# Patient Record
Sex: Female | Born: 1950
Health system: Southern US, Community
[De-identification: ages and names within clinical notes are randomized; demographics above are authoritative.]

## PROBLEM LIST (undated history)

## (undated) DIAGNOSIS — F99 Mental disorder, not otherwise specified: Secondary | ICD-10-CM

## (undated) DIAGNOSIS — I10 Essential (primary) hypertension: Secondary | ICD-10-CM

## (undated) DIAGNOSIS — F32A Depression, unspecified: Secondary | ICD-10-CM

## (undated) DIAGNOSIS — F329 Major depressive disorder, single episode, unspecified: Secondary | ICD-10-CM

## (undated) DIAGNOSIS — F1910 Other psychoactive substance abuse, uncomplicated: Secondary | ICD-10-CM

## (undated) DIAGNOSIS — B1920 Unspecified viral hepatitis C without hepatic coma: Secondary | ICD-10-CM

## (undated) DIAGNOSIS — F1010 Alcohol abuse, uncomplicated: Secondary | ICD-10-CM

## (undated) HISTORY — PX: TUBAL LIGATION: SHX77

## (undated) HISTORY — PX: CHOLECYSTECTOMY: SHX55

---

## 2004-05-11 ENCOUNTER — Emergency Department (HOSPITAL_COMMUNITY): Admission: EM | Admit: 2004-05-11 | Discharge: 2004-05-11 | Payer: Self-pay | Admitting: Emergency Medicine

## 2004-05-12 ENCOUNTER — Emergency Department (HOSPITAL_COMMUNITY): Admission: EM | Admit: 2004-05-12 | Discharge: 2004-05-12 | Payer: Self-pay | Admitting: Emergency Medicine

## 2004-06-09 ENCOUNTER — Emergency Department (HOSPITAL_COMMUNITY): Admission: EM | Admit: 2004-06-09 | Discharge: 2004-06-09 | Payer: Self-pay | Admitting: Emergency Medicine

## 2004-06-17 ENCOUNTER — Emergency Department (HOSPITAL_COMMUNITY): Admission: EM | Admit: 2004-06-17 | Discharge: 2004-06-17 | Payer: Self-pay | Admitting: *Deleted

## 2004-11-08 ENCOUNTER — Emergency Department (HOSPITAL_COMMUNITY): Admission: EM | Admit: 2004-11-08 | Discharge: 2004-11-08 | Payer: Self-pay | Admitting: Emergency Medicine

## 2004-11-21 ENCOUNTER — Emergency Department (HOSPITAL_COMMUNITY): Admission: EM | Admit: 2004-11-21 | Discharge: 2004-11-21 | Payer: Self-pay | Admitting: Emergency Medicine

## 2005-07-04 ENCOUNTER — Emergency Department (HOSPITAL_COMMUNITY): Admission: EM | Admit: 2005-07-04 | Discharge: 2005-07-04 | Payer: Self-pay | Admitting: Emergency Medicine

## 2005-07-08 ENCOUNTER — Emergency Department (HOSPITAL_COMMUNITY): Admission: EM | Admit: 2005-07-08 | Discharge: 2005-07-08 | Payer: Self-pay | Admitting: *Deleted

## 2005-07-21 ENCOUNTER — Emergency Department (HOSPITAL_COMMUNITY): Admission: EM | Admit: 2005-07-21 | Discharge: 2005-07-21 | Payer: Self-pay | Admitting: Emergency Medicine

## 2005-07-23 ENCOUNTER — Emergency Department (HOSPITAL_COMMUNITY): Admission: EM | Admit: 2005-07-23 | Discharge: 2005-07-23 | Payer: Self-pay | Admitting: Emergency Medicine

## 2005-09-20 ENCOUNTER — Emergency Department (HOSPITAL_COMMUNITY): Admission: EM | Admit: 2005-09-20 | Discharge: 2005-09-21 | Payer: Self-pay | Admitting: Emergency Medicine

## 2008-02-14 ENCOUNTER — Emergency Department (HOSPITAL_COMMUNITY): Admission: EM | Admit: 2008-02-14 | Discharge: 2008-02-14 | Payer: Self-pay | Admitting: Emergency Medicine

## 2008-03-04 ENCOUNTER — Ambulatory Visit: Payer: Self-pay | Admitting: Internal Medicine

## 2008-03-04 LAB — CONVERTED CEMR LAB
ALT: 31 units/L (ref 0–35)
AST: 42 units/L — ABNORMAL HIGH (ref 0–37)
Albumin: 4.1 g/dL (ref 3.5–5.2)
Alkaline Phosphatase: 76 units/L (ref 39–117)
BUN: 9 mg/dL (ref 6–23)
Basophils Absolute: 0 10*3/uL (ref 0.0–0.1)
Basophils Relative: 0 % (ref 0–1)
CO2: 25 meq/L (ref 19–32)
Calcium: 9.5 mg/dL (ref 8.4–10.5)
Chloride: 104 meq/L (ref 96–112)
Creatinine, Ser: 0.67 mg/dL (ref 0.40–1.20)
Eosinophils Absolute: 0.1 10*3/uL (ref 0.0–0.7)
Eosinophils Relative: 1 % (ref 0–5)
Glucose, Bld: 81 mg/dL (ref 70–99)
HCT: 40.9 % (ref 36.0–46.0)
Hemoglobin: 14.1 g/dL (ref 12.0–15.0)
Lymphocytes Relative: 42 % (ref 12–46)
Lymphs Abs: 3.8 10*3/uL (ref 0.7–4.0)
MCHC: 34.5 g/dL (ref 30.0–36.0)
MCV: 93.2 fL (ref 78.0–100.0)
Monocytes Absolute: 0.5 10*3/uL (ref 0.1–1.0)
Monocytes Relative: 5 % (ref 3–12)
Neutro Abs: 4.7 10*3/uL (ref 1.7–7.7)
Neutrophils Relative %: 52 % (ref 43–77)
Platelets: 225 10*3/uL (ref 150–400)
Potassium: 4 meq/L (ref 3.5–5.3)
RBC: 4.39 M/uL (ref 3.87–5.11)
RDW: 14.8 % (ref 11.5–15.5)
Sodium: 138 meq/L (ref 135–145)
TSH: 4.645 microintl units/mL — ABNORMAL HIGH (ref 0.350–4.50)
Total Bilirubin: 0.6 mg/dL (ref 0.3–1.2)
Total Protein: 8.5 g/dL — ABNORMAL HIGH (ref 6.0–8.3)
WBC: 9 10*3/uL (ref 4.0–10.5)

## 2008-03-11 ENCOUNTER — Ambulatory Visit: Payer: Self-pay | Admitting: *Deleted

## 2008-03-17 ENCOUNTER — Ambulatory Visit (HOSPITAL_COMMUNITY): Admission: RE | Admit: 2008-03-17 | Discharge: 2008-03-17 | Payer: Self-pay | Admitting: Internal Medicine

## 2008-03-24 ENCOUNTER — Encounter: Payer: Self-pay | Admitting: Family Medicine

## 2008-03-24 ENCOUNTER — Ambulatory Visit: Payer: Self-pay | Admitting: Internal Medicine

## 2008-03-24 LAB — CONVERTED CEMR LAB
Cholesterol: 135 mg/dL (ref 0–200)
HCV Ab: REACTIVE — AB
HDL: 46 mg/dL (ref >39)
Hep A Total Ab: POSITIVE — AB
Hep B Core Total Ab: POSITIVE — AB
Hep B E Ab: POSITIVE — AB
Hep B S Ab: NEGATIVE
Hepatitis B Surface Ag: NEGATIVE
LDL Cholesterol: 76 mg/dL (ref 0–99)
TSH: 3.164 microintl units/mL (ref 0.350–4.50)
Total CHOL/HDL Ratio: 2.9
Triglycerides: 67 mg/dL (ref <150)
VLDL: 13 mg/dL (ref 0–40)
Vitamin B-12: 992 pg/mL — ABNORMAL HIGH (ref 211–911)

## 2008-04-30 ENCOUNTER — Ambulatory Visit: Payer: Self-pay | Admitting: Family Medicine

## 2009-12-13 ENCOUNTER — Ambulatory Visit: Payer: Self-pay | Admitting: Internal Medicine

## 2009-12-13 LAB — CONVERTED CEMR LAB
BUN: 11 mg/dL (ref 6–23)
CO2: 26 meq/L (ref 19–32)
Calcium: 9.3 mg/dL (ref 8.4–10.5)
Chloride: 106 meq/L (ref 96–112)
Cholesterol: 122 mg/dL (ref 0–200)
Creatinine, Ser: 0.84 mg/dL (ref 0.40–1.20)
Glucose, Bld: 71 mg/dL (ref 70–99)
HDL: 40 mg/dL (ref >39)
LDL Cholesterol: 65 mg/dL (ref 0–99)
Magnesium: 1.8 mg/dL (ref 1.5–2.5)
Pap Smear: NEGATIVE
Potassium: 4.2 meq/L (ref 3.5–5.3)
Sodium: 140 meq/L (ref 135–145)
TSH: 2.678 microintl units/mL (ref 0.350–4.500)
Total CHOL/HDL Ratio: 3.1
Triglycerides: 85 mg/dL (ref <150)
VLDL: 17 mg/dL (ref 0–40)

## 2009-12-26 ENCOUNTER — Ambulatory Visit: Payer: Self-pay | Admitting: Internal Medicine

## 2011-05-24 LAB — DIFFERENTIAL
Basophils Absolute: 0.1
Basophils Relative: 1
Eosinophils Absolute: 0.1
Eosinophils Relative: 1
Lymphocytes Relative: 22
Lymphs Abs: 2.5
Monocytes Absolute: 1
Monocytes Relative: 9
Neutro Abs: 7.6
Neutrophils Relative %: 67

## 2011-05-24 LAB — CBC
HCT: 39.5
Hemoglobin: 13.6
MCHC: 34.3
MCV: 95.9
Platelets: 150
RBC: 4.12
RDW: 13.5
WBC: 11.3 — ABNORMAL HIGH

## 2011-05-24 LAB — URINE MICROSCOPIC-ADD ON

## 2011-05-24 LAB — URINALYSIS, ROUTINE W REFLEX MICROSCOPIC
Bilirubin Urine: NEGATIVE
Glucose, UA: NEGATIVE
Ketones, ur: NEGATIVE
Nitrite: NEGATIVE
Protein, ur: NEGATIVE
Specific Gravity, Urine: 1.011
Urobilinogen, UA: 1
pH: 7

## 2011-05-24 LAB — BASIC METABOLIC PANEL
BUN: 7
CO2: 25
Calcium: 9.5
Chloride: 102
Creatinine, Ser: 0.62
GFR calc Af Amer: >60
GFR calc non Af Amer: >60
Glucose, Bld: 124 — ABNORMAL HIGH
Potassium: 4
Sodium: 138

## 2011-10-08 ENCOUNTER — Encounter (HOSPITAL_COMMUNITY): Payer: Self-pay | Admitting: *Deleted

## 2011-10-08 ENCOUNTER — Emergency Department (HOSPITAL_COMMUNITY)
Admission: EM | Admit: 2011-10-08 | Discharge: 2011-10-08 | Disposition: A | Discharge status: Home / Self Care | Payer: Self-pay | Attending: Emergency Medicine | Admitting: Emergency Medicine

## 2011-10-08 DIAGNOSIS — R1013 Epigastric pain: Secondary | ICD-10-CM | POA: Insufficient documentation

## 2011-10-08 DIAGNOSIS — I10 Essential (primary) hypertension: Secondary | ICD-10-CM | POA: Insufficient documentation

## 2011-10-08 DIAGNOSIS — R531 Weakness: Secondary | ICD-10-CM

## 2011-10-08 DIAGNOSIS — R5381 Other malaise: Secondary | ICD-10-CM | POA: Insufficient documentation

## 2011-10-08 DIAGNOSIS — K5900 Constipation, unspecified: Secondary | ICD-10-CM | POA: Insufficient documentation

## 2011-10-08 DIAGNOSIS — R110 Nausea: Secondary | ICD-10-CM | POA: Insufficient documentation

## 2011-10-08 DIAGNOSIS — R42 Dizziness and giddiness: Secondary | ICD-10-CM | POA: Insufficient documentation

## 2011-10-08 HISTORY — DX: Essential (primary) hypertension: I10

## 2011-10-08 LAB — URINALYSIS, ROUTINE W REFLEX MICROSCOPIC
Glucose, UA: NEGATIVE mg/dL
Hgb urine dipstick: NEGATIVE
Specific Gravity, Urine: 1.007 (ref 1.005–1.030)

## 2011-10-08 LAB — DIFFERENTIAL
Eosinophils Relative: 1 % (ref 0–5)
Lymphocytes Relative: 43 % (ref 12–46)
Lymphs Abs: 3.5 10*3/uL (ref 0.7–4.0)
Monocytes Absolute: 0.6 10*3/uL (ref 0.1–1.0)

## 2011-10-08 LAB — POCT I-STAT, CHEM 8
BUN: 7 mg/dL (ref 6–23)
Calcium, Ion: 1.22 mmol/L (ref 1.12–1.32)
Creatinine, Ser: 0.8 mg/dL (ref 0.50–1.10)
TCO2: 28 mmol/L (ref 0–100)

## 2011-10-08 LAB — CBC
HCT: 36.2 % (ref 36.0–46.0)
MCV: 92.1 fL (ref 78.0–100.0)
RBC: 3.93 MIL/uL (ref 3.87–5.11)
WBC: 8.3 10*3/uL (ref 4.0–10.5)

## 2011-10-08 LAB — URINE MICROSCOPIC-ADD ON

## 2011-10-08 MED ORDER — ONDANSETRON HCL 4 MG/2ML IJ SOLN
4.0000 mg | Freq: Once | INTRAMUSCULAR | Status: AC
  Administered 2011-10-08: 4 mg via INTRAVENOUS
  Filled 2011-10-08: qty 2

## 2011-10-08 MED ORDER — SODIUM CHLORIDE 0.9 % IV BOLUS (SEPSIS)
500.0000 mL | Freq: Once | INTRAVENOUS | Status: AC
  Administered 2011-10-08: 500 mL via INTRAVENOUS

## 2011-10-08 MED ORDER — POTASSIUM CHLORIDE CRYS ER 20 MEQ PO TBCR
40.0000 meq | EXTENDED_RELEASE_TABLET | Freq: Once | ORAL | Status: AC
  Administered 2011-10-08: 40 meq via ORAL
  Filled 2011-10-08: qty 2

## 2011-10-08 MED ORDER — TRIAMTERENE-HCTZ 37.5-25 MG PO TABS: 1.0000 | ORAL_TABLET | Freq: Every day | ORAL | Status: DC

## 2011-10-08 NOTE — ED Notes (Signed)
C/o abd pain/burning. Also nausea & constipation. Last BM 1 -1.5 weeks ago. Sx onset 3d ago. Mentions syncope 3d ago, but denies fall. Mentions, "R shoulder/ arm pain since then", denies known injury. Also (denies: fever, vomiting or diarrhea). Alert, NAD, calm, interactive.

## 2011-10-08 NOTE — ED Provider Notes (Signed)
History     CSN: [id number]  Arrival date & time 10/08/11  6295   First MD Initiated Contact with Patient 10/08/11 2027      Chief Complaint  Patient presents with  . Abdominal Pain    (Consider location/radiation/quality/duration/timing/severity/associated sxs/prior treatment) Patient is a 61 y.o. female presenting with abdominal pain. The history is provided by the patient.  Abdominal Pain The primary symptoms of the illness include abdominal pain and nausea. The primary symptoms of the illness do not include fever or vomiting. The current episode started more than 2 days ago. The onset of the illness was gradual. The problem has not changed since onset. The pain came on gradually. The abdominal pain has been unchanged since its onset. The abdominal pain is located in the epigastric region. The abdominal pain is relieved by nothing.  Associated with: She started having lightheadedness following urination 5 days ago, associated with abdominal burning and pain, mild weakness, nausea without vomiting. Symptoms associated with the illness do not include chills. Associated symptoms comments: She denies shortness of breath or chest pain although she reports palpitations with the episodes of lightheadedness. She reports constipation for the past 1 1/2 weeks.. Associated medical issues comments: She has a history of untreated hypertension. .    Past Medical History  Diagnosis Date  . Hypertension     Past Surgical History  Procedure Date  . Cholecystectomy   . Tubal ligation     Family History  Problem Relation Age of Onset  . Diabetes Mother   . Hypertension Mother   . Cancer Father     History  Substance Use Topics  . Smoking status: Current Everyday Smoker -- 1.0 packs/day  . Smokeless tobacco: Not on file  . Alcohol Use: Yes     occaisional    OB History    Grav Para Term Preterm Abortions TAB SAB Ect Mult Living                  Review of Systems  Constitutional:  Negative for fever and chills.  HENT: Negative.   Respiratory: Negative.   Cardiovascular: Negative.   Gastrointestinal: Positive for nausea and abdominal pain. Negative for vomiting.  Musculoskeletal: Negative.   Skin: Negative.   Neurological: Positive for light-headedness.    Allergies  Penicillins  Home Medications   Current Outpatient Rx  Name Route Sig Dispense Refill  . IBUPROFEN 200 MG PO TABS Oral Take 400 mg by mouth daily as needed. For pain.    Marland Kitchen ADVIL PM PO Oral Take 2 tablets by mouth at bedtime as needed. To help with sleep      BP 176/105  Pulse 59  Resp 16  SpO2 100%  Physical Exam  Constitutional: She appears well-developed and well-nourished.  HENT:  Head: Normocephalic.  Neck: Normal range of motion. Neck supple.  Cardiovascular: Normal rate and regular rhythm.   Pulmonary/Chest: Effort normal and breath sounds normal.  Abdominal: Soft. Bowel sounds are normal. There is no rebound and no guarding.       Mild epigastric tenderness. No distention.  Musculoskeletal: Normal range of motion.  Neurological: She is alert. No cranial nerve deficit.  Skin: Skin is warm and dry. No rash noted.  Psychiatric: She has a normal mood and affect.    ED Course  Procedures (including critical care time)  Labs Reviewed - No data to display No results found.  Date: 10/08/2011  Rate: 63  Rhythm: normal sinus rhythm  QRS Axis: normal  Intervals: normal  ST/T Wave abnormalities: nonspecific T wave changes  Conduction Disutrbances:none  Narrative Interpretation:   Old EKG Reviewed: unchanged    No diagnosis found.    MDM          Rodena Medin, PA-C 10/08/11 2302  Rodena Medin, PA-C 10/18/11 [phone]

## 2011-10-08 NOTE — ED Notes (Addendum)
Pt request sprite...cleared by edp Bebe Shaggy

## 2011-10-08 NOTE — Discharge Instructions (Signed)
FOLLOW UP WITH [facility] ([facility]) OR WITH YOUR REGULAR PRIMARY CARE DOCTOR FOR FURTHER MANAGEMENT OF HIGH BLOOD PRESSURE AND FOR RECHECK OF CURRENT SYMPTOMS. RETURN HERE AS NEEDED.  Hypertension As your heart beats, it forces blood through your arteries. This force is your blood pressure. If the pressure is too high, it is called hypertension (HTN) or high blood pressure. HTN is dangerous because you may have it and not know it. High blood pressure may mean that your heart has to work harder to pump blood. Your arteries may be narrow or stiff. The extra work puts you at risk for heart disease, stroke, and other problems.  Blood pressure consists of two numbers, a higher number over a lower, 110/72, for example. It is stated as "110 over 72." The ideal is below 120 for the top number (systolic) and under 80 for the bottom (diastolic). Write down your blood pressure today. You should pay close attention to your blood pressure if you have certain conditions such as:  Heart failure.   Prior heart attack.   Diabetes   Chronic kidney disease.   Prior stroke.   Multiple risk factors for heart disease.  To see if you have HTN, your blood pressure should be measured while you are seated with your arm held at the level of the heart. It should be measured at least twice. A one-time elevated blood pressure reading (especially in the Emergency Department) does not mean that you need treatment. There may be conditions in which the blood pressure is different between your right and left arms. It is important to see your caregiver soon for a recheck. Most people have essential hypertension which means that there is not a specific cause. This type of high blood pressure may be lowered by changing lifestyle factors such as:  Stress.   Smoking.   Lack of exercise.   Excessive weight.   Drug/tobacco/alcohol use.   Eating less salt.  Most people do not have symptoms from high blood  pressure until it has caused damage to the body. Effective treatment can often prevent, delay or reduce that damage. TREATMENT  When a cause has been identified, treatment for high blood pressure is directed at the cause. There are a large number of medications to treat HTN. These fall into several categories, and your caregiver will help you select the medicines that are best for you. Medications may have side effects. You should review side effects with your caregiver. If your blood pressure stays high after you have made lifestyle changes or started on medicines,   Your medication(s) may need to be changed.   Other problems may need to be addressed.   Be certain you understand your prescriptions, and know how and when to take your medicine.   Be sure to follow up with your caregiver within the time frame advised (usually within two weeks) to have your blood pressure rechecked and to review your medications.   If you are taking more than one medicine to lower your blood pressure, make sure you know how and at what times they should be taken. Taking two medicines at the same time can result in blood pressure that is too low.  SEEK IMMEDIATE MEDICAL CARE IF:  You develop a severe headache, blurred or changing vision, or confusion.   You have unusual weakness or numbness, or a faint feeling.   You have severe chest or abdominal pain, vomiting, or breathing problems.  MAKE SURE YOU:   Understand these  instructions.   Will watch your condition.   Will get help right away if you are not doing well or get worse.  Document Released: 08/13/2005 Document Revised: 04/25/2011 Document Reviewed: 04/02/2008 [facility] Patient Information 2012 [place], [place].

## 2011-10-08 NOTE — ED Notes (Signed)
Pt states that she has had burning abdominal pain that radiates to her chest. Pt states that the burning is center chest. Pt has a history of GERD but state that her pain is different than her normal GERD. Pt states that she has felt nauseated with the pain but no vomiting. Pt also noticed that her BP was elevated and it has been high for the past week. Pt is alert and oriented able to follow commands and move extremities.

## 2011-10-08 NOTE — ED Provider Notes (Signed)
Pt seen with PA Here for abd burning, dizziness, but no cp/sob reported on my exam EKG reviewed I doubt ACS at this time She is well appearing, no distress    Joya Gaskins, MD 10/08/11 2149

## 2011-10-18 NOTE — ED Provider Notes (Signed)
Medical screening examination/treatment/procedure(s) were conducted as a shared visit with non-physician practitioner(s) and myself.  I personally evaluated the patient during the encounter   Dana Gaskins, MD 10/18/11 [phone]

## 2012-07-28 ENCOUNTER — Emergency Department (HOSPITAL_COMMUNITY)
Admission: EM | Admit: 2012-07-28 | Discharge: 2012-07-29 | Disposition: A | Discharge status: Other Acute Inpatient Hospital | Payer: Self-pay | Attending: Emergency Medicine | Admitting: Emergency Medicine

## 2012-07-28 ENCOUNTER — Encounter (HOSPITAL_COMMUNITY): Payer: Self-pay | Admitting: Emergency Medicine

## 2012-07-28 ENCOUNTER — Emergency Department (HOSPITAL_COMMUNITY): Payer: Self-pay

## 2012-07-28 DIAGNOSIS — R45851 Suicidal ideations: Secondary | ICD-10-CM | POA: Insufficient documentation

## 2012-07-28 DIAGNOSIS — R079 Chest pain, unspecified: Secondary | ICD-10-CM | POA: Insufficient documentation

## 2012-07-28 DIAGNOSIS — I10 Essential (primary) hypertension: Secondary | ICD-10-CM | POA: Insufficient documentation

## 2012-07-28 DIAGNOSIS — F17200 Nicotine dependence, unspecified, uncomplicated: Secondary | ICD-10-CM | POA: Insufficient documentation

## 2012-07-28 DIAGNOSIS — F1910 Other psychoactive substance abuse, uncomplicated: Secondary | ICD-10-CM | POA: Insufficient documentation

## 2012-07-28 DIAGNOSIS — Z79899 Other long term (current) drug therapy: Secondary | ICD-10-CM | POA: Insufficient documentation

## 2012-07-28 HISTORY — DX: Major depressive disorder, single episode, unspecified: F32.9

## 2012-07-28 HISTORY — DX: Mental disorder, not otherwise specified: F99

## 2012-07-28 HISTORY — DX: Depression, unspecified: F32.A

## 2012-07-28 HISTORY — DX: Other psychoactive substance abuse, uncomplicated: F19.10

## 2012-07-28 HISTORY — DX: Alcohol abuse, uncomplicated: F10.10

## 2012-07-28 LAB — COMPREHENSIVE METABOLIC PANEL
ALT: 59 U/L — ABNORMAL HIGH (ref 0–35)
AST: 85 U/L — ABNORMAL HIGH (ref 0–37)
Albumin: 3.9 g/dL (ref 3.5–5.2)
Alkaline Phosphatase: 84 U/L (ref 39–117)
BUN: 8 mg/dL (ref 6–23)
CO2: 22 mEq/L (ref 19–32)
Chloride: 98 mEq/L (ref 96–112)
Potassium: 3.1 mEq/L — ABNORMAL LOW (ref 3.5–5.1)
Sodium: 136 mEq/L (ref 135–145)
Total Bilirubin: 0.6 mg/dL (ref 0.3–1.2)

## 2012-07-28 LAB — RAPID URINE DRUG SCREEN, HOSP PERFORMED
Amphetamines: NOT DETECTED
Barbiturates: NOT DETECTED
Benzodiazepines: NOT DETECTED
Tetrahydrocannabinol: POSITIVE — AB

## 2012-07-28 LAB — CBC
HCT: 40.1 % (ref 36.0–46.0)
RDW: 13.9 % (ref 11.5–15.5)
WBC: 8.1 10*3/uL (ref 4.0–10.5)

## 2012-07-28 LAB — ACETAMINOPHEN LEVEL: Acetaminophen (Tylenol), Serum: <15 ug/mL (ref 10–30)

## 2012-07-28 LAB — TROPONIN I: Troponin I: <0.3 ng/mL (ref <0.30)

## 2012-07-28 LAB — ETHANOL: Alcohol, Ethyl (B): 33 mg/dL — ABNORMAL HIGH (ref 0–11)

## 2012-07-28 MED ORDER — ALUM & MAG HYDROXIDE-SIMETH 200-200-20 MG/5ML PO SUSP: 30.0000 mL | ORAL | Status: DC | PRN

## 2012-07-28 MED ORDER — TRIAMTERENE-HCTZ 37.5-25 MG PO TABS
1.0000 | ORAL_TABLET | Freq: Every day | ORAL | Status: DC
  Administered 2012-07-29: 1 via ORAL
  Filled 2012-07-28: qty 1

## 2012-07-28 MED ORDER — ONDANSETRON HCL 4 MG PO TABS: 4.0000 mg | ORAL_TABLET | Freq: Three times a day (TID) | ORAL | Status: DC | PRN

## 2012-07-28 MED ORDER — PANTOPRAZOLE SODIUM 40 MG PO TBEC
40.0000 mg | DELAYED_RELEASE_TABLET | Freq: Every day | ORAL | Status: DC
  Administered 2012-07-29: 40 mg via ORAL
  Filled 2012-07-28: qty 1

## 2012-07-28 MED ORDER — ZOLPIDEM TARTRATE 5 MG PO TABS: 5.0000 mg | ORAL_TABLET | Freq: Every evening | ORAL | Status: DC | PRN

## 2012-07-28 MED ORDER — LORAZEPAM 1 MG PO TABS: 1.0000 mg | ORAL_TABLET | Freq: Three times a day (TID) | ORAL | Status: DC | PRN

## 2012-07-28 MED ORDER — ACETAMINOPHEN 325 MG PO TABS: 650.0000 mg | ORAL_TABLET | ORAL | Status: DC | PRN

## 2012-07-28 MED ORDER — IBUPROFEN 200 MG PO TABS
600.0000 mg | ORAL_TABLET | Freq: Three times a day (TID) | ORAL | Status: DC | PRN
  Administered 2012-07-29: 600 mg via ORAL
  Filled 2012-07-28: qty 3

## 2012-07-28 MED ORDER — GABAPENTIN 300 MG PO CAPS
300.0000 mg | ORAL_CAPSULE | Freq: Three times a day (TID) | ORAL | Status: DC
  Administered 2012-07-28 – 2012-07-29 (×3): 300 mg via ORAL
  Filled 2012-07-28 (×4): qty 1

## 2012-07-28 NOTE — ED Notes (Signed)
Report to Amanda Rn.

## 2012-07-28 NOTE — ED Provider Notes (Signed)
History     CSN: [id number]  Arrival date & time 07/28/12  1731   First MD Initiated Contact with Patient 07/28/12 1830      Chief Complaint  Patient presents with  . Medical Clearance    (Consider location/radiation/quality/duration/timing/severity/associated sxs/prior treatment) The history is provided by the patient.   patient in under IVC. She is suicidal. She has a history of attempt. She has been drinking alcohol and smoking crack. She drinks daily. She has had suicidal was recently. No current attempt. She's also had some chest pain or last few days. It began after smoking crack. She is pain-free now.  Past Medical History  Diagnosis Date  . Hypertension   . ETOH abuse   . Substance abuse     Past Surgical History  Procedure Date  . Cholecystectomy   . Tubal ligation     Family History  Problem Relation Age of Onset  . Diabetes Mother   . Hypertension Mother   . Cancer Father     History  Substance Use Topics  . Smoking status: Current Every Day Smoker -- 6.0 packs/day    Types: Cigarettes  . Smokeless tobacco: Not on file  . Alcohol Use: Yes     Comment: occaisionally    OB History    Grav Para Term Preterm Abortions TAB SAB Ect Mult Living                  Review of Systems  Constitutional: Negative for activity change and appetite change.  HENT: Negative for neck stiffness.   Eyes: Negative for pain.  Respiratory: Negative for cough, chest tightness and shortness of breath.   Cardiovascular: Positive for chest pain. Negative for leg swelling.  Gastrointestinal: Negative for nausea, vomiting, abdominal pain and diarrhea.  Genitourinary: Negative for flank pain.  Musculoskeletal: Negative for back pain.  Skin: Negative for rash.  Neurological: Negative for weakness, numbness and headaches.  Psychiatric/Behavioral: Positive for suicidal ideas. Negative for behavioral problems. The patient is not nervous/anxious and is not hyperactive.      Allergies  Penicillins  Home Medications   Current Outpatient Rx  Name  Route  Sig  Dispense  Refill  . GABAPENTIN 300 MG PO CAPS   Oral   Take 300 mg by mouth 3 (three) times daily.         . IBUPROFEN 200 MG PO TABS   Oral   Take 400 mg by mouth daily as needed. For pain.         Marland Kitchen PANTOPRAZOLE SODIUM 40 MG PO TBEC   Oral   Take 40 mg by mouth daily.         . TRIAMTERENE-HCTZ 37.5-25 MG PO TABS   Oral   Take 1 each (1 tablet total) by mouth daily.   30 tablet   0     BP 172/110  Pulse 84  Temp 99.3 F (37.4 C) (Oral)  Resp 20  SpO2 98%  Physical Exam  Nursing note and vitals reviewed. Constitutional: She is oriented to person, place, and time. She appears well-developed and well-nourished.  HENT:  Head: Normocephalic and atraumatic.  Eyes: EOM are normal. Pupils are equal, round, and reactive to light.  Neck: Normal range of motion. Neck supple.  Cardiovascular: Normal rate, regular rhythm and normal heart sounds.   No murmur heard. Pulmonary/Chest: Effort normal and breath sounds normal. No respiratory distress. She has no wheezes. She has no rales.  Abdominal: Soft. Bowel sounds are normal.  She exhibits no distension. There is no tenderness. There is no rebound and no guarding.  Musculoskeletal: Normal range of motion.  Neurological: She is alert and oriented to person, place, and time. No cranial nerve deficit.  Skin: Skin is warm and dry.  Psychiatric: Her speech is normal.       Patient appears depressed    ED Course  Procedures (including critical care time)  Labs Reviewed  CBC - Abnormal; Notable for the following:    MCHC 36.4 (*)     All other components within normal limits  COMPREHENSIVE METABOLIC PANEL - Abnormal; Notable for the following:    Potassium 3.1 (*)     Total Protein 9.4 (*)     AST 85 (*)     ALT 59 (*)     All other components within normal limits  ETHANOL - Abnormal; Notable for the following:    Alcohol,  Ethyl (B) 33 (*)     All other components within normal limits  SALICYLATE LEVEL - Abnormal; Notable for the following:    Salicylate Lvl <2.0 (*)     All other components within normal limits  ACETAMINOPHEN LEVEL  URINE RAPID DRUG SCREEN (HOSP PERFORMED)   No results found.   No diagnosis found.   Date: 07/28/2012  Rate: 84  Rhythm: normal sinus rhythm  QRS Axis: normal  Intervals: PR shortened  ST/T Wave abnormalities: nonspecific T wave changes  Conduction Disutrbances:none  Narrative Interpretation: St changes improved.   Old EKG Reviewed: changes noted    MDM  Patient was substance abuse. She is hypertensive but will be restarted on her medication. She's had some chest pain but has a reassuring EKG. Negative troponins. She appears him medically cleared at this time and will be seen by ACT team       Juliet Rude. Rubin Payor, MD 07/28/12 [phone]
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
## 2012-07-28 NOTE — ED Notes (Signed)
Pt presenting to ed with c/o medical clearance pt states she wants to hurt herself. Pt here with gpd and pt has IVC paperwork. Pt is alert and oriented and cooperative in triage

## 2012-07-28 NOTE — Progress Notes (Signed)
[facility] ED CM assisted pt to get a [place] sandwich, cup of apple juice, and blankets Noted pt with tremor Appreciative of services rendered
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
## 2012-07-29 ENCOUNTER — Inpatient Hospital Stay (HOSPITAL_COMMUNITY)
Admission: AD | Admit: 2012-07-29 | Discharge: 2012-08-06 | DRG: 897 | Disposition: A | Discharge status: Home / Self Care | Payer: No Typology Code available for payment source | Source: Ambulatory Visit | Attending: Psychiatry | Admitting: Psychiatry

## 2012-07-29 ENCOUNTER — Encounter (HOSPITAL_COMMUNITY): Payer: Self-pay | Admitting: *Deleted

## 2012-07-29 DIAGNOSIS — F1410 Cocaine abuse, uncomplicated: Secondary | ICD-10-CM | POA: Diagnosis present

## 2012-07-29 DIAGNOSIS — R51 Headache: Secondary | ICD-10-CM | POA: Diagnosis not present

## 2012-07-29 DIAGNOSIS — H538 Other visual disturbances: Secondary | ICD-10-CM | POA: Diagnosis not present

## 2012-07-29 DIAGNOSIS — F17200 Nicotine dependence, unspecified, uncomplicated: Secondary | ICD-10-CM | POA: Diagnosis present

## 2012-07-29 DIAGNOSIS — F1994 Other psychoactive substance use, unspecified with psychoactive substance-induced mood disorder: Principal | ICD-10-CM | POA: Diagnosis present

## 2012-07-29 DIAGNOSIS — F3289 Other specified depressive episodes: Secondary | ICD-10-CM | POA: Diagnosis present

## 2012-07-29 DIAGNOSIS — F1210 Cannabis abuse, uncomplicated: Secondary | ICD-10-CM | POA: Diagnosis present

## 2012-07-29 DIAGNOSIS — F329 Major depressive disorder, single episode, unspecified: Secondary | ICD-10-CM | POA: Diagnosis present

## 2012-07-29 DIAGNOSIS — F10129 Alcohol abuse with intoxication, unspecified: Secondary | ICD-10-CM | POA: Diagnosis present

## 2012-07-29 DIAGNOSIS — K219 Gastro-esophageal reflux disease without esophagitis: Secondary | ICD-10-CM | POA: Diagnosis present

## 2012-07-29 DIAGNOSIS — Z79899 Other long term (current) drug therapy: Secondary | ICD-10-CM

## 2012-07-29 DIAGNOSIS — I10 Essential (primary) hypertension: Secondary | ICD-10-CM | POA: Diagnosis present

## 2012-07-29 DIAGNOSIS — F32A Depression, unspecified: Secondary | ICD-10-CM | POA: Diagnosis present

## 2012-07-29 MED ORDER — POTASSIUM CHLORIDE CRYS ER 20 MEQ PO TBCR
30.0000 meq | EXTENDED_RELEASE_TABLET | Freq: Once | ORAL | Status: AC
  Administered 2012-07-29: 30 meq via ORAL
  Filled 2012-07-29: qty 2

## 2012-07-29 MED ORDER — IBUPROFEN 200 MG PO TABS
400.0000 mg | ORAL_TABLET | ORAL | Status: DC | PRN
  Administered 2012-07-31 – 2012-08-02 (×2): 400 mg via ORAL
  Filled 2012-07-29: qty 1
  Filled 2012-07-29: qty 2
  Filled 2012-07-29: qty 1

## 2012-07-29 MED ORDER — ACETAMINOPHEN 325 MG PO TABS: 650.0000 mg | ORAL_TABLET | Freq: Four times a day (QID) | ORAL | Status: DC | PRN

## 2012-07-29 MED ORDER — MAGNESIUM HYDROXIDE 400 MG/5ML PO SUSP
30.0000 mL | Freq: Every day | ORAL | Status: DC | PRN
  Administered 2012-07-30: 30 mL via ORAL

## 2012-07-29 MED ORDER — PANTOPRAZOLE SODIUM 40 MG PO TBEC
40.0000 mg | DELAYED_RELEASE_TABLET | Freq: Every day | ORAL | Status: DC
  Administered 2012-07-30 – 2012-08-06 (×8): 40 mg via ORAL
  Filled 2012-07-29 (×10): qty 1

## 2012-07-29 MED ORDER — TRAZODONE HCL 50 MG PO TABS
50.0000 mg | ORAL_TABLET | Freq: Every evening | ORAL | Status: DC | PRN
  Administered 2012-07-29 – 2012-07-30 (×2): 50 mg via ORAL
  Filled 2012-07-29 (×2): qty 1

## 2012-07-29 MED ORDER — TRIAMTERENE-HCTZ 37.5-25 MG PO TABS
1.0000 | ORAL_TABLET | Freq: Every day | ORAL | Status: DC
  Administered 2012-07-30 – 2012-08-06 (×8): 1 via ORAL
  Filled 2012-07-29 (×9): qty 1

## 2012-07-29 MED ORDER — NICOTINE 21 MG/24HR TD PT24
21.0000 mg | MEDICATED_PATCH | Freq: Every day | TRANSDERMAL | Status: DC
  Administered 2012-07-30 – 2012-08-06 (×8): 21 mg via TRANSDERMAL
  Filled 2012-07-29 (×10): qty 1

## 2012-07-29 MED ORDER — ALUM & MAG HYDROXIDE-SIMETH 200-200-20 MG/5ML PO SUSP: 30.0000 mL | ORAL | Status: DC | PRN

## 2012-07-29 MED ORDER — GABAPENTIN 300 MG PO CAPS
300.0000 mg | ORAL_CAPSULE | Freq: Three times a day (TID) | ORAL | Status: DC
  Administered 2012-07-29 – 2012-08-06 (×24): 300 mg via ORAL
  Filled 2012-07-29 (×30): qty 1

## 2012-07-29 NOTE — Clinical Social Work Note (Signed)
CSW received a call from [place] at [facility] who stated that pt K level was 3.1 yesterday and needs to be therapeutic at 3.4 in order to be accepted to [facility].  Minerva Areola stated no re-read needed.  He suggested administering K to the pt.  Once the K has been administered, pt will be accepted.  CSW made RN aware.  Tried to contact EDP - no answer, RN stated she would inform EDP.  Vickii Penna, LCSWA [phone]  Clinical Social Work
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
## 2012-07-29 NOTE — ED Notes (Signed)
Patient complaining of a burning sensation to both feet. Asked patient regarding any history of diabetes or peripheral neuropathy, but denies either. Is noted that she in on gabapentin, yet that and the 600mg  Ibuprofen PO at 1100 has not been helpful.

## 2012-07-29 NOTE — Clinical Social Work Note (Signed)
CSW received a call from RN, Clydie Braun who stated pt ingested K.  CSW called and relayed message to [place] at [facility].  Pt accepted to [facility].  ([id number]) Accepting Dr. Dub Mikes.  CSW called and made EDP and RN aware.  Vickii Penna, LCSWA [phone]  Clinical Social Work

## 2012-07-29 NOTE — Tx Team (Signed)
Initial Interdisciplinary Treatment Plan  PATIENT STRENGTHS: (choose at least two) Ability for insight Average or above average intelligence Capable of independent living Communication skills General fund of knowledge Motivation for treatment/growth Religious Affiliation  PATIENT STRESSORS: Financial difficulties Health problems Marital or family conflict Medication change or noncompliance Substance abuse   PROBLEM LIST: Problem List/Patient Goals Date to be addressed Date deferred Reason deferred Estimated date of resolution  SI 07/29/2012   D/c        Substance abuse     07/29/2012   D/c        depression 07/29/2012   D/c                           DISCHARGE CRITERIA:  Ability to meet basic life and health needs Adequate post-discharge living arrangements Improved stabilization in mood, thinking, and/or behavior Motivation to continue treatment in a less acute level of care Need for constant or close observation no longer present Reduction of life-threatening or endangering symptoms to within safe limits Safe-care adequate arrangements made Verbal commitment to aftercare and medication compliance Withdrawal symptoms are absent or subacute and managed without 24-hour nursing intervention  PRELIMINARY DISCHARGE PLAN: Attend aftercare/continuing care group Attend PHP/IOP Attend 12-step recovery group Outpatient therapy Participate in family therapy Placement in alternative living arrangements  PATIENT/FAMIILY INVOLVEMENT: This treatment plan has been presented to and reviewed with the patient, Dana Mcintosh.  The patient and family have been given the opportunity to ask questions and make suggestions.  Earline Mayotte 07/29/2012, 5:34 PM

## 2012-07-30 DIAGNOSIS — F1210 Cannabis abuse, uncomplicated: Secondary | ICD-10-CM | POA: Diagnosis present

## 2012-07-30 DIAGNOSIS — F10129 Alcohol abuse with intoxication, unspecified: Secondary | ICD-10-CM | POA: Diagnosis present

## 2012-07-30 DIAGNOSIS — F1410 Cocaine abuse, uncomplicated: Secondary | ICD-10-CM | POA: Diagnosis present

## 2012-07-30 MED ORDER — LOPERAMIDE HCL 2 MG PO CAPS: 2.0000 mg | ORAL_CAPSULE | ORAL | Status: DC | PRN

## 2012-07-30 MED ORDER — CHLORDIAZEPOXIDE HCL 25 MG PO CAPS: 25.0000 mg | ORAL_CAPSULE | ORAL | Status: DC

## 2012-07-30 MED ORDER — VITAMIN B-1 100 MG PO TABS
100.0000 mg | ORAL_TABLET | Freq: Every day | ORAL | Status: DC
  Filled 2012-07-30: qty 1

## 2012-07-30 MED ORDER — CHLORDIAZEPOXIDE HCL 25 MG PO CAPS: 25.0000 mg | ORAL_CAPSULE | Freq: Four times a day (QID) | ORAL | Status: DC

## 2012-07-30 MED ORDER — CHLORDIAZEPOXIDE HCL 25 MG PO CAPS: 25.0000 mg | ORAL_CAPSULE | Freq: Every day | ORAL | Status: DC

## 2012-07-30 MED ORDER — FLUOXETINE HCL 10 MG PO CAPS
10.0000 mg | ORAL_CAPSULE | Freq: Every day | ORAL | Status: DC
  Administered 2012-07-30 – 2012-08-06 (×8): 10 mg via ORAL
  Filled 2012-07-30 (×9): qty 1

## 2012-07-30 MED ORDER — CHLORDIAZEPOXIDE HCL 25 MG PO CAPS: 50.0000 mg | ORAL_CAPSULE | Freq: Once | ORAL | Status: DC

## 2012-07-30 MED ORDER — CHLORDIAZEPOXIDE HCL 25 MG PO CAPS
25.0000 mg | ORAL_CAPSULE | Freq: Three times a day (TID) | ORAL | Status: DC | PRN
  Administered 2012-08-02 – 2012-08-03 (×2): 25 mg via ORAL
  Filled 2012-07-30 (×3): qty 1

## 2012-07-30 MED ORDER — HYDROXYZINE HCL 25 MG PO TABS
25.0000 mg | ORAL_TABLET | Freq: Four times a day (QID) | ORAL | Status: AC | PRN
  Administered 2012-07-31 – 2012-08-01 (×2): 25 mg via ORAL

## 2012-07-30 MED ORDER — THIAMINE HCL 100 MG/ML IJ SOLN: 100.0000 mg | Freq: Once | INTRAMUSCULAR | Status: DC

## 2012-07-30 MED ORDER — ADULT MULTIVITAMIN W/MINERALS CH
1.0000 | ORAL_TABLET | Freq: Every day | ORAL | Status: DC
  Filled 2012-07-30 (×2): qty 1

## 2012-07-30 MED ORDER — ONDANSETRON 4 MG PO TBDP: 4.0000 mg | ORAL_TABLET | Freq: Four times a day (QID) | ORAL | Status: AC | PRN

## 2012-07-30 MED ORDER — CHLORDIAZEPOXIDE HCL 25 MG PO CAPS: 25.0000 mg | ORAL_CAPSULE | Freq: Three times a day (TID) | ORAL | Status: DC

## 2012-07-30 MED ORDER — CHLORDIAZEPOXIDE HCL 25 MG PO CAPS: 25.0000 mg | ORAL_CAPSULE | Freq: Four times a day (QID) | ORAL | Status: DC | PRN

## 2012-07-30 NOTE — Progress Notes (Signed)
Patient attended 11 am psycho educational group: This group consisted of the therapeutic ball that contain short and long term goals for treatment and recovery. Patients were challenged to find the good in every situation and not to dwell on the negative decisions that they have made.

## 2012-07-30 NOTE — Progress Notes (Signed)
Pt observed earlier at shift change in the dayroom watching TV.  She reports her day has been ok.  She says she has been attending groups.  She says she is still depressed about her situation and has SI off/on.  She is able to contract for safety.  She denies HI/AV.  Pt wants long term treatment when she is discharged.  She is homeless because her daughter kicked her out and she cannot return to her sister's house.  She says her withdrawal symptoms are minimal.  She is pleasant/cooperative.  Encouraged to make her needs known to staff.  Pt voices understanding.  Support/encouragement given.  Safety maintained with q15 minute checks.

## 2012-07-30 NOTE — Progress Notes (Signed)
D: Pt in bed resting with eyes closed. Respirations even and unlabored. Pt appears to be in no signs of distress at this time. A: Q15min checks remains for this pt. R: Pt remains safe at this time.   

## 2012-07-30 NOTE — Clinical Social Work Note (Signed)
[facility] LCSW Group Therapy  07/30/2012  1:15 PM   Type of Therapy:  Group Therapy  Participation Level:  Active  Participation Quality:  Appropriate, Attentive, Sharing and Supportive  Affect:  Appropriate  Cognitive:  Alert and Appropriate  Insight:  Engaged  Engagement in Therapy:  Engaged  Modes of Intervention:  Clarification, Discussion, Education, Exploration, Problem-solving, Rapport Building, Socialization and Support  Summary of Progress/Problems: The topic for group today was emotional regulation.  Pt participated in the discussion regarding what emotional regulation is and how it affects their life, positive and negative.  Pt discussed coping skills and ways they can regulate their emotions in a positive manner.   Pt discussed that she is angry and lashes out at people often.  Pt states that she has figured out that she is angry at herself for the situation she is in now and takes it out on others.  Pt processed feelings towards her sister who pt states that pt has a way of pushing her buttons.  Pt was confronted by group members and states that she never considered that her sister is controlling her emotions and can not work on how to let this go.    Reyes Ivan, LCSWA 07/30/2012 2:17 PM
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
## 2012-07-30 NOTE — Clinical Social Work Note (Signed)
[facility] LCSW Aftercare Discharge Planning Group Note  07/30/2012 8:45 AM  Participation Quality:  Appropriate, Attentive and Resistant  Affect:  Anxious, Appropriate, Depressed and Flat  Cognitive:  Alert and Appropriate  Insight:  Developing/Improving  Engagement in Group:  Engaged  Modes of Intervention:  Clarification, Discussion, Education, Exploration, Orientation, Problem-solving, Rapport Building, Socialization and Support  Summary of Progress/Problems: Pt attended discharge planning group and actively participated in group.  CSW provided pt with today's workbook.  Pt rates depression and anxiety at an 8-9 today.  Pt denies SI, stating not at this moment.  Pt states that she came to the hospital for SI.  Pt states that she was staying with her sister in [place] but isn't able to return there because it is public housing.  Pt states that she wants to go to long term treatment.  CSW called and left a message to request a date at [facility].  CSW will assess for appropriate referrals.  No further needs voiced by pt at this time.    Reyes Ivan, LCSWA 07/30/2012 10:07 AM
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
## 2012-07-30 NOTE — Progress Notes (Signed)
Patient ID: Dana Mcintosh, female   DOB: 01-26-1951, 61 y.o.   MRN: [id number] She has been up and to groups interacting with peers and staff.  Self inventory: Depressed 9, hopelessness 9, SI on and off contracts for safety. .  Withdrawal symptoms some agitation this AM.

## 2012-07-31 ENCOUNTER — Encounter (HOSPITAL_COMMUNITY): Payer: Self-pay | Admitting: Psychiatry

## 2012-07-31 DIAGNOSIS — F1994 Other psychoactive substance use, unspecified with psychoactive substance-induced mood disorder: Principal | ICD-10-CM

## 2012-07-31 DIAGNOSIS — F329 Major depressive disorder, single episode, unspecified: Secondary | ICD-10-CM | POA: Diagnosis present

## 2012-07-31 DIAGNOSIS — F1010 Alcohol abuse, uncomplicated: Secondary | ICD-10-CM

## 2012-07-31 DIAGNOSIS — F3289 Other specified depressive episodes: Secondary | ICD-10-CM

## 2012-07-31 LAB — HEMOGLOBIN A1C: Hgb A1c MFr Bld: 5.2 % (ref <5.7)

## 2012-07-31 MED ORDER — TRAZODONE HCL 100 MG PO TABS
100.0000 mg | ORAL_TABLET | Freq: Every evening | ORAL | Status: DC | PRN
  Administered 2012-07-31 – 2012-08-02 (×3): 100 mg via ORAL
  Filled 2012-07-31 (×4): qty 1

## 2012-07-31 NOTE — Clinical Social Work Note (Signed)
[facility] LCSW Aftercare Discharge Planning Group Note  07/31/2012 8:45 AM  Participation Quality:  Did Not Attend group  Summary of Progress/Problems: Pt did not attend group, stating that she was in a lot of pain today.  No further needs voiced by pt at this time.    Dana Mcintosh, LCSWA 07/31/2012 9:13 AM
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
## 2012-07-31 NOTE — Progress Notes (Signed)
Patient ID: Dana Mcintosh, female   DOB: 1951-02-28, 61 y.o.   MRN: [id number] She has been up and to groups interacting with peers and staff. Stated that she had not slept well last night even with the sleep medication. Self inventory: Depression 2, Hopelessness 3, denies SI   and withdrawal symptoms.

## 2012-07-31 NOTE — H&P (Signed)
Patient seen and assessed. Agree with above assessment and recommendations. 

## 2012-07-31 NOTE — Progress Notes (Signed)
Psychoeducational Group Note  Date:  07/31/2012 Time:  2000  Group Topic/Focus:  Karaoke Group  Participation Level:  Minimal  Participation Quality:  Appropriate  Affect:  Appropriate  Cognitive:  Alert  Insight:  Supportive  Engagement in Group:  Limited  Additional Comments:    Humberto Seals Monique 07/31/2012, 10:56 PM

## 2012-08-01 LAB — COMPREHENSIVE METABOLIC PANEL
ALT: 48 U/L — ABNORMAL HIGH (ref 0–35)
Albumin: 3.5 g/dL (ref 3.5–5.2)
Alkaline Phosphatase: 79 U/L (ref 39–117)
Calcium: 10.3 mg/dL (ref 8.4–10.5)
GFR calc Af Amer: 71 mL/min — ABNORMAL LOW (ref >90)
Potassium: 4.7 mEq/L (ref 3.5–5.1)
Sodium: 138 mEq/L (ref 135–145)
Total Protein: 8.7 g/dL — ABNORMAL HIGH (ref 6.0–8.3)

## 2012-08-01 MED ORDER — POTASSIUM CHLORIDE CRYS ER 20 MEQ PO TBCR
20.0000 meq | EXTENDED_RELEASE_TABLET | Freq: Every day | ORAL | Status: DC
  Administered 2012-08-02 – 2012-08-06 (×5): 20 meq via ORAL
  Filled 2012-08-01 (×7): qty 1

## 2012-08-01 MED ORDER — POTASSIUM CHLORIDE CRYS ER 20 MEQ PO TBCR
20.0000 meq | EXTENDED_RELEASE_TABLET | Freq: Once | ORAL | Status: AC
  Administered 2012-08-01: 20 meq via ORAL
  Filled 2012-08-01 (×2): qty 1

## 2012-08-01 MED ORDER — POTASSIUM CHLORIDE 20 MEQ PO PACK
20.0000 meq | PACK | Freq: Once | ORAL | Status: DC
  Filled 2012-08-01: qty 1

## 2012-08-01 NOTE — Progress Notes (Signed)
[facility] MD Progress Note  08/01/2012 2:03 PM Dana Mcintosh  MRN:  [id number] Subjective:  Still having a very hard time. She has not have any expressed support from her family. She feels very depressed. Has suicidal thoughts on and off. She is having cramping in her legs (she was low in potassium earlier during her initial assessment) She does not feel she is going to be able to make it outside of a structured place.  Diagnosis:  Alcohol, cocaine, marihuana abuse, Major Depression recurrent  ADL's:  Intact  Sleep: Poor  Appetite:  Fair  Suicidal Ideation:  Plan:  ideas, no plans Intent:  Denies Means:  Denies Homicidal Ideation:  Plan:  Denies Intent:  Denies Means:  Denies AEB (as evidenced by):  Psychiatric Specialty Exam: Review of Systems  Constitutional: Negative.   HENT: Negative.   Eyes: Negative.   Respiratory: Negative.   Cardiovascular: Negative.   Gastrointestinal: Negative.   Genitourinary: Negative.   Musculoskeletal:       Leg cramps  Skin: Negative.   Neurological: Negative.   Endo/Heme/Allergies: Negative.   Psychiatric/Behavioral: Positive for depression and substance abuse.    Blood pressure 115/77, pulse 67, temperature 98.4 F (36.9 C), temperature source Oral, resp. rate 16, height 5\' 6"  (1.676 m), weight 68.04 kg (150 lb), last menstrual period 07/27/1997.Body mass index is 24.21 kg/(m^2).  General Appearance: Disheveled  Eye Contact::  Minimal  Speech:  Clear and Coherent and Slow, not spontaneous  Volume:  Decreased  Mood:  Depressed, Hopeless and Worthless  Affect:  Restricted  Thought Process:  Coherent and Goal Directed  Orientation:  Full (Time, Place, and Person)  Thought Content:  worries, dealing with shame and guilt  Suicidal Thoughts:  Yes.  without intent/plan  Homicidal Thoughts:  No  Memory:  Immediate;   Fair Recent;   Fair Remote;   Fair  Judgement:  Fair  Insight:  Present  Psychomotor Activity:  Decreased  Concentration:   Fair  Recall:  Fair  Akathisia:  No  Handed:  Right  AIMS (if indicated):     Assets:  Desire for Improvement  Sleep:  Number of Hours: 3.5    Current Medications: Current Facility-Administered Medications  Medication Dose Route Frequency Provider Last Rate Last Dose  . acetaminophen (TYLENOL) tablet 650 mg  650 mg Oral Q6H PRN Rachael Fee, MD      . alum & mag hydroxide-simeth (MAALOX/MYLANTA) 200-200-20 MG/5ML suspension 30 mL  30 mL Oral Q4H PRN Rachael Fee, MD      . chlordiazePOXIDE (LIBRIUM) capsule 25 mg  25 mg Oral TID PRN Nanine Means, NP      . FLUoxetine (PROZAC) capsule 10 mg  10 mg Oral Daily Himabindu Ravi, MD   10 mg at 08/01/12 0819  . gabapentin (NEURONTIN) capsule 300 mg  300 mg Oral TID Rachael Fee, MD   300 mg at 08/01/12 0819  . hydrOXYzine (ATARAX/VISTARIL) tablet 25 mg  25 mg Oral Q6H PRN Sanjuana Kava, NP   25 mg at 07/31/12 1554  . ibuprofen (ADVIL,MOTRIN) tablet 400 mg  400 mg Oral Q4H PRN Rachael Fee, MD   400 mg at 07/31/12 1151  . magnesium hydroxide (MILK OF MAGNESIA) suspension 30 mL  30 mL Oral Daily PRN Rachael Fee, MD   30 mL at 07/30/12 1308  . nicotine (NICODERM CQ - dosed in mg/24 hours) patch 21 mg  21 mg Transdermal Q0600 Rachael Fee, MD  21 mg at 08/01/12 0616  . ondansetron (ZOFRAN-ODT) disintegrating tablet 4 mg  4 mg Oral Q6H PRN Sanjuana Kava, NP      . pantoprazole (PROTONIX) EC tablet 40 mg  40 mg Oral Daily Rachael Fee, MD   40 mg at 08/01/12 0819  . potassium chloride (KLOR-CON) packet 20 mEq  20 mEq Oral Once Rachael Fee, MD      . traZODone (DESYREL) tablet 100 mg  100 mg Oral QHS PRN,MR X 1 Rachael Fee, MD   100 mg at 07/31/12 2151  . triamterene-hydrochlorothiazide (MAXZIDE-25) 37.5-25 MG per tablet 1 each  1 each Oral Daily Rachael Fee, MD   1 each at 08/01/12 [id number]    Lab Results:  Results for orders placed during the hospital encounter of 07/29/12 (from the past 48 hour(s))  HEMOGLOBIN A1C     Status: Normal    Collection Time   07/30/12  8:00 PM      Component Value Range Comment   Hemoglobin A1C 5.2  <5.7 %    Mean Plasma Glucose 103  <117 mg/dL     Physical Findings: AIMS: Facial and Oral Movements Muscles of Facial Expression: None, normal Lips and Perioral Area: None, normal Jaw: None, normal Tongue: None, normal,Extremity Movements Upper (arms, wrists, hands, fingers): None, normal Lower (legs, knees, ankles, toes): None, normal, Trunk Movements Neck, shoulders, hips: None, normal, Overall Severity Severity of abnormal movements (highest score from questions above): None, normal Incapacitation due to abnormal movements: None, normal Patient's awareness of abnormal movements (rate only patient's report): No Awareness, Dental Status Current problems with teeth and/or dentures?: No Does patient usually wear dentures?: No  CIWA:  CIWA-Ar Total: 1  COWS:  COWS Total Score: 1   Treatment Plan Summary: Daily contact with patient to assess and evaluate symptoms and progress in treatment Medication management  Plan: Will increase the Prozac to 20           Will order a CMET, and order Potassium Cl. 20 mEq           Coping skills/relapse prevention  Medical Decision Making Problem Points:  Established problem, worsening (2), New problem, with no additional work-up planned (3) and Review of psycho-social stressors (1) Data Points:  Review or order medicine tests (1) Review of medication regiment & side effects (2)  I certify that inpatient services furnished can reasonably be expected to improve the patient's condition.   Ladeidra Borys A 08/01/2012, 2:03 PM

## 2012-08-01 NOTE — Clinical Social Work Note (Signed)
[facility] LCSW Group Therapy  08/01/2012  1:15 PM   Type of Therapy:  Group Therapy  Participation Level:  Active  Participation Quality:  Appropriate and Attentive  Affect:  Appropriate and Depressed  Cognitive:  Alert and Appropriate  Insight:  Developing/Improving  Engagement in Therapy:  Developing/Improving  Modes of Intervention:  Clarification, Confrontation, Discussion, Exploration, Problem-solving, Rapport Building, Socialization and Support  Summary of Progress/Problems: The topic for today was feelings about relapse.  Pt discussed what relapse prevention is to them and identified triggers that they are on the path to relapse.  Pt processed their feeling towards relapse and was able to relate to peers.  Pt discussed coping skills that can be used for relapse prevention.   Pt discussed feeling like she let people down when she relapsed.  Pt processed feelings of disappointment with the group.  Pt states that she now wants to get clean and well for herself, not others.    Reyes Ivan, LCSWA 08/01/2012 1:57 PM

## 2012-08-01 NOTE — Progress Notes (Signed)
[facility] LCSW Group Therapy  08/01/2012 5:41 PM  Type of Therapy:  Group Therapy  Participation Level:  Active  Participation Quality:  Attentive, Sharing and Supportive  Affect:  Anxious and Depressed, tearful  Cognitive:  Alert  Insight:  Engaged  Engagement in Therapy:  Engaged  Modes of Intervention:  Discussion, Exploration, Problem-solving and Support  Summary of Progress/Problems: The topic for group was balance in life.  Pt participated in the discussion about when their life was in balance and out of balance and how this feels.  Pt discussed ways to get back in balance and short term goals they can work on to get where they want to be. Patient discussed increased family conflicts and how this often disturbs the balance in her life. Patient processed need to distance herself from certain family members that cause this shift.  Dana Mcintosh 08/01/2012, 5:41 PM

## 2012-08-01 NOTE — Tx Team (Signed)
Interdisciplinary Treatment Plan Update (Adult)  Date:  08/01/2012  Time Reviewed:  9:41 AM   Progress in Treatment: Attending groups: Yes Participating in groups:  Yes Taking medication as prescribed: Yes Tolerating medication:  Yes Family/Significant othe contact made: No, pt refused  Patient understands diagnosis:  Yes Discussing patient identified problems/goals with staff:  Yes Medical problems stabilized or resolved:  Yes Denies suicidal/homicidal ideation: Yes Issues/concerns per patient self-inventory:  None identified Other: N/A  New problem(s) identified: None Identified  Reason for Continuation of Hospitalization: Anxiety Depression Medication stabilization  Interventions implemented related to continuation of hospitalization: mood stabilization, medication monitoring and adjustment, group therapy and psycho education, suicide risk assessment, collateral contact, aftercare planning, ongoing physician assessments and safety checks q 15 mins  Additional comments: N/A  Estimated length of stay: 3-5 days  Discharge Plan: Pt will follow up at [facility] for further treatment.   New goal(s): N/A  Review of initial/current patient goals per problem list:    1.  Goal(s): Address substance use by completing detox protocol  Met:  Yes  Target date: 4 days  As evidenced by: Pt completed detox protocol on 12/4.   2.  Goal (s): Reduce depressive symptoms from a 10 to a 3  Met:  No  Target date: 3-5 days  As evidenced by: Pt rates at a 7 today.    3.  Goal (s): Reduce anxiety symptoms from a 10 to a 3  Met:  No  Target date:  3-5 days  As evidenced by: Pt rates at a 7 today.    4.  Goal(s): Eliminate SI  Met:  No  Target date:   As evidenced by: Pt denying SI.  Pt continues to endorse on and off SI   Attendees: Patient:     Family:     Physician: Geoffery Lyons, MD 08/01/2012 9:41 AM   Nursing: Alease Frame, RN 08/01/2012 9:41 AM   Clinical  Social Worker:  Reyes Ivan, LCSWA 08/01/2012  9:41 AM   Other: Harold Barban, RN 08/01/2012  9:41 AM   Other:  Verna Czech, LCSW 08/01/2012 9:42 AM   Other:     Other:     Other:      Scribe for Treatment Team:   Reyes Ivan 08/01/2012 9:41 AM

## 2012-08-01 NOTE — Clinical Social Work Note (Signed)
[facility] LCSW Aftercare Discharge Planning Group Note  08/01/2012 8:45 AM  Participation Quality: Did Not Attend group.    Reyes Ivan, LCSWA 08/01/2012 9:06 AM

## 2012-08-01 NOTE — Progress Notes (Signed)
D slept fair last nite, new admit; appetite is improving, energy level is low and ability to pay attention is improving, depressed 7/10 and hopeless 7/10, SI off and on but contracts, not going to group today b/c of cramping in her legs, hollows out loud when she has a cramp, taking meds as ordered by MD, going to Dr for meals A q71min safety checks continue and support offered, encouraged to get up and go to group that she needs the information and relating inithe group plus positive responses from the group, lying in bed resting d/t cramping R patient remains safe on the unit

## 2012-08-01 NOTE — Progress Notes (Signed)
Patient ID: Dana Mcintosh, female   DOB: 1951/03/19, 61 y.o.   MRN: [id number] D: Pt presented with depressed mood and flat affect. Pt attended AA group and  interacted appropriately with peers.  Denies SI/HI/AV and pain. Calm and cooperative with assessment. No acute distressed noted. Pt encouraged to come to staff with any question or concerns  A: Met with pt 1:1. PRN medications administered for sleep. Safety has been maintained with Q15 minutes observation. Supported and encouragement provided.  R: Patient remains safe. She is complaint with medications and group programming. Safety has been maintained Q15 and continue current POC.

## 2012-08-01 NOTE — Progress Notes (Signed)
[facility] Group Notes:  (Counselor/Nursing/MHT/Case Management/Adjunct)  08/01/2012 10:58 AM  Type of Therapy:  Psychoeducational Skills  Participation Level:  Did Not Attend  Dalia Heading 08/01/2012, 10:58 AM

## 2012-08-01 NOTE — Progress Notes (Signed)
Pt is resting in bed with eyes closed. RR WNL, even and unlabored. Level III obs in place for safety. Pt is safe. Lawrence Marseilles

## 2012-08-01 NOTE — Progress Notes (Signed)
Psychoeducational Group Note  Date:  08/01/2012 Time:  2000  Group Topic/Focus:  AA group  Participation Level:  Active  Participation Quality:  Appropriate  Affect:  Appropriate  Cognitive:  Appropriate  Insight:  Engaged  Engagement in Group:  Engaged  Additional CommentsFlonnie Hailstone 08/01/2012, 8:55 PM

## 2012-08-02 DIAGNOSIS — F339 Major depressive disorder, recurrent, unspecified: Secondary | ICD-10-CM

## 2012-08-02 DIAGNOSIS — F1210 Cannabis abuse, uncomplicated: Secondary | ICD-10-CM

## 2012-08-02 DIAGNOSIS — F1410 Cocaine abuse, uncomplicated: Secondary | ICD-10-CM

## 2012-08-02 NOTE — Progress Notes (Signed)
D slept fair last nite, cramping in legs continue but better than the nite before, appetite is improving, energy level is low and ability to pay attention is improving, depressed 5/10 hopeless 8/10, flat and depressed mood, SI+ this morning but encouraged to come to staff is she needs to, has headaches from time to time, taking meds as ordered and going to DR for meals A q17min safety checks continue and support offeered R patient remains safe on the unit

## 2012-08-02 NOTE — Clinical Social Work Note (Signed)
[facility] Group Notes:  (Clinical Social Work)  08/02/2012  10:00-11:00AM  Summary of Progress/Problems:   The main focus of today's process group was for the patient to identify ways in which they have in the past sabotaged their own recovery and reasons they may have done this/what they received from doing it.  We then worked to identify a specific plan to avoid doing this when discharged from the hospital for this admission.  The patient expressed anger at the doctor, and said she would not participate but would sit through the group.  She looked at papers throughout the group, and when called on refused to speak.  She became angry and defensive, and stated, "I do not feel comfortable talking to you.  I have somebody here to talk to."  CSW explained about the reasons we do group work in this hospital, and she kept saying "I know" then refused to speak again.  Type of Therapy:  Group Therapy - Process  Participation Level:  None  Participation Quality:  Inattentive  Affect:  Blunted and Resistant  Cognitive:  Alert  Insight:  Defensive  Engagement in Group:  Resistant  Engagement in Therapy:  Off Topic  Modes of Intervention:  Clarification, Education, Limit-setting, Problem-solving, Socialization, Support and Processing   Ambrose Mantle, LCSW 08/02/2012

## 2012-08-02 NOTE — Progress Notes (Signed)
D.  Pt pleasant on approach, denies complaints other than some sinus discomfort due to allergies and constipation.  Denies SI/HI/hallucinations Interacting appropriately within milieu.  Positive for evening AA group.  A.  Medication given as ordered for allergies and constipation.  Support and encouragement offered R.  Will continue to monitor.

## 2012-08-02 NOTE — Progress Notes (Signed)
[facility] MD Progress Note  08/02/2012 10:03 AM Dana Mcintosh  MRN:  [id number] Subjective:  Dana Mcintosh did not want to talk about why she is here, and pointed to the computer and said it's all in there. She denies that she is having any withdrawal symptoms or cravings for anything other than a cigarette. She endorses that she had some suicidal thoughts this morning, but denies that she had any plan or intent. She denies any auditory or visual hallucinations since she has been on the medication. She denies any homicidal ideation. She reports that she had a poor appetite this morning, but is voraciously eating ice cream during this visit. She also had stated, when this provider went to the day room to see her, that she wanted to grab another patient's breakfast from his lap. She reports that she slept okay, but she was up and down a lot through the night. She refused to talk about what she would like to happen after her discharge, and stated "I know what you're trying to get me to do." She also challenged this provider by asking, "are you a psychiatrist?"  Diagnosis:   Axis I: Alcohol, cocaine, marijuana abuse, Major Depression recurrent Axis II: Deferred Axis III:  Past Medical History  Diagnosis Date  . Hypertension   . ETOH abuse   . Substance abuse   . Mental disorder   . Depression     ADL's:  Intact  Sleep: Fair  Appetite:  Poor  Suicidal Ideation:  Patient endorses thoughts only, denies plan or intent Homicidal Ideation:  Patient denies any thought, plan, or intent AEB (as evidenced by):  Psychiatric Specialty Exam: Review of Systems  Constitutional: Negative.   HENT: Negative.   Eyes: Negative.   Respiratory: Negative.   Cardiovascular: Negative.   Gastrointestinal: Negative.   Genitourinary: Negative.   Musculoskeletal:       Leg pain  Skin: Negative.   Neurological: Negative.   Endo/Heme/Allergies: Negative.   Psychiatric/Behavioral: Positive for depression, suicidal  ideas and substance abuse. Negative for hallucinations. The patient is nervous/anxious and has insomnia.     Blood pressure 109/74, pulse 81, temperature 98.1 F (36.7 C), temperature source Oral, resp. rate 16, height 5\' 6"  (1.676 m), weight 68.04 kg (150 lb), last menstrual period 07/27/1997.Body mass index is 24.21 kg/(m^2).  General Appearance: Disheveled  Eye Contact::  Minimal  Speech:  Clear and Coherent  Volume:  Normal  Mood:  Anxious, Dysphoric and Irritable  Affect:  Congruent  Thought Process:  Irrelevant  Orientation:  Full (Time, Place, and Person)  Thought Content:  Paranoid Ideation  Suicidal Thoughts:  Yes.  without intent/plan  Homicidal Thoughts:  No  Memory:  Immediate;   Good Recent;   Good  Judgement:  Poor  Insight:  Lacking  Psychomotor Activity:  Restlessness  Concentration:  Good  Recall:  Good  Akathisia:  No  Handed:    AIMS (if indicated):     Assets:  Resilience  Sleep:  Number of Hours: 6.25    Current Medications: Current Facility-Administered Medications  Medication Dose Route Frequency Provider Last Rate Last Dose  . acetaminophen (TYLENOL) tablet 650 mg  650 mg Oral Q6H PRN Rachael Fee, MD      . alum & mag hydroxide-simeth (MAALOX/MYLANTA) 200-200-20 MG/5ML suspension 30 mL  30 mL Oral Q4H PRN Rachael Fee, MD      . chlordiazePOXIDE (LIBRIUM) capsule 25 mg  25 mg Oral TID PRN Nanine Means, NP      .  FLUoxetine (PROZAC) capsule 10 mg  10 mg Oral Daily Himabindu Ravi, MD   10 mg at 08/02/12 0748  . gabapentin (NEURONTIN) capsule 300 mg  300 mg Oral TID Rachael Fee, MD   300 mg at 08/02/12 0748  . hydrOXYzine (ATARAX/VISTARIL) tablet 25 mg  25 mg Oral Q6H PRN Sanjuana Kava, NP   25 mg at 08/01/12 1410  . ibuprofen (ADVIL,MOTRIN) tablet 400 mg  400 mg Oral Q4H PRN Rachael Fee, MD   400 mg at 07/31/12 1151  . magnesium hydroxide (MILK OF MAGNESIA) suspension 30 mL  30 mL Oral Daily PRN Rachael Fee, MD   30 mL at 07/30/12 1308  . nicotine  (NICODERM CQ - dosed in mg/24 hours) patch 21 mg  21 mg Transdermal Q0600 Rachael Fee, MD   21 mg at 08/02/12 0645  . ondansetron (ZOFRAN-ODT) disintegrating tablet 4 mg  4 mg Oral Q6H PRN Sanjuana Kava, NP      . pantoprazole (PROTONIX) EC tablet 40 mg  40 mg Oral Daily Rachael Fee, MD   40 mg at 08/02/12 0747  . [COMPLETED] potassium chloride SA (K-DUR,KLOR-CON) CR tablet 20 mEq  20 mEq Oral Once Rachael Fee, MD   20 mEq at 08/01/12 1439  . potassium chloride SA (K-DUR,KLOR-CON) CR tablet 20 mEq  20 mEq Oral Daily Sanjuana Kava, NP   20 mEq at 08/02/12 0747  . traZODone (DESYREL) tablet 100 mg  100 mg Oral QHS PRN,MR X 1 Rachael Fee, MD   100 mg at 08/01/12 2134  . triamterene-hydrochlorothiazide (MAXZIDE-25) 37.5-25 MG per tablet 1 each  1 each Oral Daily Rachael Fee, MD   1 each at 08/02/12 [phone]  . [DISCONTINUED] potassium chloride (KLOR-CON) packet 20 mEq  20 mEq Oral Once Rachael Fee, MD        Lab Results:  Results for orders placed during the hospital encounter of 07/29/12 (from the past 48 hour(s))  COMPREHENSIVE METABOLIC PANEL     Status: Abnormal   Collection Time   08/01/12  8:22 PM      Component Value Range Comment   Sodium 138  135 - 145 mEq/L    Potassium 4.7  3.5 - 5.1 mEq/L    Chloride 102  96 - 112 mEq/L    CO2 29  19 - 32 mEq/L    Glucose, Bld 88  70 - 99 mg/dL    BUN 10  6 - 23 mg/dL    Creatinine, Ser 9.60  0.50 - 1.10 mg/dL    Calcium 45.4  8.4 - 10.5 mg/dL    Total Protein 8.7 (*) 6.0 - 8.3 g/dL    Albumin 3.5  3.5 - 5.2 g/dL    AST 66 (*) 0 - 37 U/L    ALT 48 (*) 0 - 35 U/L    Alkaline Phosphatase 79  39 - 117 U/L    Total Bilirubin 0.3  0.3 - 1.2 mg/dL    GFR calc non Af Amer 61 (*) >90 mL/min    GFR calc Af Amer 71 (*) >90 mL/min     Physical Findings: AIMS: Facial and Oral Movements Muscles of Facial Expression: None, normal Lips and Perioral Area: None, normal Jaw: None, normal Tongue: None, normal,Extremity Movements Upper (arms,  wrists, hands, fingers): None, normal Lower (legs, knees, ankles, toes): None, normal, Trunk Movements Neck, shoulders, hips: None, normal, Overall Severity Severity of abnormal movements (highest score from questions  above): None, normal Incapacitation due to abnormal movements: None, normal Patient's awareness of abnormal movements (rate only patient's report): No Awareness, Dental Status Current problems with teeth and/or dentures?: No Does patient usually wear dentures?: No  CIWA:  CIWA-Ar Total: 1  COWS:  COWS Total Score: 1   Treatment Plan Summary: Daily contact with patient to assess and evaluate symptoms and progress in treatment Medication management We will continue her current plan of care, and research options for followup treatment.  Plan:  Medical Decision Making Problem Points:  Established problem, stable/improving (1) and Review of last therapy session (1) Data Points:  Review or order clinical lab tests (1) Review and summation of old records (2) Review of medication regiment & side effects (2)  I certify that inpatient services furnished can reasonably be expected to improve the patient's condition.   Taahir Grisby 08/02/2012, 10:03 AM

## 2012-08-03 MED ORDER — HYDROXYZINE HCL 25 MG PO TABS
25.0000 mg | ORAL_TABLET | Freq: Four times a day (QID) | ORAL | Status: DC | PRN
  Administered 2012-08-04: 25 mg via ORAL
  Filled 2012-08-03: qty 20

## 2012-08-03 MED ORDER — MAGNESIUM CITRATE PO SOLN
1.0000 | Freq: Once | ORAL | Status: AC
  Administered 2012-08-04: 1 via ORAL

## 2012-08-03 MED ORDER — MIRTAZAPINE 15 MG PO TABS
15.0000 mg | ORAL_TABLET | Freq: Every day | ORAL | Status: DC
  Administered 2012-08-03 – 2012-08-04 (×2): 15 mg via ORAL
  Filled 2012-08-03 (×3): qty 1

## 2012-08-03 NOTE — Progress Notes (Signed)
D.  Pt pleasant and bright on approach.  Denies complaints other than continued constipation.  Interacting appropriately within milieu.  Denies SI/HI/hallucinations at this time.  Denies s/s of withdrawal.  A.  Support and encouragement offered, doctor on call notified of constipation and orders received.  R.  Will continue to monitor.  No distress noted

## 2012-08-03 NOTE — Progress Notes (Signed)
Patient did attend the evening speaker AA meeting.  

## 2012-08-03 NOTE — Progress Notes (Signed)
D slept poorly last nite, appetite is improving, energy level is low and ability to pay attention is improving, depressed 9/10 and hopeless 1010, is Si today but contracts for safety, WD s/s include agitation and blurred vision, attending group and participating, is not wanting to talk to staff about why she is here, does not feel comfortable, taking meds as ordered by MD and going to DR for meals. A q97min safety checks continue and support offered, encouraged to talk and discuss her situation in group if she can and feels comfortable, but no pushback from staff R patient remains safe on the unit

## 2012-08-03 NOTE — Progress Notes (Signed)
Psychoeducational Group Note  Date:  08/03/2012 Time:  1315  Group Topic/Focus:  Coping With Mental Health Crisis:   The purpose of this group is to help patients identify strategies for coping with mental health crisis.  Group discusses possible causes of crisis and ways to manage them effectively.  Participation Level:  Active  Participation Quality:  Sharing  Affect:  Appropriate  Cognitive:  Appropriate  Insight:  Engaged  Engagement in Group:  Engaged  Additional Comments:  Participated well in group, disclosed about self and how she feels and coping skiills used  Roselee Culver 08/03/2012, 9:10 AM

## 2012-08-03 NOTE — Progress Notes (Signed)
The focus of this group is to help patients establish daily goals to achieve during treatment and discuss how the patient can incorporate goal setting into their daily lives to aide in recovery. 08/03/12 16109 Patient did not attend group

## 2012-08-03 NOTE — Progress Notes (Signed)
The focus of this group is to help patients establish daily goals to achieve during treatment and discuss how the patient can incorporate goal setting into their daily lives to aide in recovery. Patient attended and particiapted 08/02/12 0830

## 2012-08-03 NOTE — Clinical Social Work Note (Signed)
[facility] Group Notes: (Clinical Social Work)   08/03/2012  10-11am   Type of Therapy:  Group Therapy   Participation Level:  Did Not Attend    Ambrose Mantle, LCSW 08/03/2012, 11:20 AM

## 2012-08-03 NOTE — Progress Notes (Signed)
[facility] MD Progress Note  08/03/2012 11:34 AM Dana Mcintosh  MRN:  [id number] Subjective:  Kendyl reports she is feeling somewhat better today. She denies any suicidal or homicidal ideation. She denies any auditory or visual hallucinations. She denies any cravings for substances but is experiencing some dizziness that she attributes to withdrawal. Her appetite is good. She complains that her sleep was poor and she experiences nightmares last night. She continues to express a significant amount of anxiety regarding her future, and states that she doesn't want to have to go through this again. She also endorses depressed mood.  Diagnosis:   Axis I: Alcohol, cocaine, marijuana abuse, Major Depression recurrent Axis II: Deferred Axis III:  Past Medical History  Diagnosis Date  . Hypertension   . ETOH abuse   . Substance abuse   . Mental disorder   . Depression     ADL's:  Intact  Sleep: Fair  Appetite:  Good  Suicidal Ideation:  Patient denies any thought, plan, or intent Homicidal Ideation:  Patient denies any thought, plan, or intent AEB (as evidenced by):  Psychiatric Specialty Exam: Review of Systems  Constitutional: Negative.   HENT: Negative.   Eyes: Negative.   Respiratory: Negative.   Cardiovascular: Negative.   Gastrointestinal: Negative.   Genitourinary: Negative.   Musculoskeletal: Negative.   Skin: Negative.   Neurological: Positive for dizziness.  Endo/Heme/Allergies: Negative.   Psychiatric/Behavioral: Positive for depression and substance abuse. Negative for suicidal ideas and hallucinations. The patient is nervous/anxious and has insomnia.     Blood pressure 93/66, pulse 85, temperature 98 F (36.7 C), temperature source Oral, resp. rate 16, height 5\' 6"  (1.676 m), weight 68.04 kg (150 lb), last menstrual period 07/27/1997.Body mass index is 24.21 kg/(m^2).  General Appearance: Disheveled  Eye Contact::  Minimal  Speech:  Clear and Coherent  Volume:   Decreased  Mood:  Anxious and Depressed  Affect:  Congruent and Tearful  Thought Process:  Logical  Orientation:  Full (Time, Place, and Person)  Thought Content:  WDL  Suicidal Thoughts:  No  Homicidal Thoughts:  No  Memory:  Immediate;   Good Recent;   Good Remote;   Good  Judgement:  Fair  Insight:  Fair  Psychomotor Activity:  Normal  Concentration:  Good  Recall:  Good  Akathisia:  No  Handed:    AIMS (if indicated):     Assets:  Communication Skills Desire for Improvement  Sleep:  Number of Hours: 4.75    Current Medications: Current Facility-Administered Medications  Medication Dose Route Frequency Provider Last Rate Last Dose  . acetaminophen (TYLENOL) tablet 650 mg  650 mg Oral Q6H PRN Rachael Fee, MD      . alum & mag hydroxide-simeth (MAALOX/MYLANTA) 200-200-20 MG/5ML suspension 30 mL  30 mL Oral Q4H PRN Rachael Fee, MD      . chlordiazePOXIDE (LIBRIUM) capsule 25 mg  25 mg Oral TID PRN Nanine Means, NP   25 mg at 08/02/12 2124  . FLUoxetine (PROZAC) capsule 10 mg  10 mg Oral Daily Himabindu Ravi, MD   10 mg at 08/03/12 0756  . gabapentin (NEURONTIN) capsule 300 mg  300 mg Oral TID Rachael Fee, MD   300 mg at 08/03/12 0756  . [EXPIRED] hydrOXYzine (ATARAX/VISTARIL) tablet 25 mg  25 mg Oral Q6H PRN Sanjuana Kava, NP   25 mg at 08/01/12 1410  . ibuprofen (ADVIL,MOTRIN) tablet 400 mg  400 mg Oral Q4H PRN Rachael Fee,  MD   400 mg at 08/02/12 1810  . magnesium hydroxide (MILK OF MAGNESIA) suspension 30 mL  30 mL Oral Daily PRN Rachael Fee, MD   30 mL at 07/30/12 1308  . mirtazapine (REMERON) tablet 15 mg  15 mg Oral QHS Jorje Guild, PA-C      . nicotine (NICODERM CQ - dosed in mg/24 hours) patch 21 mg  21 mg Transdermal Q0600 Rachael Fee, MD   21 mg at 08/03/12 5409  . [EXPIRED] ondansetron (ZOFRAN-ODT) disintegrating tablet 4 mg  4 mg Oral Q6H PRN Sanjuana Kava, NP      . pantoprazole (PROTONIX) EC tablet 40 mg  40 mg Oral Daily Rachael Fee, MD   40 mg at  08/03/12 0756  . potassium chloride SA (K-DUR,KLOR-CON) CR tablet 20 mEq  20 mEq Oral Daily Sanjuana Kava, NP   20 mEq at 08/03/12 0756  . triamterene-hydrochlorothiazide (MAXZIDE-25) 37.5-25 MG per tablet 1 each  1 each Oral Daily Rachael Fee, MD   1 each at 08/03/12 [phone]  . [DISCONTINUED] traZODone (DESYREL) tablet 100 mg  100 mg Oral QHS PRN,MR X 1 Rachael Fee, MD   100 mg at 08/02/12 2124    Lab Results:  Results for orders placed during the hospital encounter of 07/29/12 (from the past 48 hour(s))  COMPREHENSIVE METABOLIC PANEL     Status: Abnormal   Collection Time   08/01/12  8:22 PM      Component Value Range Comment   Sodium 138  135 - 145 mEq/L    Potassium 4.7  3.5 - 5.1 mEq/L    Chloride 102  96 - 112 mEq/L    CO2 29  19 - 32 mEq/L    Glucose, Bld 88  70 - 99 mg/dL    BUN 10  6 - 23 mg/dL    Creatinine, Ser 1.47  0.50 - 1.10 mg/dL    Calcium 82.9  8.4 - 10.5 mg/dL    Total Protein 8.7 (*) 6.0 - 8.3 g/dL    Albumin 3.5  3.5 - 5.2 g/dL    AST 66 (*) 0 - 37 U/L    ALT 48 (*) 0 - 35 U/L    Alkaline Phosphatase 79  39 - 117 U/L    Total Bilirubin 0.3  0.3 - 1.2 mg/dL    GFR calc non Af Amer 61 (*) >90 mL/min    GFR calc Af Amer 71 (*) >90 mL/min     Physical Findings: AIMS: Facial and Oral Movements Muscles of Facial Expression: None, normal Lips and Perioral Area: None, normal Jaw: None, normal Tongue: None, normal,Extremity Movements Upper (arms, wrists, hands, fingers): None, normal Lower (legs, knees, ankles, toes): None, normal, Trunk Movements Neck, shoulders, hips: None, normal, Overall Severity Severity of abnormal movements (highest score from questions above): None, normal Incapacitation due to abnormal movements: None, normal Patient's awareness of abnormal movements (rate only patient's report): No Awareness, Dental Status Current problems with teeth and/or dentures?: No Does patient usually wear dentures?: No  CIWA:  CIWA-Ar Total: 4  COWS:  COWS  Total Score: 0   Treatment Plan Summary: Daily contact with patient to assess and evaluate symptoms and progress in treatment Medication management  Plan:  Medical Decision Making Problem Points:  Established problem, stable/improving (1), Review of last therapy session (1) and Review of psycho-social stressors (1) Data Points:  Review and summation of old records (2) Review of new medications or change in  dosage (2)  I certify that inpatient services furnished can reasonably be expected to improve the patient's condition.   Jafet Wissing 08/03/2012, 11:34 AM

## 2012-08-04 DIAGNOSIS — F1910 Other psychoactive substance abuse, uncomplicated: Secondary | ICD-10-CM

## 2012-08-04 NOTE — Progress Notes (Signed)
Psychoeducational Group Note  Date:  08/04/2012 Time:  1100am  Group Topic/Focus:  Self Care:   The focus of this group is to help patients understand the importance of self-care in order to improve or restore emotional, physical, spiritual, interpersonal, and financial health.  Participation Level:  Did Not Attend  Additional Comments:  Patient did not attend.   Ardelle Park O 08/04/2012, 3:14 PM

## 2012-08-04 NOTE — Progress Notes (Signed)
Psychoeducational Group Note  Date:  08/04/2012 Time:  2000  Group Topic/Focus:  AA-Addiction  Participation Level:  Minimal  Participation Quality:  Appropriate  Affect:  Flat  Cognitive:  Alert  Insight:    Engagement in Group:  Limited  Additional Comments:    Humberto Seals [facility] 08/04/2012, 10:25 PM[facility] Assessment Progress Note

## 2012-08-04 NOTE — Clinical Social Work Note (Signed)
[facility] Adult Inpatient Family/Significant Other Suicide Prevention Education  Suicide Prevention Education:   Patient Refusal for Family/Significant Other Suicide Prevention Education: The patient has refused to provide written consent for family/significant other to be provided Family/Significant Other Suicide Prevention Education during admission and/or prior to discharge.  Physician notified.  CSW provided suicide prevention information with patient.    The suicide prevention education provided includes the following:  Suicide risk factors  Suicide prevention and interventions  National Suicide Hotline telephone number  [facility] assessment telephone number  [facility] Emergency Assistance 911  [facility] and/or Residential Mobile Crisis Unit telephone number   Reyes Ivan, [place] 08/04/2012 9:33 AM

## 2012-08-04 NOTE — Progress Notes (Addendum)
[facility] MD Progress Note  08/04/2012 4:22 PM Dana Mcintosh  MRN:  [id number] Subjective:  Patient complained of a headache with double vision Diagnosis:   Axis I: Depressive Disorder NOS and Substance Abuse Axis II: Deferred Axis III:  Past Medical History  Diagnosis Date  . Hypertension   . ETOH abuse   . Substance abuse   . Mental disorder   . Depression    Axis IV: economic problems, other psychosocial or environmental problems, problems related to social environment and problems with primary support group Axis V: 41-50 serious symptoms  ADL's:  Intact  Sleep: Fair  Appetite:  Fair  Suicidal Ideation:  Denies Homicidal Ideation:  Denies  Psychiatric Specialty Exam: Review of Systems  Constitutional: Negative.   Eyes: Positive for double vision.  Respiratory: Negative.   Cardiovascular: Negative.   Gastrointestinal: Negative.   Genitourinary: Negative.   Musculoskeletal: Negative.   Skin: Negative.   Neurological: Positive for headaches.  Endo/Heme/Allergies: Negative.   Psychiatric/Behavioral: Positive for depression and substance abuse.    Blood pressure 100/72, pulse 90, temperature 98.1 F (36.7 C), temperature source Oral, resp. rate 16, height 5\' 6"  (1.676 m), weight 68.04 kg (150 lb), last menstrual period 07/27/1997.Body mass index is 24.21 kg/(m^2).  General Appearance: Casual  Eye Contact::  Fair  Speech:  Slow  Volume:  Decreased  Mood:  Depressed  Affect:  Congruent  Thought Process:  Logical  Orientation:  Full (Time, Place, and Person)  Thought Content:  WDL  Suicidal Thoughts:  No  Homicidal Thoughts:  No  Memory:  Immediate;   Fair Recent;   Fair Remote;   Fair  Judgement:  Fair  Insight:  Fair  Psychomotor Activity:  Decreased  Concentration:  Fair  Recall:  Fair  Akathisia:  No  Handed:  Right  AIMS (if indicated):     Assets:  Communication Skills Desire for Improvement  Sleep:  Number of Hours: 5.5    Current  Medications: Current Facility-Administered Medications  Medication Dose Route Frequency Provider Last Rate Last Dose  . acetaminophen (TYLENOL) tablet 650 mg  650 mg Oral Q6H PRN Rachael Fee, MD      . alum & mag hydroxide-simeth (MAALOX/MYLANTA) 200-200-20 MG/5ML suspension 30 mL  30 mL Oral Q4H PRN Rachael Fee, MD      . chlordiazePOXIDE (LIBRIUM) capsule 25 mg  25 mg Oral TID PRN Nanine Means, NP   25 mg at 08/03/12 2123  . FLUoxetine (PROZAC) capsule 10 mg  10 mg Oral Daily Himabindu Ravi, MD   10 mg at 08/04/12 0810  . gabapentin (NEURONTIN) capsule 300 mg  300 mg Oral TID Rachael Fee, MD   300 mg at 08/04/12 1201  . hydrOXYzine (ATARAX/VISTARIL) tablet 25 mg  25 mg Oral Q6H PRN Jorje Guild, PA-C      . ibuprofen (ADVIL,MOTRIN) tablet 400 mg  400 mg Oral Q4H PRN Rachael Fee, MD   400 mg at 08/02/12 1810  . magnesium citrate solution 1 Bottle  1 Bottle Oral Once Jorje Guild, PA-C      . magnesium hydroxide (MILK OF MAGNESIA) suspension 30 mL  30 mL Oral Daily PRN Rachael Fee, MD   30 mL at 07/30/12 1308  . mirtazapine (REMERON) tablet 15 mg  15 mg Oral QHS Jorje Guild, PA-C   15 mg at 08/03/12 2122  . nicotine (NICODERM CQ - dosed in mg/24 hours) patch 21 mg  21 mg Transdermal Q0600 Rachael Fee,  MD   21 mg at 08/04/12 0605  . pantoprazole (PROTONIX) EC tablet 40 mg  40 mg Oral Daily Rachael Fee, MD   40 mg at 08/04/12 0810  . potassium chloride SA (K-DUR,KLOR-CON) CR tablet 20 mEq  20 mEq Oral Daily Sanjuana Kava, NP   20 mEq at 08/04/12 0810  . triamterene-hydrochlorothiazide (MAXZIDE-25) 37.5-25 MG per tablet 1 each  1 each Oral Daily Rachael Fee, MD   1 each at 08/04/12 [phone]    Lab Results: No results found for this or any previous visit (from the past 48 hour(s)).  Physical Findings: AIMS: Facial and Oral Movements Muscles of Facial Expression: None, normal Lips and Perioral Area: None, normal Jaw: None, normal Tongue: None, normal,Extremity Movements Upper (arms, wrists,  hands, fingers): None, normal Lower (legs, knees, ankles, toes): None, normal, Trunk Movements Neck, shoulders, hips: None, normal, Overall Severity Severity of abnormal movements (highest score from questions above): None, normal Incapacitation due to abnormal movements: None, normal Patient's awareness of abnormal movements (rate only patient's report): No Awareness, Dental Status Current problems with teeth and/or dentures?: No Does patient usually wear dentures?: No  CIWA:  CIWA-Ar Total: 4  COWS:  COWS Total Score: 0   Treatment Plan Summary: Daily contact with patient to assess and evaluate symptoms and progress in treatment Medication management  Plan:  Patient complained of a headache and "seeing double", thinks it may be related to the headache, walked patient to the medication room for her to get a PRN medication for her headache, later assessed the patient and her headache resolved but her vision is still double, Dana Mcintosh stated she is not eating well and encouraged her to eat and she said she would eat "a good dinner",  Patient is in the gym at this time, depression was ranked a 5/10 earlier when she had a headache, depressed affect, concentration fair, denies suicidal/homicidal ideations or hallucinations, no other complaints, will continue to monitor her progress.  Medical Decision Making Problem Points:  Established problem, stable/improving (1) and Review of psycho-social stressors (1) Data Points:  Review of medication regiment & side effects (2)  I certify that inpatient services furnished can reasonably be expected to improve the patient's condition.   Nanine Means, PMH-NP 08/04/2012, 4:22 PM

## 2012-08-04 NOTE — Clinical Social Work Note (Signed)
[facility] LCSW Group Therapy  08/04/2012  1:15 PM   Type of Therapy:  Group Therapy  Participation Level:  Did Not Attend group on overcoming obstacles  Dorlis Judice Horton, LCSWA 08/04/2012 2:58 PM   

## 2012-08-04 NOTE — Clinical Social Work Note (Signed)
[facility] LCSW Aftercare Discharge Planning Group Note  08/04/2012 8:45 AM  Participation Quality:  Appropriate and Attentive  Affect:  Depressed and Flat  Cognitive:  Alert and Appropriate  Insight:  Developing/Improving  Engagement in Group:  Developing/Improving  Modes of Intervention:  Clarification, Discussion, Education, Exploration, Orientation, Problem-solving, Rapport Building, Socialization and Support  Summary of Progress/Problems: Pt attended discharge planning group and actively participated in group.  CSW provided pt with today's workbook.  Pt rates depression and anxiety at a 3 today.  Pt endorses on and off SI.  Pt states that she doesn't feel well today.  Pt's d/c plan is to still go to [facility] on 12/16.  No further needs voiced by pt at this time.  Safety planning and suicide prevention discussed.  Pt participated in discussion and acknowledged an understanding of the information provided.       Dana Mcintosh, LCSWA 08/04/2012 9:34 AM

## 2012-08-04 NOTE — Progress Notes (Signed)
Pt observed in the dayroom watching TV.  Not much interaction with peers.  Pt reports she is doing fine today.  She said she is having some congestion and doesn't feel well because of that.  While talking with pt, CN came in to talk with patients about the TV programs for the night and see who wanted to watch the football game.  Pt suddenly became argumentative because she was asked to go to another dayroom because she wanted to watch something else.  The other pts decided to go to another dayroom to watch football so that she would not have to leave the hall.  Pt was able to calm down almost immediately.  Otherwise pt is cooperative.  Pt denies SI/HI/AV.  She reports minimal withdrawal symptoms.  She wants long term treatment when she is discharged.  Referral has been made to [facility].  Pt makes her needs known to staff.  Safety maintained with q15 minute checks.

## 2012-08-04 NOTE — Tx Team (Signed)
Interdisciplinary Treatment Plan Update (Adult)  Date:  08/04/2012  Time Reviewed:  10:48 AM   Progress in Treatment: Attending groups: Yes Participating in groups:  Yes Taking medication as prescribed: Yes Tolerating medication:  Yes Family/Significant othe contact made:  No, pt refused Patient understands diagnosis:  Yes Discussing patient identified problems/goals with staff:  Yes Medical problems stabilized or resolved:  Yes Denies suicidal/homicidal ideation: Yes Issues/concerns per patient self-inventory:  None identified Other: N/A  New problem(s) identified: None Identified  Reason for Continuation of Hospitalization: Anxiety Depression Medication stabilization  Interventions implemented related to continuation of hospitalization: mood stabilization, medication monitoring and adjustment, group therapy and psycho education, suicide risk assessment, collateral contact, aftercare planning, ongoing physician assessments and safety checks q 15 mins  Additional comments: N/A  Estimated length of stay: 2-3 days  Discharge Plan: Pt is scheduled to go to [facility] on 12/16 for further treatment.    New goal(s): N/A  Review of initial/current patient goals per problem list:    1.  Goal(s): Address substance use by completing detox protocol  Met:  Yes  Target date: 4 days  As evidenced by: Pt completed detox protocol.   2.  Goal (s): Reduce depressive symptoms from a 10 to a 3  Met:  Yes  Target date: 3-5 days  As evidenced by: Pt rates at a 3 today  3.  Goal (s): Reduce anxiety symptoms from a 10 to a 3  Met:  Yes  Target date:  3-5 days  As evidenced by: Pt rates at a 3 today.    4.  Goal(s): Eliminate SI  Met:  No  Target date:   As evidenced by: pt denying SI.  Pt continues to endorse on and off SI.     Attendees: Patient:     Family:     Physician: Geoffery Lyons, MD 08/04/2012 10:48 AM   Nursing: Alease Frame, RN  08/04/2012 10:48 AM    Clinical Social Worker:  Reyes Ivan, LCSWA 08/04/2012  10:48 AM   Other: Robbie Louis, RN 08/04/2012  10:48 AM   Other:  Nanine Means, NP 08/04/2012 10:51 AM   Other:     Other:     Other:      Scribe for Treatment Team:   Reyes Ivan 08/04/2012 10:48 AM

## 2012-08-05 MED ORDER — GABAPENTIN 300 MG PO CAPS
300.0000 mg | ORAL_CAPSULE | Freq: Every day | ORAL | Status: DC
  Administered 2012-08-05 (×2): 300 mg via ORAL
  Filled 2012-08-05 (×3): qty 1

## 2012-08-05 MED ORDER — TRAZODONE HCL 50 MG PO TABS
50.0000 mg | ORAL_TABLET | Freq: Every evening | ORAL | Status: DC | PRN
  Administered 2012-08-05: 50 mg via ORAL
  Filled 2012-08-05: qty 1
  Filled 2012-08-05: qty 14

## 2012-08-05 NOTE — Clinical Social Work Note (Signed)
[facility] LCSW Group Therapy  08/05/2012  1:15 PM   Type of Therapy:  Group Therapy  Participation Level:  Did Not Attend with speaker from mental health association  Dana Mcintosh, LCSWA 08/05/2012 2:16 PM

## 2012-08-05 NOTE — Progress Notes (Signed)
Psychoeducational Group Note  Date:  08/05/2012 Time:  2000  Group Topic/Focus:  AA meeting   Participation Level:  Active  Participation Quality:  Appropriate  Affect:  Appropriate  Cognitive:  Appropriate  Insight:  Engaged  Engagement in Group:  Engaged  Additional Comments:  Pt attended AA meeting this evening.   Donica Derouin A 08/05/2012, 9:42 PM

## 2012-08-05 NOTE — Progress Notes (Signed)
D:  Patient up and in the milieu.  Attending groups.  Tolerating medications well.  A:  Medications given as per orders.   R:  Pleasant and cooperative.  Interacting well with staff and peers.

## 2012-08-05 NOTE — Progress Notes (Signed)
[facility] Group Notes:  (Counselor/Nursing/MHT/Case Management/Adjunct)  08/05/2012 1:03 PM  Type of Therapy:  Psychoeducational Skills  Participation Level:  Did Not Attend  Summary of Progress/Problems: Dana Mcintosh c/o having a headache and did not attend a psychoeducational group that focused on using quality time with support systems/indivudals to engage in healthy coping skills.    Wandra Scot 08/05/2012, 1:03 PM

## 2012-08-05 NOTE — Clinical Social Work Note (Signed)
[facility] LCSW Aftercare Discharge Planning Group Note  08/05/2012 8:45 AM  Participation Quality:  Did Not Attend   Donnella Morford Horton, LCSWA 08/05/2012 9:57 AM   

## 2012-08-05 NOTE — Progress Notes (Signed)
Psychoeducational Group Note  Date:  08/05/2012 Time:  1100  Group Topic/Focus:  Recovery Goals:   The focus of this group is to identify appropriate goals for recovery and establish a plan to achieve them.  Participation Level:  Active  Participation Quality:  Appropriate and Attentive  Affect:  Depressed  Cognitive:  Appropriate  Insight:  Engaged  Engagement in Group:  Engaged  Additional Comments:  Staff explained to the patient that the purpose of this group is to educate them on recovery, and on setting mid-range to long-term personal goals for their recovery process.  Staff also explained that the group will also address topics including what recovery is, who goes through recovery, the first steps toward recovery, and setting realistic goals for recovery. Patient was asked to identify two areas of their life in which they would like to make a change toward recovery, and set a specific measurable goal to address each change identified. Patient was provided with a homework assignment regarding how this goal impacts their personal recovery. Staff concluded the group by encouraging the patient to take a proactive approach to accomplishing their recovery goals.     Ardelle Park O 08/05/2012, 3:05 PM

## 2012-08-05 NOTE — Progress Notes (Signed)
[facility] MD Progress Note  08/05/2012 1:22 PM Dana Mcintosh  MRN:  [id number] Subjective:  Has a headache this morning, unilateral, throbbing, under her eye. Her daughter suffers from migraines. She did not sleep too well last night. Did better with the Trazodone, would rather go back on it. She is committed to her recovery and to go to [facility]. If D/C before her admission date to [facility] plans to stay with her sister Diagnosis:  Alcohol, cocaine, cannabis abuse, Major Depression recurrent  ADL's:  Intact  Sleep: Poor  Appetite:  Fair  Suicidal Ideation:  Plan:  Denies Intent:  Denies Means:  Denies Homicidal Ideation:  Plan:  Denies Intent:  Denies Means:  Denies AEB (as evidenced by):  Psychiatric Specialty Exam: Review of Systems  Constitutional: Negative.   Eyes: Negative.   Respiratory: Negative.   Cardiovascular: Negative.   Gastrointestinal: Negative.   Genitourinary: Negative.   Musculoskeletal: Negative.   Skin: Negative.   Neurological: Positive for headaches.  Endo/Heme/Allergies: Negative.   Psychiatric/Behavioral: Positive for depression. The patient has insomnia.     Blood pressure 95/69, pulse 84, temperature 98.1 F (36.7 C), temperature source Oral, resp. rate 16, height 5\' 6"  (1.676 m), weight 68.04 kg (150 lb), last menstrual period 07/27/1997.Body mass index is 24.21 kg/(m^2).  General Appearance: Disheveled  Eye Solicitor::  Fair  Speech:  Clear and Coherent  Volume:  Normal  Mood:  Depressed and worried, headache  Affect:  pain  Thought Process:  Coherent and Goal Directed  Orientation:  Full (Time, Place, and Person)  Thought Content:  worries, concerns, wanting to do well and abstain  Suicidal Thoughts:  No  Homicidal Thoughts:  No  Memory:  Immediate;   Fair Recent;   Fair Remote;   Fair  Judgement:  Fair  Insight:  Present  Psychomotor Activity:  Decreased  Concentration:  Fair  Recall:  Fair  Akathisia:  No  Handed:  Right  AIMS (if  indicated):     Assets:  Desire for Improvement  Sleep:  Number of Hours: 4    Current Medications: Current Facility-Administered Medications  Medication Dose Route Frequency Provider Last Rate Last Dose  . acetaminophen (TYLENOL) tablet 650 mg  650 mg Oral Q6H PRN Rachael Fee, MD      . alum & mag hydroxide-simeth (MAALOX/MYLANTA) 200-200-20 MG/5ML suspension 30 mL  30 mL Oral Q4H PRN Rachael Fee, MD      . chlordiazePOXIDE (LIBRIUM) capsule 25 mg  25 mg Oral TID PRN Nanine Means, NP   25 mg at 08/03/12 2123  . FLUoxetine (PROZAC) capsule 10 mg  10 mg Oral Daily Himabindu Ravi, MD   10 mg at 08/05/12 0815  . gabapentin (NEURONTIN) capsule 300 mg  300 mg Oral TID Rachael Fee, MD   300 mg at 08/05/12 1144  . gabapentin (NEURONTIN) capsule 300 mg  300 mg Oral QHS Kerry Hough, PA   300 mg at 08/05/12 0010  . hydrOXYzine (ATARAX/VISTARIL) tablet 25 mg  25 mg Oral Q6H PRN Jorje Guild, PA-C   25 mg at 08/04/12 2141  . ibuprofen (ADVIL,MOTRIN) tablet 400 mg  400 mg Oral Q4H PRN Rachael Fee, MD   400 mg at 08/02/12 1810  . [COMPLETED] magnesium citrate solution 1 Bottle  1 Bottle Oral Once Jorje Guild, PA-C   1 Bottle at 08/04/12 1600  . magnesium hydroxide (MILK OF MAGNESIA) suspension 30 mL  30 mL Oral Daily PRN Rachael Fee, MD  30 mL at 07/30/12 1308  . mirtazapine (REMERON) tablet 15 mg  15 mg Oral QHS Jorje Guild, PA-C   15 mg at 08/04/12 2141  . nicotine (NICODERM CQ - dosed in mg/24 hours) patch 21 mg  21 mg Transdermal Q0600 Rachael Fee, MD   21 mg at 08/05/12 1610  . pantoprazole (PROTONIX) EC tablet 40 mg  40 mg Oral Daily Rachael Fee, MD   40 mg at 08/05/12 0815  . potassium chloride SA (K-DUR,KLOR-CON) CR tablet 20 mEq  20 mEq Oral Daily Sanjuana Kava, NP   20 mEq at 08/05/12 0815  . triamterene-hydrochlorothiazide (MAXZIDE-25) 37.5-25 MG per tablet 1 each  1 each Oral Daily Rachael Fee, MD   1 each at 08/05/12 0815    Lab Results: No results found for this or any previous  visit (from the past 48 hour(s)).  Physical Findings: AIMS: Facial and Oral Movements Muscles of Facial Expression: None, normal Lips and Perioral Area: None, normal Jaw: None, normal Tongue: None, normal,Extremity Movements Upper (arms, wrists, hands, fingers): None, normal Lower (legs, knees, ankles, toes): None, normal, Trunk Movements Neck, shoulders, hips: None, normal, Overall Severity Severity of abnormal movements (highest score from questions above): None, normal Incapacitation due to abnormal movements: None, normal Patient's awareness of abnormal movements (rate only patient's report): No Awareness, Dental Status Current problems with teeth and/or dentures?: No Does patient usually wear dentures?: No  CIWA:  CIWA-Ar Total: 4  COWS:  COWS Total Score: 0   Treatment Plan Summary: Daily contact with patient to assess and evaluate symptoms and progress in treatment Medication management  Plan: Supportive approach/coping skills/relapse prevention/resume Trazodone HS PRN sleep  Medical Decision Making Problem Points:  Established problem, stable/improving (1), Review of last therapy session (1) and Review of psycho-social stressors (1) Data Points:  Review of medication regiment & side effects (2)  I certify that inpatient services furnished can reasonably be expected to improve the patient's condition.   Elizabet Schweppe A 08/05/2012, 1:22 PM

## 2012-08-05 NOTE — Progress Notes (Signed)
Pt reports she had a headache this morning which kept her in bed most of the day, but she has tried to be up this evening.  She denies withdrawal symptoms at this time.  She denies SI/HI/AV.  She says she will go to long term treatment at [facility] and has an appt on 12/16.  She says if she cannot go straight from here, she will stay with her sister until her appt.  Pt makes her needs known to staff.  Pt voices no needs/complaints at this time other than wanting to know when she will probably be discharged.  Encouraged pt to discuss discharge with MD or case manager.  Pt voices understanding.  Safety maintained with q15 minute checks.

## 2012-08-06 MED ORDER — GABAPENTIN 300 MG PO CAPS: 300.0000 mg | ORAL_CAPSULE | Freq: Every day | ORAL | Status: DC

## 2012-08-06 MED ORDER — GABAPENTIN 300 MG PO CAPS: 300.0000 mg | ORAL_CAPSULE | Freq: Three times a day (TID) | ORAL | Status: DC

## 2012-08-06 MED ORDER — GABAPENTIN 600 MG PO TABS
300.0000 mg | ORAL_TABLET | Freq: Four times a day (QID) | ORAL | Status: DC
  Filled 2012-08-06: qty 28

## 2012-08-06 MED ORDER — PANTOPRAZOLE SODIUM 40 MG PO TBEC: 40.0000 mg | DELAYED_RELEASE_TABLET | Freq: Every day | ORAL | Status: DC

## 2012-08-06 MED ORDER — GABAPENTIN 600 MG PO TABS
300.0000 mg | ORAL_TABLET | Freq: Three times a day (TID) | ORAL | Status: DC
  Filled 2012-08-06: qty 35

## 2012-08-06 MED ORDER — HYDROXYZINE HCL 25 MG PO TABS: 25.0000 mg | ORAL_TABLET | Freq: Four times a day (QID) | ORAL | Status: DC | PRN

## 2012-08-06 MED ORDER — TRAZODONE HCL 50 MG PO TABS: 50.0000 mg | ORAL_TABLET | Freq: Every evening | ORAL | Status: DC | PRN

## 2012-08-06 MED ORDER — FLUOXETINE HCL 10 MG PO CAPS: 10.0000 mg | ORAL_CAPSULE | Freq: Every day | ORAL | Status: DC

## 2012-08-06 MED ORDER — POTASSIUM CHLORIDE CRYS ER 20 MEQ PO TBCR: 20.0000 meq | EXTENDED_RELEASE_TABLET | Freq: Every day | ORAL | Status: DC

## 2012-08-06 MED ORDER — TRIAMTERENE-HCTZ 37.5-25 MG PO TABS: 1.0000 | ORAL_TABLET | Freq: Every day | ORAL | Status: DC

## 2012-08-06 NOTE — Discharge Summary (Signed)
Physician Discharge Summary Note  Patient:  Dana Mcintosh is an 61 y.o., female MRN:  [id number] DOB:  1951/02/10 Patient phone:  [phone] (home)  Patient address:   [address] [place] [address],   Date of Admission:  07/29/2012 Date of Discharge: 08/06/2012  Reason for Admission: Depression and alcohol/cocaine abuse  Discharge Diagnoses: Principal Problem:  *Cannabis abuse Active Problems:  Depressive disorder  Alcohol abuse with intoxication  Cocaine abuse  Review of Systems  Constitutional: Negative.   Eyes: Negative.   Respiratory: Negative.   Cardiovascular: Negative.   Gastrointestinal: Negative.   Genitourinary: Negative.   Musculoskeletal: Negative.   Skin: Negative.   Neurological: Negative.   Endo/Heme/Allergies: Negative.   Psychiatric/Behavioral: Positive for depression and substance abuse.   Axis Diagnosis:   AXIS I:  Alcohol Abuse, Depressive Disorder NOS, Substance Abuse and Substance Induced Mood Disorder AXIS II:  Deferred AXIS III:   Past Medical History  Diagnosis Date  . Hypertension   . ETOH abuse   . Substance abuse   . Mental disorder   . Depression    AXIS IV:  economic problems, housing problems, other psychosocial or environmental problems, problems related to social environment and problems with primary support group AXIS V:  61-70 mild symptoms  Level of Care:  OP  Hospital Course:  61 year old African-American female, admitted to [facility] from the [facility] ED with complaints of suicide attempt by overdosing on alcohol and crack. At the time, I was stressed and depressed because of life situations. She is divorced due to drug problems she developed during her marriage, ended up costing her marriage because she could not stop.  She was living with her daughter but gets thrown out frequently and is now homeless.  She was going to [facility] and was taking some medications she could not remember but stopped because  she could not afford them. She had been sober for four months prior to recently restarting. Patient was placed on trazodone for sleep, Prozac for depression, protonix for GERD, gabapentin 300 mg TID for anxiety and cocaine/alcohol withdrawal, potassium supplement, blood pressure medication (maxzide 37.5/25 mg).  Individual and group therapy, AA group, and work with coping skills.  Patient will continue her rehab at [facility].  Consults:  None  Significant Diagnostic Studies:  labs: Completed and reviewed, stable  Discharge Vitals:   Blood pressure 84/58, pulse 82, temperature 96.8 F (36 C), temperature source Oral, resp. rate 16, height 5\' 6"  (1.676 m), weight 68.04 kg (150 lb), last menstrual period 07/27/1997. Body mass index is 24.21 kg/(m^2). Lab Results:   No results found for this or any previous visit (from the past 72 hour(s)).  Physical Findings: AIMS: Facial and Oral Movements Muscles of Facial Expression: None, normal Lips and Perioral Area: None, normal Jaw: None, normal Tongue: None, normal,Extremity Movements Upper (arms, wrists, hands, fingers): None, normal Lower (legs, knees, ankles, toes): None, normal, Trunk Movements Neck, shoulders, hips: None, normal, Overall Severity Severity of abnormal movements (highest score from questions above): None, normal Incapacitation due to abnormal movements: None, normal Patient's awareness of abnormal movements (rate only patient's report): No Awareness, Dental Status Current problems with teeth and/or dentures?: No Does patient usually wear dentures?: No  CIWA:  CIWA-Ar Total: 4  COWS:  COWS Total Score: 0   Psychiatric Specialty Exam: See Psychiatric Specialty Exam and Suicide Risk Assessment completed by Attending Physician prior to discharge.  Discharge destination:  Home  Is patient on multiple antipsychotic  therapies at discharge:  No   Has Patient had three or more failed trials of antipsychotic monotherapy by history:   No Recommended Plan for Multiple Antipsychotic Therapies: N/A  Discharge Orders    Future Orders Please Complete By Expires   Diet - low sodium heart healthy      Activity as tolerated - No restrictions          Medication List     As of 08/06/2012  1:28 PM    TAKE these medications      Indication    FLUoxetine 10 MG capsule   Commonly known as: PROZAC   Take 1 capsule (10 mg total) by mouth daily.    Indication: Depression      gabapentin 300 MG capsule   Commonly known as: NEURONTIN   Take 1 capsule (300 mg total) by mouth 3 (three) times daily.    Indication: Agitation, Alcohol Withdrawal Syndrome, Cocaine Dependence      gabapentin 300 MG capsule   Commonly known as: NEURONTIN   Take 1 capsule (300 mg total) by mouth at bedtime.    Indication: Agitation, Alcohol Withdrawal Syndrome, Cocaine Dependence, Trouble Sleeping      hydrOXYzine 25 MG tablet   Commonly known as: ATARAX/VISTARIL   Take 1 tablet (25 mg total) by mouth every 6 (six) hours as needed for anxiety (sinus congestion).       ibuprofen 200 MG tablet   Commonly known as: ADVIL,MOTRIN   Take 400 mg by mouth daily as needed. For pain.       pantoprazole 40 MG tablet   Commonly known as: PROTONIX   Take 1 tablet (40 mg total) by mouth daily.    Indication: Gastroesophageal Reflux Disease      potassium chloride SA 20 MEQ tablet   Commonly known as: K-DUR,KLOR-CON   Take 1 tablet (20 mEq total) by mouth daily.    Indication: Low Amount of Potassium in the Blood      traZODone 50 MG tablet   Commonly known as: DESYREL   Take 1 tablet (50 mg total) by mouth at bedtime as needed for sleep.       triamterene-hydrochlorothiazide 37.5-25 MG per tablet   Commonly known as: MAXZIDE-25   Take 1 each (1 tablet total) by mouth daily.    Indication: High Blood Pressure           Follow-up Information    Follow up with [facility]. On 08/11/2012. (Arrive at 8:00 am promptly!!)    Contact  information:   [address] [place], [place] [address] [phone]         Follow-up recommendations:  Activity as tolerated, low-sodium heart healthy diet  Comments:  Patient will go home with her sister until she gets a bed at [facility] to continue her rehab.  Rx and 14 day supply of medications given to her with instructions upon discharge.  Total Discharge Time:  Greater than 30 minutes  Signed: Nanine Means, PMH-NP 08/06/2012, 1:28 PM

## 2012-08-06 NOTE — Tx Team (Addendum)
Interdisciplinary Treatment Plan Update (Adult)  Date:  08/06/2012  Time Reviewed:  9:34 AM   Progress in Treatment: Attending groups: Yes Participating in groups:  Yes Taking medication as prescribed: Yes Tolerating medication:  Yes Family/Significant othe contact made:  Yes Patient understands diagnosis:  Yes Discussing patient identified problems/goals with staff:  Yes Medical problems stabilized or resolved:  Yes Denies suicidal/homicidal ideation: Yes Issues/concerns per patient self-inventory:  None identified Other: N/A  New problem(s) identified: None Identified  Reason for Continuation of Hospitalization: Stable to d/c  Interventions implemented related to continuation of hospitalization: Stable to d/c  Additional comments: N/A  Estimated length of stay: D/C today  Discharge Plan: Pt will follow up with [facility] on 12/16 for further treatment.    New goal(s): N/A  Review of initial/current patient goals per problem list:    1.  Goal(s): Eliminate SI  Met:  Yes  Target date: by discharge  As evidenced by: Pt denies SI.    Attendees: Patient:   08/06/2012 9:34 AM   Family:     Physician:  Geoffery Lyons, MD 08/06/2012 9:34 AM   Nursing: Berneice Heinrich, RN 08/06/2012 9:34 AM   Clinical Social Worker:  Reyes Ivan, LCSWA 08/06/2012 9:34 AM   Other: Olivia Mackie, Psyc intern 08/06/2012 9:34 AM   Other:  Roswell Miners, RN 08/06/2012 9:41 AM   Other:  Nanine Means, NP 08/06/2012 9:41 AM   Other:     Other:      Scribe for Treatment Team:   Reyes Ivan 08/06/2012 9:34 AM

## 2012-08-06 NOTE — Progress Notes (Signed)
Patient ID: Dana Mcintosh, female   DOB: [birth year], 61 y.o.   MRN: [id number]  She has been discharged and her son picked her up then she was going to her sisters till Monday.  She voiced under of discharge instructions and of follow up plan. She denies thoughts of SI and all belonging taken home with her. She was very thankful that she had came here and wast looking forward to her future.

## 2012-08-06 NOTE — Clinical Social Work Note (Signed)
[facility] LCSW Group Therapy  08/06/2012  1:15 PM   Type of Therapy:  Group Therapy  Participation Level:  Did Not Attend group on emotion regulation  Yania Bogie Horton, LCSWA 08/06/2012 2:36 PM   

## 2012-08-06 NOTE — [facility] Suicide Risk Assessment (Signed)
Suicide Risk Assessment  Discharge Assessment     Demographic Factors:  Living alone and Unemployed  Mental Status Per Nursing Assessment::   On Admission:     Current Mental Status by Physician: In full contact with reality. There are no suicidal ideas, plans or intent. Her mood is euthymic, her affect is appropriate. She is going to stay with her sister while waiting to get into [facility] on the 16. She is committed and motivated to pursue her recovery.    Loss Factors: Decline in physical health  Historical Factors: NA  Risk Reduction Factors:   Sense of responsibility to family  Continued Clinical Symptoms:  Depression:   Comorbid alcohol abuse/dependence  Cognitive Features That Contribute To Risk: None evidenced   Suicide Risk:  Minimal: No identifiable suicidal ideation.  Patients presenting with no risk factors but with morbid ruminations; may be classified as minimal risk based on the severity of the depressive symptoms  Discharge Diagnoses:   AXIS I:  Major Depression recurrent, Alcohol, cocaine, cannabis abuse AXIS II:  Deferred AXIS III:   Past Medical History  Diagnosis Date  . Hypertension   . ETOH abuse   . Substance abuse   . Mental disorder   . Depression    AXIS IV:  economic problems, housing problems, occupational problems and problems with primary support group AXIS V:  61-70 mild symptoms  Plan Of Care/Follow-up recommendations:  Activity:  As tolerated Diet:  Regular [facility] on December 16  Is patient on multiple antipsychotic therapies at discharge:  No   Has Patient had three or more failed trials of antipsychotic monotherapy by history:  No  Recommended Plan for Multiple Antipsychotic Therapies: N/A   Bryor Rami A 08/06/2012, 12:27 PM

## 2012-08-06 NOTE — Clinical Social Work Note (Signed)
[facility] LCSW Aftercare Discharge Planning Group Note  08/06/2012 8:45 AM  Participation Quality:  Did Not Attend  Reyes Ivan, LCSWA 08/06/2012 9:42 AM

## 2012-08-06 NOTE — Progress Notes (Signed)
[facility] Adult Case Management Discharge Plan :  Will you be returning to the same living situation after discharge: Yes,  returning to stay with sister until treatment At discharge, do you have transportation home?:Yes,  access to transportation' Do you have the ability to pay for your medications:Yes,  access to meds  Release of information consent forms completed and in the chart;  Patient's signature needed at discharge.  Patient to Follow up at: Follow-up Information    Follow up with [facility]. On 08/11/2012. (Arrive at 8:00 am promptly!!)    Contact information:   [address] [place], [place] [address] [phone]         Patient denies SI/HI:   Yes,  denies SI/HI    Safety Planning and Suicide Prevention discussed:  Yes,  discussed with pt  Pt reports feeling less depressed and anxious and feels ready to d/c today.  No recommendations from CSW.  No further needs voiced by pt.  Pt stable to discharge.    Carmina Miller 08/06/2012, 9:36 AM

## 2012-08-08 NOTE — Progress Notes (Signed)
Patient Discharge Instructions:  After Visit Summary (AVS):   Faxed to:  08/08/12 Psychiatric Admission Assessment Note:   Faxed to:  08/08/12 Suicide Risk Assessment - Discharge Assessment:   Faxed to:  08/08/12 Faxed/Sent to the Next Level Care provider:  08/08/12 Faxed to [facility] @ [phone]  Jerelene Redden, 08/08/2012, 3:30 PM

## 2012-08-12 NOTE — Discharge Summary (Signed)
Agree with assessment and plan Keara Pagliarulo A. July Linam, M.D. 

## 2012-08-12 NOTE — Progress Notes (Signed)
Agree with assessment and plan Piper Albro A. Keirah Konitzer, M.D. 

## 2012-10-11 ENCOUNTER — Emergency Department (HOSPITAL_COMMUNITY)
Admission: EM | Admit: 2012-10-11 | Discharge: 2012-10-11 | Disposition: A | Discharge status: Home / Self Care | Payer: Self-pay | Attending: Emergency Medicine | Admitting: Emergency Medicine

## 2012-10-11 ENCOUNTER — Encounter (HOSPITAL_COMMUNITY): Payer: Self-pay | Admitting: Cardiology

## 2012-10-11 DIAGNOSIS — N390 Urinary tract infection, site not specified: Secondary | ICD-10-CM | POA: Insufficient documentation

## 2012-10-11 DIAGNOSIS — Z8659 Personal history of other mental and behavioral disorders: Secondary | ICD-10-CM | POA: Insufficient documentation

## 2012-10-11 DIAGNOSIS — Z9889 Other specified postprocedural states: Secondary | ICD-10-CM | POA: Insufficient documentation

## 2012-10-11 DIAGNOSIS — F3289 Other specified depressive episodes: Secondary | ICD-10-CM | POA: Insufficient documentation

## 2012-10-11 DIAGNOSIS — Z79899 Other long term (current) drug therapy: Secondary | ICD-10-CM | POA: Insufficient documentation

## 2012-10-11 DIAGNOSIS — J029 Acute pharyngitis, unspecified: Secondary | ICD-10-CM | POA: Insufficient documentation

## 2012-10-11 DIAGNOSIS — F1010 Alcohol abuse, uncomplicated: Secondary | ICD-10-CM | POA: Insufficient documentation

## 2012-10-11 DIAGNOSIS — R51 Headache: Secondary | ICD-10-CM | POA: Insufficient documentation

## 2012-10-11 DIAGNOSIS — N72 Inflammatory disease of cervix uteri: Secondary | ICD-10-CM | POA: Insufficient documentation

## 2012-10-11 DIAGNOSIS — F1410 Cocaine abuse, uncomplicated: Secondary | ICD-10-CM | POA: Insufficient documentation

## 2012-10-11 DIAGNOSIS — N949 Unspecified condition associated with female genital organs and menstrual cycle: Secondary | ICD-10-CM | POA: Insufficient documentation

## 2012-10-11 DIAGNOSIS — F17200 Nicotine dependence, unspecified, uncomplicated: Secondary | ICD-10-CM | POA: Insufficient documentation

## 2012-10-11 DIAGNOSIS — F329 Major depressive disorder, single episode, unspecified: Secondary | ICD-10-CM | POA: Insufficient documentation

## 2012-10-11 DIAGNOSIS — Z202 Contact with and (suspected) exposure to infections with a predominantly sexual mode of transmission: Secondary | ICD-10-CM | POA: Insufficient documentation

## 2012-10-11 DIAGNOSIS — Z87891 Personal history of nicotine dependence: Secondary | ICD-10-CM | POA: Insufficient documentation

## 2012-10-11 DIAGNOSIS — R3915 Urgency of urination: Secondary | ICD-10-CM | POA: Insufficient documentation

## 2012-10-11 DIAGNOSIS — F1210 Cannabis abuse, uncomplicated: Secondary | ICD-10-CM | POA: Insufficient documentation

## 2012-10-11 DIAGNOSIS — R109 Unspecified abdominal pain: Secondary | ICD-10-CM | POA: Insufficient documentation

## 2012-10-11 DIAGNOSIS — I10 Essential (primary) hypertension: Secondary | ICD-10-CM | POA: Insufficient documentation

## 2012-10-11 LAB — URINALYSIS, ROUTINE W REFLEX MICROSCOPIC
Bilirubin Urine: NEGATIVE
Ketones, ur: NEGATIVE mg/dL
Nitrite: POSITIVE — AB
Urobilinogen, UA: >8 mg/dL — ABNORMAL HIGH (ref 0.0–1.0)
pH: 6.5 (ref 5.0–8.0)

## 2012-10-11 LAB — WET PREP, GENITAL: Yeast Wet Prep HPF POC: NONE SEEN

## 2012-10-11 LAB — URINE MICROSCOPIC-ADD ON

## 2012-10-11 MED ORDER — LIDOCAINE HCL (PF) 1 % IJ SOLN
INTRAMUSCULAR | Status: AC
  Administered 2012-10-11: 5 mL
  Filled 2012-10-11: qty 5

## 2012-10-11 MED ORDER — AZITHROMYCIN 250 MG PO TABS
1000.0000 mg | ORAL_TABLET | Freq: Once | ORAL | Status: AC
  Administered 2012-10-11: 1000 mg via ORAL
  Filled 2012-10-11: qty 4

## 2012-10-11 MED ORDER — ACETAMINOPHEN 325 MG PO TABS
650.0000 mg | ORAL_TABLET | Freq: Once | ORAL | Status: AC
  Administered 2012-10-11: 650 mg via ORAL
  Filled 2012-10-11: qty 2

## 2012-10-11 MED ORDER — CEFTRIAXONE SODIUM 250 MG IJ SOLR
250.0000 mg | Freq: Once | INTRAMUSCULAR | Status: AC
  Administered 2012-10-11: 250 mg via INTRAMUSCULAR
  Filled 2012-10-11: qty 250

## 2012-10-11 MED ORDER — TRIAMTERENE-HCTZ 37.5-25 MG PO TABS: 1.0000 | ORAL_TABLET | Freq: Every day | ORAL | Status: DC

## 2012-10-11 MED ORDER — POTASSIUM CHLORIDE ER 10 MEQ PO TBCR: 20.0000 meq | EXTENDED_RELEASE_TABLET | ORAL | Status: DC
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
## 2012-10-14 LAB — URINE CULTURE: Colony Count: >=100000

## 2012-10-15 ENCOUNTER — Telehealth (HOSPITAL_COMMUNITY): Payer: Self-pay | Admitting: Emergency Medicine

## 2012-10-15 NOTE — ED Notes (Signed)
+   urine Chart sent to EDP office for review. 

## 2012-10-15 NOTE — ED Provider Notes (Signed)
Medical screening examination/treatment/procedure(s) were performed by non-physician practitioner and as supervising physician I was immediately available for consultation/collaboration.   Gavin Pound. Shevawn Langenberg, MD 10/15/12 1049

## 2012-10-15 NOTE — ED Notes (Signed)
Pt called for lab results.  ID verified x 2.  Pt informed STD results (-) but URNC (+) -> >/= 100,000 colonies, E Coli.  Pt not given Rx for abx while in ED.  Informed pt chart to go to MD for review.  Will call her when chart back from MD.

## 2012-10-17 NOTE — ED Notes (Signed)
Chart returned from EDP office with order written by Fayrene Helper for Cipro 500 mg Po BID x 7 days.Patient wants rx called to [facility] [place].

## 2012-10-17 NOTE — ED Notes (Signed)
rx called by Hazle Coca.

## 2014-01-28 ENCOUNTER — Ambulatory Visit (HOSPITAL_COMMUNITY)
Admission: RE | Admit: 2014-01-28 | Discharge: 2014-01-28 | Disposition: A | Discharge status: Home / Self Care | Payer: Medicaid Other | Source: Ambulatory Visit | Attending: Nurse Practitioner | Admitting: Nurse Practitioner

## 2014-01-28 ENCOUNTER — Other Ambulatory Visit (HOSPITAL_COMMUNITY): Payer: Self-pay | Admitting: Nurse Practitioner

## 2014-01-28 DIAGNOSIS — M79609 Pain in unspecified limb: Secondary | ICD-10-CM

## 2014-02-14 ENCOUNTER — Encounter (HOSPITAL_COMMUNITY): Payer: Self-pay | Admitting: Emergency Medicine

## 2014-02-14 ENCOUNTER — Emergency Department (HOSPITAL_COMMUNITY)
Admission: EM | Admit: 2014-02-14 | Discharge: 2014-02-14 | Disposition: A | Discharge status: Home / Self Care | Payer: Medicaid Other | Attending: Emergency Medicine | Admitting: Emergency Medicine

## 2014-02-14 ENCOUNTER — Emergency Department (HOSPITAL_COMMUNITY): Payer: Medicaid Other

## 2014-02-14 DIAGNOSIS — S8253XA Displaced fracture of medial malleolus of unspecified tibia, initial encounter for closed fracture: Secondary | ICD-10-CM | POA: Insufficient documentation

## 2014-02-14 DIAGNOSIS — F3289 Other specified depressive episodes: Secondary | ICD-10-CM | POA: Insufficient documentation

## 2014-02-14 DIAGNOSIS — S92301A Fracture of unspecified metatarsal bone(s), right foot, initial encounter for closed fracture: Secondary | ICD-10-CM

## 2014-02-14 DIAGNOSIS — Z880 Allergy status to penicillin: Secondary | ICD-10-CM | POA: Insufficient documentation

## 2014-02-14 DIAGNOSIS — Y9389 Activity, other specified: Secondary | ICD-10-CM | POA: Insufficient documentation

## 2014-02-14 DIAGNOSIS — S8251XA Displaced fracture of medial malleolus of right tibia, initial encounter for closed fracture: Secondary | ICD-10-CM

## 2014-02-14 DIAGNOSIS — S92309A Fracture of unspecified metatarsal bone(s), unspecified foot, initial encounter for closed fracture: Secondary | ICD-10-CM | POA: Insufficient documentation

## 2014-02-14 DIAGNOSIS — I10 Essential (primary) hypertension: Secondary | ICD-10-CM | POA: Insufficient documentation

## 2014-02-14 DIAGNOSIS — Z79899 Other long term (current) drug therapy: Secondary | ICD-10-CM | POA: Insufficient documentation

## 2014-02-14 DIAGNOSIS — F329 Major depressive disorder, single episode, unspecified: Secondary | ICD-10-CM | POA: Insufficient documentation

## 2014-02-14 DIAGNOSIS — Y9289 Other specified places as the place of occurrence of the external cause: Secondary | ICD-10-CM | POA: Insufficient documentation

## 2014-02-14 DIAGNOSIS — F489 Nonpsychotic mental disorder, unspecified: Secondary | ICD-10-CM | POA: Insufficient documentation

## 2014-02-14 DIAGNOSIS — F17200 Nicotine dependence, unspecified, uncomplicated: Secondary | ICD-10-CM | POA: Insufficient documentation

## 2014-02-14 MED ORDER — OXYCODONE-ACETAMINOPHEN 5-325 MG PO TABS: 1.0000 | ORAL_TABLET | ORAL | Status: DC | PRN

## 2014-02-14 MED ORDER — HYDROCODONE-ACETAMINOPHEN 5-325 MG PO TABS
1.0000 | ORAL_TABLET | Freq: Once | ORAL | Status: AC
  Administered 2014-02-14: 1 via ORAL
  Filled 2014-02-14: qty 1

## 2014-02-14 NOTE — Discharge Instructions (Signed)
Read the information below.  Use the prescribed medication as directed.  Please discuss all new medications with your pharmacist.  Do not take additional tylenol while taking the prescribed pain medication to avoid overdose.  You may return to the Emergency Department at any time for worsening condition or any new symptoms that concern you.  If you develop uncontrolled pain, weakness or numbness of the extremity, severe discoloration of the skin, or you are unable to move your toes, return to the ER for a recheck.      Ankle Fracture A fracture is a break in a bone. The ankle joint is made up of three bones. These include the lower (distal)sections of your lower leg bones, called the tibia and fibula, along with a bone in your foot, called the talus. Depending on how bad the break is and if more than one ankle joint bone is broken, a cast or splint is used to protect and keep your injured bone from moving while it heals. Sometimes, surgery is required to help the fracture heal properly.  There are two general types of fractures:  Stable fracture. This includes a single fracture line through one bone, with no injury to ankle ligaments. A fracture of the talus that does not have any displacement (movement of the bone on either side of the fracture line) is also stable.  Unstable fracture. This includes more than one fracture line through one or more bones in the ankle joint. It also includes fractures that have displacement of the bone on either side of the fracture line. CAUSES  A direct blow to the ankle.   Quickly and severely twisting your ankle.  Trauma, such as a car accident or falling from a significant height. RISK FACTORS You may be at a higher risk of ankle fracture if:  You have certain medical conditions.  You are involved in high-impact sports.  You are involved in a high-impact car accident. SIGNS AND SYMPTOMS   Tender and swollen ankle.  Bruising around the injured  ankle.  Pain on movement of the ankle.  Difficulty walking or putting weight on the ankle.  A cold foot below the site of the ankle injury. This can occur if the blood vessels passing through your injured ankle were also damaged.  Numbness in the foot below the site of the ankle injury. DIAGNOSIS  An ankle fracture is usually diagnosed with a physical exam and X-rays. A CT scan may also be required for complex fractures. TREATMENT  Stable fractures are treated with a cast or splint and using crutches to avoid putting weight on your injured ankle. This is followed by an ankle strengthening program. Some patients require a special type of cast, depending on other medical problems they may have. Unstable fractures require surgery to ensure the bones heal properly. Your health care provider will tell you what type of fracture you have and the best treatment for your condition. HOME CARE INSTRUCTIONS   Review correct crutch use with your health care provider and use your crutches as directed. Safe use of crutches is extremely important. Misuse of crutches can cause you to fall or cause injury to nerves in your hands or armpits.  Do not put weight or pressure on the injured ankle until directed by your health care provider.  To lessen the swelling, keep the injured leg elevated while sitting or lying down.  Apply ice to the injured area:  Put ice in a plastic bag.  Place a towel between  your cast and the bag.  Leave the ice on for 20 minutes, 2-3 times a day.  If you have a plaster or fiberglass cast:  Do not try to scratch the skin under the cast with any objects. This can increase your risk of skin infection.  Check the skin around the cast every day. You may put lotion on any red or sore areas.  Keep your cast dry and clean.  If you have a plaster splint:  Wear the splint as directed.  You may loosen the elastic around the splint if your toes become numb, tingle, or turn cold or  blue.  Do not put pressure on any part of your cast or splint; it may break. Rest your cast only on a pillow the first 24 hours until it is fully hardened.  Your cast or splint can be protected during bathing with a plastic bag sealed to your skin with medical tape. Do not lower the cast or splint into water.  Take medicines as directed by your health care provider. Only take over-the-counter or prescription medicines for pain, discomfort, or fever as directed by your health care provider.  Do not drive a vehicle until your health care provider specifically tells you it is safe to do so.  If your health care provider has given you a follow-up appointment, it is very important to keep that appointment. Not keeping the appointment could result in a chronic or permanent injury, pain, and disability. If you have any problem keeping the appointment, call the facility for assistance. SEEK MEDICAL CARE IF: You develop increased swelling or discomfort. SEEK IMMEDIATE MEDICAL CARE IF:   Your cast gets damaged or breaks.  You have continued severe pain.  You develop new pain or swelling after the cast was put on.  Your skin or toenails below the injury turn blue or gray.  Your skin or toenails below the injury feel cold, numb, or have loss of sensitivity to touch.  There is a bad smell or pus draining from under the cast. MAKE SURE YOU:   Understand these instructions.  Will watch your condition.  Will get help right away if you are not doing well or get worse. Document Released: 08/10/2000 Document Revised: 08/18/2013 Document Reviewed: 03/12/2013 [facility] Patient Information 2015 [place]. This information is not intended to replace advice given to you by your health care provider. Make sure you discuss any questions you have with your health care provider.  Metatarsal Fracture, Undisplaced A metatarsal fracture is a break in the bone(s) of the foot. These are the bones of the  foot that connect your toes to the bones of the ankle. DIAGNOSIS  The diagnoses of these fractures are usually made with X-rays. If there are problems in the forefoot and x-rays are normal a later bone scan will usually make the diagnosis.  TREATMENT AND HOME CARE INSTRUCTIONS  Treatment may or may not include a cast or walking shoe. When casts are needed the use is usually for short periods of time so as not to slow down healing with muscle wasting (atrophy).  Activities should be stopped until further advised by your caregiver.  Wear shoes with adequate shock absorbing capabilities and stiff soles.  Alternative exercise may be undertaken while waiting for healing. These may include bicycling and swimming, or as your caregiver suggests.  It is important to keep all follow-up visits or specialty referrals. The failure to keep these appointments could result in improper bone healing and chronic  pain or disability.  Warning: Do not drive a car or operate a motor vehicle until your caregiver specifically tells you it is safe to do so. IF YOU DO NOT HAVE A CAST OR SPLINT:  You may walk on your injured foot as tolerated or advised.  Do not put any weight on your injured foot for as long as directed by your caregiver. Slowly increase the amount of time you walk on the foot as the pain allows or as advised.  Use crutches until you can bear weight without pain. A gradual increase in weight bearing may help.  Apply ice to the injury for 15-20 minutes each hour while awake for the first 2 days. Put the ice in a plastic bag and place a towel between the bag of ice and your skin.  Only take over-the-counter or prescription medicines for pain, discomfort, or fever as directed by your caregiver. SEEK IMMEDIATE MEDICAL CARE IF:   Your cast gets damaged or breaks.  You have continued severe pain or more swelling than you did before the cast was put on, or the pain is not controlled with  medications.  Your skin or nails below the injury turn blue or grey, or feel cold or numb.  There is a bad smell, or new stains or pus-like (purulent) drainage coming from the cast. MAKE SURE YOU:   Understand these instructions.  Will watch your condition.  Will get help right away if you are not doing well or get worse. Document Released: 05/05/2002 Document Revised: 11/05/2011 Document Reviewed: 03/26/2008 [facility] Patient Information 2015 [place], [place]. This information is not intended to replace advice given to you by your health care provider. Make sure you discuss any questions you have with your health care provider.  Cast or Splint Care Casts and splints support injured limbs and keep bones from moving while they heal. It is important to care for your cast or splint at home.  HOME CARE INSTRUCTIONS  Keep the cast or splint uncovered during the drying period. It can take 24 to 48 hours to dry if it is made of plaster. A fiberglass cast will dry in less than 1 hour.  Do not rest the cast on anything harder than a pillow for the first 24 hours.  Do not put weight on your injured limb or apply pressure to the cast until your health care provider gives you permission.  Keep the cast or splint dry. Wet casts or splints can lose their shape and may not support the limb as well. A wet cast that has lost its shape can also create harmful pressure on your skin when it dries. Also, wet skin can become infected.  Cover the cast or splint with a plastic bag when bathing or when out in the rain or snow. If the cast is on the trunk of the body, take sponge baths until the cast is removed.  If your cast does become wet, dry it with a towel or a blow dryer on the cool setting only.  Keep your cast or splint clean. Soiled casts may be wiped with a moistened cloth.  Do not place any hard or soft foreign objects under your cast or splint, such as cotton, toilet paper, lotion, or  powder.  Do not try to scratch the skin under the cast with any object. The object could get stuck inside the cast. Also, scratching could lead to an infection. If itching is a problem, use a blow dryer on a  cool setting to relieve discomfort.  Do not trim or cut your cast or remove padding from inside of it.  Exercise all joints next to the injury that are not immobilized by the cast or splint. For example, if you have a long leg cast, exercise the hip joint and toes. If you have an arm cast or splint, exercise the shoulder, elbow, thumb, and fingers.  Elevate your injured arm or leg on 1 or 2 pillows for the first 1 to 3 days to decrease swelling and pain.It is best if you can comfortably elevate your cast so it is higher than your heart. SEEK MEDICAL CARE IF:   Your cast or splint cracks.  Your cast or splint is too tight or too loose.  You have unbearable itching inside the cast.  Your cast becomes wet or develops a soft spot or area.  You have a bad smell coming from inside your cast.  You get an object stuck under your cast.  Your skin around the cast becomes red or raw.  You have new pain or worsening pain after the cast has been applied. SEEK IMMEDIATE MEDICAL CARE IF:   You have fluid leaking through the cast.  You are unable to move your fingers or toes.  You have discolored (blue or white), cool, painful, or very swollen fingers or toes beyond the cast.  You have tingling or numbness around the injured area.  You have severe pain or pressure under the cast.  You have any difficulty with your breathing or have shortness of breath.  You have chest pain. Document Released: 08/10/2000 Document Revised: 06/03/2013 Document Reviewed: 02/19/2013 [facility] Patient Information 2015 [place], [place]. This information is not intended to replace advice given to you by your health care provider. Make sure you discuss any questions you have with your health care provider.

## 2014-02-14 NOTE — Progress Notes (Signed)
Orthopedic Tech Progress Note Patient Details:  Talbot GrumblingCarolyn R Delia 09/21/1950 [id number]  Ortho Devices Type of Ortho Device: Ace wrap;Crutches;Post (short leg) splint;Stirrup splint Ortho Device/Splint Location: rle Ortho Device/Splint Interventions: Application   Crawford, Rembert 02/14/2014, 7:03 PM

## 2014-02-14 NOTE — ED Notes (Addendum)
Pt is in a gown and on the monitor. 

## 2014-02-14 NOTE — ED Notes (Signed)
Pt presents to department for evaluation of R foot swelling and pain. Onset yesterday. States R foot and ankle feel warm and tight. Swelling and pain increased since onset. 9/10 pain at the time. Able to wiggle digits, pulses intact.

## 2014-02-14 NOTE — ED Notes (Signed)
otho tech to see pt.

## 2014-02-14 NOTE — ED Notes (Signed)
Pt refused to put on a gown and to be placed on the monitor. Pt is sitting up on the side of the bed at this time.

## 2014-02-14 NOTE — ED Provider Notes (Signed)
CSN: [id number]     Arrival date & time 02/14/14  1255 History   First MD Initiated Contact with Patient 02/14/14 1507     Chief Complaint  Patient presents with  . Edema     (Consider location/radiation/quality/duration/timing/severity/associated sxs/prior Treatment) HPI  Patient presents with right foot pain.  Patient reports she was getting out of the car yesterday and got her left foot caught, fell out onto her right foot, then fell again while trying to get up.  Reports the right ankle was slightly swollen and painful last night but it much worse today.  Pain is 9/10 with walking.  She has attempted to elevated it overnight without improvement.  No other treatment.  Denies fevers, chills, numbness, weakness, tingling.  No hx gout.  Does not feel sick.    Past Medical History  Diagnosis Date  . Hypertension   . ETOH abuse   . Substance abuse   . Mental disorder   . Depression    Past Surgical History  Procedure Laterality Date  . Cholecystectomy    . Tubal ligation     Family History  Problem Relation Age of Onset  . Diabetes Mother   . Hypertension Mother   . Cancer Father    History  Substance Use Topics  . Smoking status: Current Every Day Smoker -- 6.00 packs/day for 40 years    Types: Cigarettes  . Smokeless tobacco: Not on file  . Alcohol Use: 12.0 oz/week    20 Shots of liquor per week     Comment: social   OB History   Grav Para Term Preterm Abortions TAB SAB Ect Mult Living                 Review of Systems  All other systems reviewed and are negative.     Allergies  Penicillins  Home Medications   Prior to Admission medications   Medication Sig Start Date End Date Taking? Authorizing Provider  FLUoxetine (PROZAC) 10 MG capsule Take 1 capsule (10 mg total) by mouth daily. 08/06/12   Nanine MeansJamison Lord, NP  gabapentin (NEURONTIN) 300 MG capsule Take 1 capsule (300 mg total) by mouth 3 (three) times daily. 08/06/12   Nanine MeansJamison Lord, NP  hydrOXYzine  (ATARAX/VISTARIL) 25 MG tablet Take 1 tablet (25 mg total) by mouth every 6 (six) hours as needed for anxiety (sinus congestion). 08/06/12   Nanine MeansJamison Lord, NP  potassium chloride (K-DUR) 10 MEQ tablet Take 2 tablets (20 mEq total) by mouth 1 day or 1 dose. 10/11/12   Glade NurseBarbara Beck, PA-C  potassium chloride SA (K-DUR,KLOR-CON) 20 MEQ tablet Take 1 tablet (20 mEq total) by mouth daily. 08/06/12   Nanine MeansJamison Lord, NP  traZODone (DESYREL) 50 MG tablet Take 1 tablet (50 mg total) by mouth at bedtime as needed for sleep. 08/06/12   Nanine MeansJamison Lord, NP  triamterene-hydrochlorothiazide (MAXZIDE-25) 37.5-25 MG per tablet Take 1 each (1 tablet total) by mouth daily. 08/06/12 08/06/13  Nanine MeansJamison Lord, NP  triamterene-hydrochlorothiazide (MAXZIDE-25) 37.5-25 MG per tablet Take 1 each (1 tablet total) by mouth daily. 10/11/12   Glade NurseBarbara Beck, PA-C   BP 154/91  Pulse 63  Temp(Src) 98.5 F (36.9 C) (Oral)  Resp 16  Ht 5\' 6"  (1.676 m)  Wt 145 lb (65.772 kg)  BMI 23.41 kg/m2  SpO2 100%  LMP 07/27/1997 Physical Exam  Nursing note and vitals reviewed. Constitutional: She appears well-developed and well-nourished. No distress.  HENT:  Head: Normocephalic and atraumatic.  Neck: Neck supple.  Pulmonary/Chest: Effort normal.  Musculoskeletal:  Right ankle and foot with edema, erythema, diffuse tenderness, small areas ecchymosis.  Slightly warmth compared to left.  Calf is without edema, nontender.  Distal pulses intact.  Sensation intact.    Neurological: She is alert.  Skin: She is not diaphoretic.    ED Course  Procedures (including critical care time) Labs Review Labs Reviewed - No data to display  Imaging Review Dg Tibia/fibula Right  02/14/2014   CLINICAL DATA:  History of trauma from a fall complaining of pain in the right leg.  EXAM: RIGHT TIBIA AND FIBULA - 2 VIEW  COMPARISON:  No priors.  FINDINGS: AP and lateral views of the right tibia and fibula demonstrate a cortical irregularity and subtle disruption  of the trabeculae in the medial malleolus. Soft tissues are swollen both medial and lateral to the ankle. Fibula is intact. Ankle mortise is preserved.  IMPRESSION: 1. Probable nondisplaced fracture of the medial malleolus.   Electronically Signed   By: Trudie Reedaniel  Entrikin M.D.   On: 02/14/2014 17:17   Dg Ankle Complete Right  02/14/2014   CLINICAL DATA:  63 year old female with ankle injury and pain  EXAM: RIGHT ANKLE - COMPLETE 3+ VIEW  COMPARISON:  06/17/2004  FINDINGS: There is a possible nondisplaced medial malleolar fracture.  Lateral soft tissue swelling is noted without distal fibular fracture.  The joint spaces otherwise unremarkable.  No other focal bony abnormalities are noted.  IMPRESSION: Possible nondisplaced medial malleolar fracture - correlate clinically with pain.  Lateral soft tissue swelling without evidence of lateral malleolar fracture.   Electronically Signed   By: Laveda AbbeJeff  Hu M.D.   On: 02/14/2014 17:15   Dg Foot Complete Right  02/14/2014   CLINICAL DATA:  History of trauma from a fall complaining of pain in the right foot and ankle.  EXAM: RIGHT FOOT COMPLETE - 3+ VIEW  COMPARISON:  No priors.  FINDINGS: Nondisplaced fracture through the base of the fifth metatarsal. No other acute displaced fracture, subluxation or dislocation is noted.  IMPRESSION: 1. Nondisplaced fracture of the base of the fifth metatarsal.   Electronically Signed   By: Trudie Reedaniel  Entrikin M.D.   On: 02/14/2014 17:14     EKG Interpretation None      MDM   Final diagnoses:  Fracture of fifth metatarsal bone, right, closed, initial encounter  Medial malleolar fracture, right, closed, initial encounter    Pt with injury to the right ankle yesterday with resulting swelling and pain.  Xrays show two fractures: medial malleolus and 5th metatarsal.  Neurovascularly intact.  Placed in splint.  D/C with crutches, pain medication, orthopedic follow up.  Discussed result, findings, treatment, and follow up  with  patient.  Pt given return precautions.  Pt verbalizes understanding and agrees with plan.        Trixie Dredgemily West, PA-C 02/14/14 1851

## 2014-02-19 NOTE — ED Provider Notes (Signed)
History/physical exam/procedure(s) were performed by non-physician practitioner and as supervising physician I was immediately available for consultation/collaboration. I have reviewed all notes and am in agreement with care and plan.   Hilario Quarryanielle S Ray, MD 02/19/14 [phone]

## 2014-03-20 ENCOUNTER — Telehealth: Payer: Self-pay | Admitting: Cardiology

## 2014-03-20 NOTE — Telephone Encounter (Signed)
Closed encounter °

## 2014-04-01 ENCOUNTER — Inpatient Hospital Stay (HOSPITAL_COMMUNITY)
Admission: EM | Admit: 2014-04-01 | Discharge: 2014-04-04 | DRG: 871 | Discharge status: Against Medical Advice | Payer: Medicaid Other | Attending: Family Medicine | Admitting: Family Medicine

## 2014-04-01 ENCOUNTER — Encounter (HOSPITAL_COMMUNITY): Payer: Self-pay | Admitting: Emergency Medicine

## 2014-04-01 ENCOUNTER — Emergency Department (HOSPITAL_COMMUNITY): Payer: Medicaid Other

## 2014-04-01 DIAGNOSIS — R339 Retention of urine, unspecified: Secondary | ICD-10-CM | POA: Diagnosis not present

## 2014-04-01 DIAGNOSIS — F1210 Cannabis abuse, uncomplicated: Secondary | ICD-10-CM | POA: Diagnosis present

## 2014-04-01 DIAGNOSIS — E872 Acidosis, unspecified: Secondary | ICD-10-CM | POA: Diagnosis present

## 2014-04-01 DIAGNOSIS — F1010 Alcohol abuse, uncomplicated: Secondary | ICD-10-CM | POA: Diagnosis present

## 2014-04-01 DIAGNOSIS — K219 Gastro-esophageal reflux disease without esophagitis: Secondary | ICD-10-CM | POA: Diagnosis present

## 2014-04-01 DIAGNOSIS — N12 Tubulo-interstitial nephritis, not specified as acute or chronic: Secondary | ICD-10-CM | POA: Diagnosis present

## 2014-04-01 DIAGNOSIS — E860 Dehydration: Secondary | ICD-10-CM | POA: Diagnosis present

## 2014-04-01 DIAGNOSIS — R109 Unspecified abdominal pain: Secondary | ICD-10-CM | POA: Diagnosis not present

## 2014-04-01 DIAGNOSIS — Z8249 Family history of ischemic heart disease and other diseases of the circulatory system: Secondary | ICD-10-CM

## 2014-04-01 DIAGNOSIS — Z791 Long term (current) use of non-steroidal anti-inflammatories (NSAID): Secondary | ICD-10-CM

## 2014-04-01 DIAGNOSIS — I10 Essential (primary) hypertension: Secondary | ICD-10-CM | POA: Diagnosis present

## 2014-04-01 DIAGNOSIS — Z833 Family history of diabetes mellitus: Secondary | ICD-10-CM

## 2014-04-01 DIAGNOSIS — F32A Depression, unspecified: Secondary | ICD-10-CM

## 2014-04-01 DIAGNOSIS — R1084 Generalized abdominal pain: Secondary | ICD-10-CM

## 2014-04-01 DIAGNOSIS — R519 Headache, unspecified: Secondary | ICD-10-CM

## 2014-04-01 DIAGNOSIS — R34 Anuria and oliguria: Secondary | ICD-10-CM | POA: Diagnosis present

## 2014-04-01 DIAGNOSIS — A419 Sepsis, unspecified organism: Principal | ICD-10-CM | POA: Diagnosis present

## 2014-04-01 DIAGNOSIS — R6520 Severe sepsis without septic shock: Secondary | ICD-10-CM

## 2014-04-01 DIAGNOSIS — R6521 Severe sepsis with septic shock: Secondary | ICD-10-CM

## 2014-04-01 DIAGNOSIS — F1410 Cocaine abuse, uncomplicated: Secondary | ICD-10-CM | POA: Diagnosis present

## 2014-04-01 DIAGNOSIS — B1920 Unspecified viral hepatitis C without hepatic coma: Secondary | ICD-10-CM | POA: Diagnosis present

## 2014-04-01 DIAGNOSIS — N179 Acute kidney failure, unspecified: Secondary | ICD-10-CM | POA: Diagnosis present

## 2014-04-01 DIAGNOSIS — F17200 Nicotine dependence, unspecified, uncomplicated: Secondary | ICD-10-CM | POA: Diagnosis present

## 2014-04-01 DIAGNOSIS — F329 Major depressive disorder, single episode, unspecified: Secondary | ICD-10-CM | POA: Diagnosis present

## 2014-04-01 DIAGNOSIS — F10129 Alcohol abuse with intoxication, unspecified: Secondary | ICD-10-CM

## 2014-04-01 DIAGNOSIS — F3289 Other specified depressive episodes: Secondary | ICD-10-CM | POA: Diagnosis present

## 2014-04-01 DIAGNOSIS — K7460 Unspecified cirrhosis of liver: Secondary | ICD-10-CM

## 2014-04-01 DIAGNOSIS — B182 Chronic viral hepatitis C: Secondary | ICD-10-CM | POA: Diagnosis present

## 2014-04-01 DIAGNOSIS — R918 Other nonspecific abnormal finding of lung field: Secondary | ICD-10-CM | POA: Diagnosis present

## 2014-04-01 DIAGNOSIS — N10 Acute tubulo-interstitial nephritis: Secondary | ICD-10-CM | POA: Diagnosis present

## 2014-04-01 DIAGNOSIS — R7309 Other abnormal glucose: Secondary | ICD-10-CM | POA: Diagnosis present

## 2014-04-01 DIAGNOSIS — R001 Bradycardia, unspecified: Secondary | ICD-10-CM

## 2014-04-01 DIAGNOSIS — R51 Headache: Secondary | ICD-10-CM

## 2014-04-01 DIAGNOSIS — I959 Hypotension, unspecified: Secondary | ICD-10-CM

## 2014-04-01 DIAGNOSIS — R768 Other specified abnormal immunological findings in serum: Secondary | ICD-10-CM

## 2014-04-01 LAB — CBC WITH DIFFERENTIAL/PLATELET
Basophils Absolute: 0 10*3/uL (ref 0.0–0.1)
Basophils Relative: 0 % (ref 0–1)
EOS PCT: 0 % (ref 0–5)
Eosinophils Absolute: 0 10*3/uL (ref 0.0–0.7)
HEMATOCRIT: 33.8 % — AB (ref 36.0–46.0)
HEMOGLOBIN: 12.1 g/dL (ref 12.0–15.0)
LYMPHS ABS: 1.5 10*3/uL (ref 0.7–4.0)
Lymphocytes Relative: 5 % — ABNORMAL LOW (ref 12–46)
MCH: 33.5 pg (ref 26.0–34.0)
MCHC: 35.8 g/dL (ref 30.0–36.0)
MCV: 93.6 fL (ref 78.0–100.0)
MONOS PCT: 5 % (ref 3–12)
Monocytes Absolute: 1.5 10*3/uL — ABNORMAL HIGH (ref 0.1–1.0)
NEUTROS ABS: 26.9 10*3/uL — AB (ref 1.7–7.7)
Neutrophils Relative %: 90 % — ABNORMAL HIGH (ref 43–77)
Platelets: 165 10*3/uL (ref 150–400)
RBC: 3.61 MIL/uL — AB (ref 3.87–5.11)
RDW: 14.1 % (ref 11.5–15.5)
WBC Morphology: INCREASED
WBC: 29.9 10*3/uL — AB (ref 4.0–10.5)

## 2014-04-01 LAB — URINALYSIS, ROUTINE W REFLEX MICROSCOPIC
GLUCOSE, UA: NEGATIVE mg/dL
KETONES UR: 15 mg/dL — AB
Nitrite: POSITIVE — AB
PH: 5.5 (ref 5.0–8.0)
Protein, ur: >300 mg/dL — AB
SPECIFIC GRAVITY, URINE: 1.02 (ref 1.005–1.030)
Urobilinogen, UA: 1 mg/dL (ref 0.0–1.0)

## 2014-04-01 LAB — COMPREHENSIVE METABOLIC PANEL
ALT: 32 U/L (ref 0–35)
AST: 42 U/L — ABNORMAL HIGH (ref 0–37)
Albumin: 2.5 g/dL — ABNORMAL LOW (ref 3.5–5.2)
Alkaline Phosphatase: 79 U/L (ref 39–117)
Anion gap: 19 — ABNORMAL HIGH (ref 5–15)
BUN: 40 mg/dL — ABNORMAL HIGH (ref 6–23)
CO2: 23 mEq/L (ref 19–32)
Calcium: 8.9 mg/dL (ref 8.4–10.5)
Chloride: 95 mEq/L — ABNORMAL LOW (ref 96–112)
Creatinine, Ser: 4.95 mg/dL — ABNORMAL HIGH (ref 0.50–1.10)
GFR calc Af Amer: 10 mL/min — ABNORMAL LOW (ref >90)
GFR calc non Af Amer: 9 mL/min — ABNORMAL LOW (ref >90)
Glucose, Bld: 166 mg/dL — ABNORMAL HIGH (ref 70–99)
Potassium: 3.7 mEq/L (ref 3.7–5.3)
Sodium: 137 mEq/L (ref 137–147)
Total Bilirubin: 1 mg/dL (ref 0.3–1.2)
Total Protein: 7.9 g/dL (ref 6.0–8.3)

## 2014-04-01 LAB — I-STAT CHEM 8, ED
BUN: 38 mg/dL — AB (ref 6–23)
CALCIUM ION: 1.08 mmol/L — AB (ref 1.13–1.30)
CHLORIDE: 95 meq/L — AB (ref 96–112)
Creatinine, Ser: 5.5 mg/dL — ABNORMAL HIGH (ref 0.50–1.10)
GLUCOSE: 172 mg/dL — AB (ref 70–99)
HCT: 39 % (ref 36.0–46.0)
Hemoglobin: 13.3 g/dL (ref 12.0–15.0)
POTASSIUM: 3.4 meq/L — AB (ref 3.7–5.3)
Sodium: 136 mEq/L — ABNORMAL LOW (ref 137–147)
TCO2: 23 mmol/L (ref 0–100)

## 2014-04-01 LAB — URINE MICROSCOPIC-ADD ON

## 2014-04-01 LAB — CBG MONITORING, ED
Glucose-Capillary: 168 mg/dL — ABNORMAL HIGH (ref 70–99)
Glucose-Capillary: 112 mg/dL — ABNORMAL HIGH (ref 70–99)

## 2014-04-01 LAB — POC OCCULT BLOOD, ED: Fecal Occult Bld: NEGATIVE

## 2014-04-01 LAB — RAPID URINE DRUG SCREEN, HOSP PERFORMED
AMPHETAMINES: NOT DETECTED
BENZODIAZEPINES: NOT DETECTED
Barbiturates: NOT DETECTED
Cocaine: POSITIVE — AB
OPIATES: NOT DETECTED
Tetrahydrocannabinol: POSITIVE — AB

## 2014-04-01 LAB — I-STAT TROPONIN, ED: Troponin i, poc: 0 ng/mL (ref 0.00–0.08)

## 2014-04-01 LAB — LIPASE, BLOOD: Lipase: 19 U/L (ref 11–59)

## 2014-04-01 MED ORDER — LORAZEPAM 2 MG/ML IJ SOLN: 2.0000 mg | INTRAMUSCULAR | Status: DC | PRN

## 2014-04-01 MED ORDER — SODIUM CHLORIDE 0.9 % IV SOLN
Freq: Once | INTRAVENOUS | Status: AC
  Administered 2014-04-01: 21:00:00 via INTRAVENOUS

## 2014-04-01 MED ORDER — TRAMADOL HCL 50 MG PO TABS: 25.0000 mg | ORAL_TABLET | Freq: Four times a day (QID) | ORAL | Status: DC | PRN

## 2014-04-01 MED ORDER — HEPARIN SODIUM (PORCINE) 5000 UNIT/ML IJ SOLN
5000.0000 [IU] | Freq: Three times a day (TID) | INTRAMUSCULAR | Status: DC
  Administered 2014-04-01 – 2014-04-03 (×6): 5000 [IU] via SUBCUTANEOUS
  Filled 2014-04-01 (×11): qty 1

## 2014-04-01 MED ORDER — SODIUM CHLORIDE 0.9 % IV BOLUS (SEPSIS)
1000.0000 mL | Freq: Once | INTRAVENOUS | Status: AC
  Administered 2014-04-01: 1000 mL via INTRAVENOUS

## 2014-04-01 MED ORDER — SULFAMETHOXAZOLE-TMP DS 800-160 MG PO TABS: 1.0000 | ORAL_TABLET | Freq: Once | ORAL | Status: DC

## 2014-04-01 MED ORDER — LEVOFLOXACIN IN D5W 750 MG/150ML IV SOLN
750.0000 mg | Freq: Once | INTRAVENOUS | Status: AC
  Administered 2014-04-01: 750 mg via INTRAVENOUS
  Filled 2014-04-01: qty 150

## 2014-04-01 MED ORDER — HYDROCODONE-ACETAMINOPHEN 5-325 MG PO TABS
1.0000 | ORAL_TABLET | ORAL | Status: DC | PRN
  Administered 2014-04-01 – 2014-04-03 (×2): 2 via ORAL
  Administered 2014-04-04: 1 via ORAL
  Filled 2014-04-01: qty 1
  Filled 2014-04-01 (×2): qty 2

## 2014-04-01 MED ORDER — SODIUM CHLORIDE 0.9 % IJ SOLN
3.0000 mL | Freq: Two times a day (BID) | INTRAMUSCULAR | Status: DC
  Administered 2014-04-01 – 2014-04-03 (×4): 3 mL via INTRAVENOUS

## 2014-04-01 NOTE — ED Notes (Signed)
Lab at bedside

## 2014-04-01 NOTE — ED Provider Notes (Signed)
CSN: [id number]     Arrival date & time 04/01/14  1233 History   First MD Initiated Contact with Patient 04/01/14 1256     Chief Complaint  Patient presents with  . Blurred Vision  . Dysuria  . Abdominal Pain     (Consider location/radiation/quality/duration/timing/severity/associated sxs/prior Treatment) HPI Pt is a 63yo female with hx of HTN, Etoh abuse, substance abuse, mental disorder and depression presenting to ED with c/o dizziness that started this morning, associated with blurred vision in both eyes, headache and anuria x24 hours, and diffuse abdominal pain.  Pt also reports excessive thirst.  Abdominal pain is constant, waxing and waning, generalized, radiating into left flank and left chest.  Pain is 8/10 at worst. Denies hx of similar symptoms.   Past Medical History  Diagnosis Date  . Hypertension   . ETOH abuse   . Substance abuse   . Mental disorder   . Depression    Past Surgical History  Procedure Laterality Date  . Cholecystectomy    . Tubal ligation     Family History  Problem Relation Age of Onset  . Diabetes Mother   . Hypertension Mother   . Cancer Father    History  Substance Use Topics  . Smoking status: Current Every Day Smoker -- 0.50 packs/day for 40 years    Types: Cigarettes  . Smokeless tobacco: Not on file  . Alcohol Use: 12.0 oz/week    20 Shots of liquor per week     Comment: quitting   OB History   Grav Para Term Preterm Abortions TAB SAB Ect Mult Living                 Review of Systems  Constitutional: Negative for fever and chills.  Eyes: Positive for visual disturbance. Negative for photophobia and pain.  Respiratory: Negative for cough and shortness of breath.   Cardiovascular: Positive for chest pain. Negative for palpitations and leg swelling.  Gastrointestinal: Positive for abdominal pain. Negative for nausea, vomiting, diarrhea, constipation and blood in stool.  Endocrine: Positive for polydipsia. Negative for polyphagia  and polyuria.  Genitourinary: Positive for decreased urine volume. Negative for dysuria, frequency, hematuria and flank pain.  Musculoskeletal: Negative for back pain and myalgias.  Neurological: Positive for dizziness, weakness, light-headedness and headaches. Negative for syncope and numbness.  All other systems reviewed and are negative.     Allergies  Penicillins  Home Medications   Prior to Admission medications   Medication Sig Start Date End Date Taking? Authorizing Provider  esomeprazole (NEXIUM) 40 MG capsule Take 40 mg by mouth daily at 12 noon.   Yes Historical Provider, MD  gabapentin (NEURONTIN) 300 MG capsule Take 300 mg by mouth 2 (two) times daily.   Yes Historical Provider, MD  lisinopril-hydrochlorothiazide (PRINZIDE,ZESTORETIC) 10-12.5 MG per tablet Take 1 tablet by mouth daily.   Yes Historical Provider, MD   BP 90/57  Pulse 68  Temp(Src) 98.2 F (36.8 C) (Oral)  Resp 22  Wt 139 lb (63.05 kg)  SpO2 100%  LMP 07/27/1997 Physical Exam  Nursing note and vitals reviewed. Constitutional: She is oriented to person, place, and time. No distress.  Thin appearing female lying on exam bed, non-toxic appearing, NAD  HENT:  Head: Normocephalic and atraumatic.  Mouth/Throat: Mucous membranes are dry.  Eyes: Conjunctivae are normal. No scleral icterus.  Neck: Normal range of motion.  Cardiovascular: Normal rate, regular rhythm and normal heart sounds.   Regular rate and rhythm  Pulmonary/Chest: Effort  normal and breath sounds normal. No respiratory distress. She has no wheezes. She has no rales. She exhibits no tenderness.  No respiratory distress, able to speak in full sentences w/o difficulty. Lungs: CTAB  Abdominal: Soft. Bowel sounds are normal. She exhibits no distension and no mass. There is tenderness. There is no rebound and no guarding.  Musculoskeletal: Normal range of motion.  Neurological: She is alert and oriented to person, place, and time.  Alert and  oriented, answering questions appropriately  Skin: Skin is warm and dry. She is not diaphoretic.    ED Course  Procedures   CRITICAL CARE Performed by: Junius Finner'MALLEY, Akiya Morr A.   Total critical care time: 30min  Critical care time was exclusive of separately billable procedures and treating other patients.  Critical care was necessary to treat or prevent imminent or life-threatening deterioration.  Critical care was time spent personally by me on the following activities: development of treatment plan with patient and/or surrogate as well as nursing, discussions with consultants, evaluation of patient's response to treatment, examination of patient, obtaining history from patient or surrogate, ordering and performing treatments and interventions, ordering and review of laboratory studies, ordering and review of radiographic studies, pulse oximetry and re-evaluation of patient's condition.  Labs Review Labs Reviewed  CBC WITH DIFFERENTIAL - Abnormal; Notable for the following:    WBC 29.9 (*)    RBC 3.61 (*)    HCT 33.8 (*)    Neutrophils Relative % 90 (*)    Lymphocytes Relative 5 (*)    Neutro Abs 26.9 (*)    Monocytes Absolute 1.5 (*)    All other components within normal limits  URINALYSIS, ROUTINE W REFLEX MICROSCOPIC - Abnormal; Notable for the following:    Color, Urine AMBER (*)    APPearance TURBID (*)    Hgb urine dipstick LARGE (*)    Bilirubin Urine MODERATE (*)    Ketones, ur 15 (*)    Protein, ur >300 (*)    Nitrite POSITIVE (*)    Leukocytes, UA LARGE (*)    All other components within normal limits  COMPREHENSIVE METABOLIC PANEL - Abnormal; Notable for the following:    Chloride 95 (*)    Glucose, Bld 166 (*)    BUN 40 (*)    Creatinine, Ser 4.95 (*)    Albumin 2.5 (*)    AST 42 (*)    GFR calc non Af Amer 9 (*)    GFR calc Af Amer 10 (*)    Anion gap 19 (*)    All other components within normal limits  URINE MICROSCOPIC-ADD ON - Abnormal; Notable for the  following:    Squamous Epithelial / LPF FEW (*)    Bacteria, UA MANY (*)    Casts HYALINE CASTS (*)    Crystals CA OXALATE CRYSTALS (*)    All other components within normal limits  I-STAT CHEM 8, ED - Abnormal; Notable for the following:    Sodium 136 (*)    Potassium 3.4 (*)    Chloride 95 (*)    BUN 38 (*)    Creatinine, Ser 5.50 (*)    Glucose, Bld 172 (*)    Calcium, Ion 1.08 (*)    All other components within normal limits  CBG MONITORING, ED - Abnormal; Notable for the following:    Glucose-Capillary 112 (*)    All other components within normal limits  CBG MONITORING, ED - Abnormal; Notable for the following:    Glucose-Capillary 168 (*)  All other components within normal limits  URINE CULTURE  CULTURE, BLOOD (ROUTINE X 2)  CULTURE, BLOOD (ROUTINE X 2)  LIPASE, BLOOD  I-STAT TROPOININ, ED  POC OCCULT BLOOD, ED  POC OCCULT BLOOD, ED    Imaging Review Ct Abdomen Pelvis Wo Contrast  04/01/2014   CLINICAL DATA:  Abdominal pain. Dysuria. Alcohol abuse. Substance abuse.  EXAM: CT ABDOMEN AND PELVIS WITHOUT CONTRAST  TECHNIQUE: Multidetector CT imaging of the abdomen and pelvis was performed following the standard protocol without IV contrast.  COMPARISON:  None.  FINDINGS: No focal hepatic abnormality. Liver contour is slightly irregular suggesting chronic hepatocellular disease and/or cirrhosis. No focal splenic abnormality. Pancreas is unremarkable. Cholecystectomy. No significant biliary distention. Perigastric, peripancreatic, perisplenic, and retroperitoneal serpiginous densities are noted. These may represent varices.  Adrenals are normal. The right kidney is enlarged. Adjacent perirenal fat plain stranding present. Infectious or infiltrating process in the right kidney cannot be excluded. 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 However a renal pelvic stone cannot be  excluded. No definite hydronephrosis is identified. Left kidney is unremarkable. Foley catheter is present in the bladder. The bladder is nondistended. Uterus and adnexa are unremarkable. No significant free pelvic fluid.  Shotty inguinal lymph nodes. Retroperitoneal nodular densities as noted above. Although these may represent lymph nodes nodes, given their serpiginous nature, they most likely represent varices. Aortoiliac atherosclerotic vascular calcification and ectasia noted. Renal artery atherosclerotic vascular disease.  Appendix normal. No bowel obstruction. No free air. A supraumbilical midline abdominal tiny hernia is present. There is herniation of a loop of small bowel. No evidence of bowel obstruction. No inflammatory changes noted in the region of the hernia.  Heart size normal. Basilar atelectasis. 7 mm nodule right lower lobe, image number 11/series 202. No acute bony abnormality. Degenerative changes lumbar spine and both hips. Punctate sclerotic density in the L5 vertebral body, most likely bone island, no other sclerotic lesion noted to suggest blastic metastatic disease.  IMPRESSION: 1. Hepatic contour is irregular. This suggests cirrhosis. Serpiginous nodular densities are noted in the perigastric, peripancreatic, perisplenic, and retroperitoneal regions suggesting varices. 2. Right renal enlargement with right perirenal fat plane stranding . Infectious or infiltrating right renal process exclude pyelonephritis cannot be excluded. Small calcific density noted in the right renal pelvis region. 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 However tiny right renal pelvic stone cannot be completely excluded. No hydronephrosis on the right is present . Foley catheter is in the bladder. The bladder is nondistended. 3. 7 mm pulmonary nodule right lower lobe. If the patient is at high risk for bronchogenic carcinoma, follow-up chest CT at  3-14months is recommended. If the patient is at low risk for bronchogenic carcinoma, follow-up chest CT at 6-12 months is recommended. This recommendation follows the consensus statement: Guidelines for Management of Small Pulmonary Nodules Detected on CT Scans: A Statement from the Fleischner Society as published in Radiology 2005; 237:395-400.   Electronically Signed   By: Maisie Fus  Register   On: 04/01/2014 14:29     EKG Interpretation   Date/Time:  Thursday April 01 2014 12:57:53 EDT Ventricular Rate:  84 PR Interval:  88 QRS Duration: 74 QT Interval:  359 QTC Calculation: 424 R Axis:   67 Text Interpretation:  Age not entered, assumed to be  63 years old for  purpose of ECG  interpretation Ectopic atrial rhythm Short PR interval  Borderline T abnormalities, diffuse leads No significant change since last  tracing Confirmed by Ethelda Chick  MD, SAM [phone]) on 04/01/2014 2:24:30 PM      MDM   Final diagnoses:  Acute renal failure, unspecified acute renal failure type  Hypotension, unspecified hypotension type  Generalized abdominal pain  Acute nonintractable headache, unspecified headache type    Pt presenting to ED with c/o anuria x24 hours, abdominal pain, headache and dizziness.  Pt denies hematemesis or hematochezia.  Pt given 2L IV fluids as she had BP of 72/50 upon arrival to ED.  BP still 69/54, will consult critical care for admission for ARF, Cr: 5.5, no hx of same. Will also consult nephrology.  UA pending.  CT abd: hepatic contour irregular, suggests cirrhosis.  Right renal enlargement with right perirenal fat plane stranding.  Infectious or infiltrating right renal process, pyelonephritis cannot be excluded.   Discussed pt with Dr. Ethelda Chick who also examined pt, agrees with tx plan.  Will give 3rd liter IV fluids while waiting for critical care consult.    2:56 PM: Consulted with critical care who agrees with given pt 3rd liter of fluids, will come evaluate pt for  admission.  Also consulted with nephrology Dr. Verna Czech who agreed to follow up with pt during admission.  UA significant for UTI.  Will start pt on Levaquin.    4:22 PM BP responding to 3rd liter of fluids.  Pt has been evaluated by critical care.  Due to pt responding well to IV fluids, pt is stable for med-surg.  Pt does not have a PCP.    Pt will be admitted to a stepdown bed with internal medicine, attending- Dr. Lum Babe.        Junius Finner, PA-C 04/01/14 [phone]

## 2014-04-01 NOTE — ED Notes (Signed)
Patient transported to CT 

## 2014-04-01 NOTE — ED Notes (Signed)
MD at bedside. 

## 2014-04-01 NOTE — ED Provider Notes (Signed)
Low 5 caveat and stable vital signs. Patient complains of lower bowel pain for the past 3 days accompanied by generalized weakness and blurred vision. He also reports no urine output for the past 24 hours. On exam patient alert Glasgow Coma Score 15 no distress HEENT exam no facial asymmetry extremities dry neck supple trachea lungs clear auscultation heart regular rate and rhythm abdomen nondistended minimal tenderness the lower quadrants bilaterally extremities without edema. Patient noted to be hypotensive. Fluid bolus normal saline 2 L ordered. CT scan without contrast ordered as patient noted to be in renal failure. 2:45 PM patient remains hypotensive after saline 2 L intravenously. She is comfortable alert Glasgow Coma Score 15 asymptomatic Critical care called and will direct patient. Nephrology service consulted as well Results for orders placed during the hospital encounter of 04/01/14  CBC WITH DIFFERENTIAL      Result Value Ref Range   WBC 29.9 (*) 4.0 - 10.5 K/uL   RBC 3.61 (*) 3.87 - 5.11 MIL/uL   Hemoglobin 12.1  12.0 - 15.0 g/dL   HCT 92.1 (*) 19.4 - 17.4 %   MCV 93.6  78.0 - 100.0 fL   MCH 33.5  26.0 - 34.0 pg   MCHC 35.8  30.0 - 36.0 g/dL   RDW 08.1  44.8 - 18.5 %   Platelets 165  150 - 400 K/uL   Neutrophils Relative % 90 (*) 43 - 77 %   Lymphocytes Relative 5 (*) 12 - 46 %   Monocytes Relative 5  3 - 12 %   Eosinophils Relative 0  0 - 5 %   Basophils Relative 0  0 - 1 %   Neutro Abs 26.9 (*) 1.7 - 7.7 K/uL   Lymphs Abs 1.5  0.7 - 4.0 K/uL   Monocytes Absolute 1.5 (*) 0.1 - 1.0 K/uL   Eosinophils Absolute 0.0  0.0 - 0.7 K/uL   Basophils Absolute 0.0  0.0 - 0.1 K/uL   WBC Morphology INCREASED BANDS (>20% BANDS)    COMPREHENSIVE METABOLIC PANEL      Result Value Ref Range   Sodium 137  137 - 147 mEq/L   Potassium 3.7  3.7 - 5.3 mEq/L   Chloride 95 (*) 96 - 112 mEq/L   CO2 23  19 - 32 mEq/L   Glucose, Bld 166 (*) 70 - 99 mg/dL   BUN 40 (*) 6 - 23 mg/dL   Creatinine,  Ser 6.31 (*) 0.50 - 1.10 mg/dL   Calcium 8.9  8.4 - 49.7 mg/dL   Total Protein 7.9  6.0 - 8.3 g/dL   Albumin 2.5 (*) 3.5 - 5.2 g/dL   AST 42 (*) 0 - 37 U/L   ALT 32  0 - 35 U/L   Alkaline Phosphatase 79  39 - 117 U/L   Total Bilirubin 1.0  0.3 - 1.2 mg/dL   GFR calc non Af Amer 9 (*) >90 mL/min   GFR calc Af Amer 10 (*) >90 mL/min   Anion gap 19 (*) 5 - 15  LIPASE, BLOOD      Result Value Ref Range   Lipase 19  11 - 59 U/L  I-STAT CHEM 8, ED      Result Value Ref Range   Sodium 136 (*) 137 - 147 mEq/L   Potassium 3.4 (*) 3.7 - 5.3 mEq/L   Chloride 95 (*) 96 - 112 mEq/L   BUN 38 (*) 6 - 23 mg/dL   Creatinine, Ser 0.26 (*) 0.50 -  1.10 mg/dL   Glucose, Bld 161 (*) 70 - 99 mg/dL   Calcium, Ion 0.96 (*) 1.13 - 1.30 mmol/L   TCO2 23  0 - 100 mmol/L   Hemoglobin 13.3  12.0 - 15.0 g/dL   HCT 04.5  40.9 - 81.1 %  CBG MONITORING, ED      Result Value Ref Range   Glucose-Capillary 112 (*) 70 - 99 mg/dL  I-STAT TROPOININ, ED      Result Value Ref Range   Troponin i, poc 0.00  0.00 - 0.08 ng/mL   Comment 3           POC OCCULT BLOOD, ED      Result Value Ref Range   Fecal Occult Bld NEGATIVE  NEGATIVE  CBG MONITORING, ED      Result Value Ref Range   Glucose-Capillary 168 (*) 70 - 99 mg/dL   Ct Abdomen Pelvis Wo Contrast  04/01/2014   CLINICAL DATA:  Abdominal pain. Dysuria. Alcohol abuse. Substance abuse.  EXAM: CT ABDOMEN AND PELVIS WITHOUT CONTRAST  TECHNIQUE: Multidetector CT imaging of the abdomen and pelvis was performed following the standard protocol without IV contrast.  COMPARISON:  None.  FINDINGS: No focal hepatic abnormality. Liver contour is slightly irregular suggesting chronic hepatocellular disease and/or cirrhosis. No focal splenic abnormality. Pancreas is unremarkable. Cholecystectomy. No significant biliary distention. Perigastric, peripancreatic, perisplenic, and retroperitoneal serpiginous densities are noted. These may represent varices.  Adrenals are normal. The  right kidney is enlarged. Adjacent perirenal fat plain stranding present. Infectious or infiltrating process in the right kidney cannot be excluded. 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 However a renal pelvic stone cannot be excluded. No definite hydronephrosis is identified. Left kidney is unremarkable. Foley catheter is present in the bladder. The bladder is nondistended. Uterus and adnexa are unremarkable. No significant free pelvic fluid.  Shotty inguinal lymph nodes. Retroperitoneal nodular densities as noted above. Although these may represent lymph nodes nodes, given their serpiginous nature, they most likely represent varices. Aortoiliac atherosclerotic vascular calcification and ectasia noted. Renal artery atherosclerotic vascular disease.  Appendix normal. No bowel obstruction. No free air. A supraumbilical midline abdominal tiny hernia is present. There is herniation of a loop of small bowel. No evidence of bowel obstruction. No inflammatory changes noted in the region of the hernia.  Heart size normal. Basilar atelectasis. 7 mm nodule right lower lobe, image number 11/series 202. No acute bony abnormality. Degenerative changes lumbar spine and both hips. Punctate sclerotic density in the L5 vertebral body, most likely bone island, no other sclerotic lesion noted to suggest blastic metastatic disease.  IMPRESSION: 1. Hepatic contour is irregular. This suggests cirrhosis. Serpiginous nodular densities are noted in the perigastric, peripancreatic, perisplenic, and retroperitoneal regions suggesting varices. 2. Right renal enlargement with right perirenal fat plane stranding . Infectious or infiltrating right renal process exclude pyelonephritis cannot be excluded. Small calcific density noted in the right renal pelvis region. This is most likely renal vascular, possibly calcification in a tiny 7  mm right renal artery aneurysm. However tiny right renal pelvic stone cannot be completely excluded. No hydronephrosis on the right is present . Foley catheter is in the bladder. The bladder is nondistended. 3. 7 mm pulmonary nodule right lower lobe. If the patient is at high risk for bronchogenic carcinoma, follow-up chest CT at 3-45months is recommended. If the patient is at low risk for  bronchogenic carcinoma, follow-up chest CT at 6-12 months is recommended. This recommendation follows the consensus statement: Guidelines for Management of Small Pulmonary Nodules Detected on CT Scans: A Statement from the Fleischner Society as published in Radiology 2005; 237:395-400.   Electronically Signed   By: Maisie Fushomas  Register   On: 04/01/2014 14:29    CRITICAL CARE Performed by: Doug SouJACUBOWITZ,Imran Nuon Total critical care time: 30 minute Critical care time was exclusive of separately billable procedures and treating other patients. Critical care was necessary to treat or prevent imminent or life-threatening deterioration. Critical care was time spent personally by me on the following activities: development of treatment plan with patient and/or surrogate as well as nursing, discussions with consultants, evaluation of patient's response to treatment, examination of patient, obtaining history from patient or surrogate, ordering and performing treatments and interventions, ordering and review of laboratory studies, ordering and review of radiographic studies, pulse oximetry and re-evaluation of patient's condition.  Doug SouSam Kelcey Wickstrom, MD 04/01/14 1504

## 2014-04-01 NOTE — H&P (Signed)
Family Medicine Teaching [facility] Admission History and Physical Service Pager: [phone]  Patient name: Dana Mcintosh Medical record number: [id number] Date of birth: 01/27/1951 Age: 63 y.o. Gender: female  Primary Care Provider: Default, Provider, MD Consultants: CCM, renal  Code Status: full  Chief Complaint: anuria  Assessment and Plan: TALAYAH PICARDI is a 63 y.o. female presenting with dizziness, anuria found to be hypotensive with CR of 5.5 likely 2/2 sepsis. PMH is significant for depression, substance abuse  #Sepsis likely 2/2 pyelo. CCM consulted in the ED, potentially for Pressors but improved with fluid boluses. Ucx/Bcx sent. Started on levaquin (question of whether this was done before or after cultures were collected). CT fairly convincing for pyelo. Afebrile but WBC 20s. Pt also with chronic aches and pains so exam difficult but no other clear source of infection at this time. Currently hemodynamically stable and improved since presentation.  -admit to SDU under Dr. Mauricio Po -will have low threshold to call CCM for pressors if BP worsens -goal MAP >65 -cont with IVF resusc -cont levaquin, may need to broad to cefepime? However will hold for now (pt is pCN allergic) -trend fever curve and WBCs with daily CBC  #Aki assc with anuria- Last Cr 0.99 in 12/13. Now 5 Apparently pt has also been taking NSAIDs chronically for regular aches. Also previously on an ACE?Marland Kitchen Renal consulted in the ED; s/p 5L boluses -bolus on more time -start IVF @ 125, with low threshold to increase -hold nephrotoxic meds -daily renal fxn panels -renal following, apprec recs -strict i/os   #Hyperglycemia and hx of polydipsia- potentially related to acute stressors, difficult to say at this point in time -Ha1C -will trend cbgs  #Headache- likely 2/2 dehydration and infection; no CN deficits; potentially at risk for intracranial pathology given smoking hx but low suspicion at this time  -will  rehydrate and reassess in the morning -not good candidate for tylenol   #hx of HTN- on thiazide and ACE -will trend vitals -would avoid sending pt out on these meds given renal fxn  #Cirrhosis?? Noted incidentally on CT scan; afraid she may partake in high risk activities given her substance abuse; AST 42/ALT 32; albumin 2.5  -hep panel  -HIV -would recommend close f/up with [place] and potential biopsy -ETOH level  #Substance abuse- reporting 3 +shots of vodka in the ED, but suspect more; also potentially has a psychiatric history which certainly makes addiction more difficult  -will start on CIWA -consult SW -UDS   #Tobacco abuse -nicotine patch prn -counseling per nursing  #Pulm nodule- with a smoking history highly concerning for malignancy -repeat CT in 3-6 months  FEN/GI: reg diet  Prophylaxis: hep sq  Disposition: admit to SDU under Dr. Mauricio Po need primary care doctor   History of Present Illness: Dana Mcintosh is a 63 y.o. female presenting with dizziness of sudden onset this morning. Pt also c/o headache and blurred vision in addition to abdominal discomfort as well as some polydipsia. Abdominal pain described as intermittent with radiation to the left flank. However what seems to have been most worrisome to pt was her lack of urination in the last 24 hrs.  In the ED pt was noted to have a Cr of 5.5 and U/A grossly infected. No prior renal hx. Initial pressure 70/40 obtained, given multiple boluses of fluids. CCM called for possible admission. Bcx, Ucx obtained. Started levaquin per EDP (questionably before abx given).   Review Of Systems: Per HPI with the following additions:  none Otherwise 12 point review of systems was performed and was unremarkable.  Patient Active Problem List   Diagnosis Date Noted  . Acute renal failure 04/01/2014  . Depressive disorder 07/31/2012  . Alcohol abuse with intoxication 07/30/2012    Class: Acute  . Cocaine abuse 07/30/2012    Class:  Acute  . Cannabis abuse 07/30/2012    Class: Acute   Past Medical History: Past Medical History  Diagnosis Date  . Hypertension   . ETOH abuse   . Substance abuse   . Mental disorder   . Depression    Past Surgical History: Past Surgical History  Procedure Laterality Date  . Cholecystectomy    . Tubal ligation     Social History: History  Substance Use Topics  . Smoking status: Current Every Day Smoker -- 0.50 packs/day for 40 years    Types: Cigarettes  . Smokeless tobacco: Not on file  . Alcohol Use: 12.0 oz/week    20 Shots of liquor per week     Comment: quitting   Additional social history: still drinking  Please also refer to relevant sections of EMR.  Family History: Family History  Problem Relation Age of Onset  . Diabetes Mother   . Hypertension Mother   . Cancer Father    Allergies and Medications: Allergies  Allergen Reactions  . Penicillins Hives   No current facility-administered medications on file prior to encounter.   Current Outpatient Prescriptions on File Prior to Encounter  Medication Sig Dispense Refill  . gabapentin (NEURONTIN) 300 MG capsule Take 300 mg by mouth 2 (two) times daily.        Objective: BP 93/71  Pulse 71  Temp(Src) 98.2 F (36.8 C) (Oral)  Resp 18  Wt 139 lb (63.05 kg)  SpO2 98%  LMP 07/27/1997 Exam: General: thin elderly woman, sitting in bed, talkative HEENT: NCAT, PERRL (but pinpoint), no lymphadenopathy appreciated but tender on right sub-mandible, EOMI Cardiovascular: RRR, no m/r/g Respiratory: nml WOB, crackle on right lung base but otherwise CTAB Abdomen: large midline scars, well healed, S, non distended, tender diffusely but worse in suprapubic regions and pelvic bilaterally Extremities: WWP Skin: no evidence of breakdown  Neuro: CNii=xii tested and in tact; no deficits appreciated   Labs and Imaging: CBC BMET   Recent Labs Lab 04/01/14 1310 04/01/14 1324  WBC 29.9*  --   HGB 12.1 13.3  HCT  33.8* 39.0  PLT 165  --     Recent Labs Lab 04/01/14 1310 04/01/14 1324  NA 137 136*  K 3.7 3.4*  CL 95* 95*  CO2 23  --   BUN 40* 38*  CREATININE 4.95* 5.50*  GLUCOSE 166* 172*  CALCIUM 8.9  --      Urinalysis    Component Value Date/Time   COLORURINE AMBER* 04/01/2014 1430   APPEARANCEUR TURBID* 04/01/2014 1430   LABSPEC 1.020 04/01/2014 1430   PHURINE 5.5 04/01/2014 1430   GLUCOSEU NEGATIVE 04/01/2014 1430   HGBUR LARGE* 04/01/2014 1430   BILIRUBINUR MODERATE* 04/01/2014 1430   KETONESUR 15* 04/01/2014 1430   PROTEINUR >300* 04/01/2014 1430   UROBILINOGEN 1.0 04/01/2014 1430   NITRITE POSITIVE* 04/01/2014 1430   LEUKOCYTESUR LARGE* 04/01/2014 1430   CBG (last 3)   Recent Labs  04/01/14 1248 04/01/14 1407  GLUCAP 168* 112*   CT ab/pelvis IMPRESSION:  1. Hepatic contour is irregular. This suggests cirrhosis.  Serpiginous nodular densities are noted in the perigastric,  peripancreatic, perisplenic, and retroperitoneal regions suggesting  varices.  2. Right renal enlargement with right perirenal fat plane stranding  . Infectious or infiltrating right renal process exclude  pyelonephritis cannot be excluded. Small calcific density noted in  the right renal pelvis region. 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  However tiny right renal pelvic stone cannot be completely excluded.  No hydronephrosis on the right is present . Foley catheter is in the  bladder. The bladder is nondistended.  3. 7 mm pulmonary nodule right lower lobe. If the patient is at high  risk for bronchogenic carcinoma, follow-up chest CT at 3-716months is  recommended. If the patient is at low risk for bronchogenic  carcinoma, follow-up chest CT at 6-12 months is recommended. This  recommendation follows the consensus statement   Charlane FerrettiMelanie C Faatimah Spielberg, MD 04/01/2014, 6:48 PM PGY-2, [facility] Intern pager: [phone], text pages  welcome

## 2014-04-01 NOTE — ED Notes (Signed)
Pt reports waking this AM with dizziness and blurred vision in both eyes. Also reports headache, anuria x24 hours, abdominal pain. Pt reports excessive thirst. Pt awake, alert, oriented x4, NAd at present.

## 2014-04-01 NOTE — ED Notes (Signed)
Admitting at bedside 

## 2014-04-01 NOTE — ED Notes (Signed)
Meal tray ordered 

## 2014-04-01 NOTE — ED Provider Notes (Signed)
Medical screening examination/treatment/procedure(s) were conducted as a shared visit with non-physician practitioner(s) and myself.  I personally evaluated the patient during the encounter.   EKG Interpretation   Date/Time:  Thursday April 01 2014 12:57:53 EDT Ventricular Rate:  84 PR Interval:  88 QRS Duration: 74 QT Interval:  359 QTC Calculation: 424 R Axis:   67 Text Interpretation:  Age not entered, assumed to be  63 years old for  purpose of ECG interpretation Ectopic atrial rhythm Short PR interval  Borderline T abnormalities, diffuse leads No significant change since last  tracing Confirmed by Ethelda ChickJACUBOWITZ  MD, Patrick Salemi [phone] on 04/01/2014 2:24:30 PM       Doug SouSam Legrand Lasser, MD 04/01/14 1655

## 2014-04-01 NOTE — Consult Note (Signed)
Name: Dana Mcintosh MRN: [id number] DOB: Jun 22, 1951    ADMISSION DATE:  04/01/2014 CONSULTATION DATE:  04/01/2014  REFERRING MD :  EDP PRIMARY SERVICE:  TRH  CHIEF COMPLAINT:  Hypotension  BRIEF PATIENT DESCRIPTION: 63 y.o. F with PMH of HTN, Neuropathy, Depression, ETOH and substance abuse, Mental disorder, presents to [facility] ED 04/01/14 with blurry vision, no urination x 24 hours, and lower abd pain.  In ED, CT abd obtained (see results below) and found to be hypotensive.  Given 2 L and pt remained hypotensive with SBP in 70's.  PCCM was consulted.  SIGNIFICANT EVENTS / STUDIES:  8/6 ABD CT >>> irregular hepatic contour - ? Suggesting cirrhosis.  Serpiginous nodular densities in perigastric, peripancreatic, perisplenic and retroperitoneal regions suggesting varices. Right renal enlargement with right perirenal fat plane stranding.  Small calcific density in right renal pelvis region, tiny 7mm right renal aneurysm, can not exclude tiny stone.  7mm pulmonary nodule in right lower lobe.  LINES / TUBES: PIV  CULTURES: Urine 8/6 >>>  ANTIBIOTICS: Levaquin 8/6 >>>  HISTORY OF PRESENT ILLNESS:  [name] is a 63 y.o. F with PMH as outlined below.  She presented to the [facility] ED on 04/01/14 with blurry vision, lower abdominal pain, and no urination x 24 hours.  She usually urinates several times a day; however, her last time urinating was over 24 hours ago, tis was the main reason she was concerned and came to the ED.  She has also been weak for the past few days.  Denies any fevers/chills/sweats, chest pain, SOB, N/V/D.  She states that she has been drinking a lot of fluids over the past few days; however, these fluids have included Pepsi.   In ED, she was hypotensive after 2L IVF (SBP 70's) and was found to have acute kidney injury with peak SCr of 5.5 (SCr in 2013 was 0.99). Upon further questioning, pt states she has been taking 4 Naproxen pills per day for general muscle/joint pain (that she has  chronically).  She has also been taking her usual dose of Lisinopril - HCTZ. A 3rd L of IVF were ordered and after it was complete, SBP had increased to 90's and pt had made some urine.  On exam, pt appeared dry/volume down. UA was positive for UTI.  PAST MEDICAL HISTORY :  Past Medical History  Diagnosis Date  . Hypertension   . ETOH abuse   . Substance abuse   . Mental disorder   . Depression    Past Surgical History  Procedure Laterality Date  . Cholecystectomy    . Tubal ligation     Prior to Admission medications   Medication Sig Start Date End Date Taking? Authorizing Provider  esomeprazole (NEXIUM) 40 MG capsule Take 40 mg by mouth daily at 12 noon.   Yes Historical Provider, MD  gabapentin (NEURONTIN) 300 MG capsule Take 300 mg by mouth 2 (two) times daily.   Yes Historical Provider, MD  lisinopril-hydrochlorothiazide (PRINZIDE,ZESTORETIC) 10-12.5 MG per tablet Take 1 tablet by mouth daily.   Yes Historical Provider, MD   Allergies  Allergen Reactions  . Penicillins Hives    FAMILY HISTORY:  Family History  Problem Relation Age of Onset  . Diabetes Mother   . Hypertension Mother   . Cancer Father    SOCIAL HISTORY:  reports that she has been smoking Cigarettes.  She has a 20 pack-year smoking history. She does not have any smokeless tobacco history on file. She reports that  she drinks about 12 ounces of alcohol per week. She reports that she uses illicit drugs (Cocaine and Marijuana).  REVIEW OF SYSTEMS:   All negative; except for those that are bolded, which indicate positives.  Constitutional: weight loss, weight gain, night sweats, fevers, chills, fatigue, weakness.  HEENT: headaches, sore throat, sneezing, nasal congestion, post nasal drip, difficulty swallowing, tooth/dental problems, visual complaints, visual changes, ear aches. Neuro: difficulty with speech, weakness, numbness, ataxia. CV:  chest pain, orthopnea, PND, swelling in lower extremities,  dizziness, palpitations, syncope.  Resp: cough, hemoptysis, dyspnea, wheezing. GI  heartburn, indigestion, abdominal pain, nausea, vomiting, diarrhea, constipation, change in bowel habits, loss of appetite, hematemesis, melena, hematochezia.  GU: dysuria, change in color of urine, urgency or frequency, flank pain, hematuria. MSK: joint pain (chronic) or swelling, decreased range of motion. Psych: change in mood or affect, depression, anxiety, suicidal ideations, homicidal ideations. Skin: rash, itching, bruising.   SUBJECTIVE:  Feels a little better after 3L IVF.  No chest pain, SOB.  VITAL SIGNS: Temp:  [98.2 F (36.8 C)-98.6 F (37 C)] 98.2 F (36.8 C) (08/06 1310) Pulse Rate:  [69-95] 71 (08/06 1545) Resp:  [16-24] 21 (08/06 1545) BP: (69-90)/(44-71) 82/68 mmHg (08/06 1545) SpO2:  [93 %-100 %] 100 % (08/06 1545) Weight:  [63.05 kg (139 lb)] 63.05 kg (139 lb) (08/06 1240)  PHYSICAL EXAMINATION: General: Pleasant AA female, resting in bed, in NAD. Neuro: A&O x 3, non-focal.  HEENT: [place]/AT. PERRL, sclerae anicteric. Cardiovascular: RRR, no M/R/G.  Lungs: Respirations even and unlabored.  CTA bilaterally, No W/R/R. Abdomen: BS x 4, soft, mild tenderness in hypogastric region. Musculoskeletal: No gross deformities, no edema.  Skin: Intact, warm, no rashes.   Recent Labs Lab 04/01/14 1310 04/01/14 1324  NA 137 136*  K 3.7 3.4*  CL 95* 95*  CO2 23  --   BUN 40* 38*  CREATININE 4.95* 5.50*  GLUCOSE 166* 172*    Recent Labs Lab 04/01/14 1310 04/01/14 1324  HGB 12.1 13.3  HCT 33.8* 39.0  WBC 29.9*  --   PLT 165  --    Ct Abdomen Pelvis Wo Contrast  04/01/2014   CLINICAL DATA:  Abdominal pain. Dysuria. Alcohol abuse. Substance abuse.  EXAM: CT ABDOMEN AND PELVIS WITHOUT CONTRAST  TECHNIQUE: Multidetector CT imaging of the abdomen and pelvis was performed following the standard protocol without IV contrast.  COMPARISON:  None.  FINDINGS: No focal hepatic abnormality.  Liver contour is slightly irregular suggesting chronic hepatocellular disease and/or cirrhosis. No focal splenic abnormality. Pancreas is unremarkable. Cholecystectomy. No significant biliary distention. Perigastric, peripancreatic, perisplenic, and retroperitoneal serpiginous densities are noted. These may represent varices.  Adrenals are normal. The right kidney is enlarged. Adjacent perirenal fat plain stranding present. Infectious or infiltrating process in the right kidney cannot be excluded. 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 However a renal pelvic stone cannot be excluded. No definite hydronephrosis is identified. Left kidney is unremarkable. Foley catheter is present in the bladder. The bladder is nondistended. Uterus and adnexa are unremarkable. No significant free pelvic fluid.  Shotty inguinal lymph nodes. Retroperitoneal nodular densities as noted above. Although these may represent lymph nodes nodes, given their serpiginous nature, they most likely represent varices. Aortoiliac atherosclerotic vascular calcification and ectasia noted. Renal artery atherosclerotic vascular disease.  Appendix normal. No bowel obstruction. No free air. A supraumbilical midline abdominal tiny hernia is present. There is  herniation of a loop of small bowel. No evidence of bowel obstruction. No inflammatory changes noted in the region of the hernia.  Heart size normal. Basilar atelectasis. 7 mm nodule right lower lobe, image number 11/series 202. No acute bony abnormality. Degenerative changes lumbar spine and both hips. Punctate sclerotic density in the L5 vertebral body, most likely bone island, no other sclerotic lesion noted to suggest blastic metastatic disease.  IMPRESSION: 1. Hepatic contour is irregular. This suggests cirrhosis. Serpiginous nodular densities are noted in the perigastric, peripancreatic,  perisplenic, and retroperitoneal regions suggesting varices. 2. Right renal enlargement with right perirenal fat plane stranding . Infectious or infiltrating right renal process exclude pyelonephritis cannot be excluded. Small calcific density noted in the right renal pelvis region. 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 However tiny right renal pelvic stone cannot be completely excluded. No hydronephrosis on the right is present . Foley catheter is in the bladder. The bladder is nondistended. 3. 7 mm pulmonary nodule right lower lobe. If the patient is at high risk for bronchogenic carcinoma, follow-up chest CT at 3-43months is recommended. If the patient is at low risk for bronchogenic carcinoma, follow-up chest CT at 6-12 months is recommended. This recommendation follows the consensus statement: Guidelines for Management of Small Pulmonary Nodules Detected on CT Scans: A Statement from the Fleischner Society as published in Radiology 2005; 237:395-400.   Electronically Signed   By: Maisie Fus  Register   On: 04/01/2014 14:29    ASSESSMENT / PLAN:  Volume responsive hypotension s/p 3 L IVF Recs: - Have ordered 2 additional L of IVF. - Monitor hemodynamics, goal MAP > 65.  AKI - multifactorial in setting of dehydration, possible ATN from hypotension, extra doses of NSAID's on top of lisinopril-HCTZ. Recs: - IVF resuscitation as above. - Nephrology has been consulted. - Follow BMP.  Small 7mm RLL Pulmonary Nodule in a ~20 year smoker. Recs: - Needs follow up outpatient chest CT scan in 3 - 6 months.  UTI ? Right pyelonephritis Recs: - Continue Levaquin. - Follow urine culture. - Monitor fever curve / WBC's.   Rutherford Guys, PA - [facility] Pgr: [phone]  or [phone]   STAFF NOTE: I, Dr Lavinia Sharps have personally reviewed patient's available data, including medical history,  events of note, physical examination and test results as part of my evaluation. I have discussed with resident/NP and other care providers such as pharmacist, RN and RRT.  In addition,  I personally evaluated patient and elicited key findings of :  63 y.o. F with hypotension, AKI, and UTI +/- Pyelonephritis (thought doubtful).  Hypotension responded well to fluid challenge, have ordered additional fluids.  AKI likely secondary to hypotension + dehydration + nephrotoxins.  UTI being treated with Levaquin.  Continue IVF resuscitation, expect BP to continue to respond well and AKI to resolve with fluids.  PCCM will sign off, please do not hesitate to call us back if we can be of any further assistance.    Rest per NP/medical resident whose note is outlined above and that I agree with  The patient is critically ill with multiple organ systems failure and requires high complexity decision making for assessment and support, frequent evaluation and titration of therapies, application of advanced monitoring technologies and extensive interpretation of multiple databases.   Critical Care Time devoted to patient care services described in this note  is  30  Minutes.  Dr. Kalman Shan, M.D., [facility].C.P Pulmonary and Critical Care Medicine Staff Physician [facility] Pager: [phone], If no answer or between  15:00h - 7:00h: call [phone]  04/01/2014 5:45 PM

## 2014-04-01 NOTE — ED Notes (Signed)
MD at bedside; admitting

## 2014-04-01 NOTE — ED Notes (Signed)
Report attempted 

## 2014-04-01 NOTE — Consult Note (Signed)
Talbot GrumblingCarolyn R Wert 04/01/2014 Arita MissSANFORD, Leoni Goodness, B Requesting Physician:  Ethelda ChickJacubowitz  Reason for Consult:  AKI HPI:  60F w/ hx/o HTN (ACEi/Thiazide), hx/o ETOH and subtansce use presented with weakness, blurry vision for severaly days, presented with hypotension and found to have AKI.  SCr trend below.    Pt has chronic pains and takes 2 naproxen daily.  She continued to take her medications while unwell.  No recent contrasted studies. No red or tea colored urine.  No unusual rashes or joint pains.  No known liver disease but did an irregular liver (PLT, Tbili ok)  Found to have leukocytosis, L shift, suprapubic discomfort, and UA c/w UTI.  CT A/P w/o contrast with R renal enlargement, perinephric stranding.  Stone vs calcification at pelvis but no HN.     Creatinine, Ser (mg/dL)  Date Value  1/6/10968/01/2014 5.50*  04/01/2014 4.95*  08/01/2012 0.99   07/28/2012 0.50   10/08/2011 0.80   12/13/2009 0.84   03/04/2008 0.67   02/14/2008 0.62   ] I/Os:  ROS NSAIDS: as above IV Contrast none TMP/SMX no exposure Hypotension upon presentation Balance of 12 systems is negative w/ exceptions as above  PMH  Past Medical History  Diagnosis Date  . Hypertension   . ETOH abuse   . Substance abuse   . Mental disorder   . Depression    PSH  Past Surgical History  Procedure Laterality Date  . Cholecystectomy    . Tubal ligation     FH  Family History  Problem Relation Age of Onset  . Diabetes Mother   . Hypertension Mother   . Cancer Father    SH  reports that she has been smoking Cigarettes.  She has a 20 pack-year smoking history. She does not have any smokeless tobacco history on file. She reports that she drinks about 12 ounces of alcohol per week. She reports that she uses illicit drugs (Cocaine and Marijuana). Allergies  Allergies  Allergen Reactions  . Penicillins Hives   Home medications Prior to Admission medications   Medication Sig Start Date End Date Taking? Authorizing Provider   esomeprazole (NEXIUM) 40 MG capsule Take 40 mg by mouth daily at 12 noon.   Yes Historical Provider, MD  gabapentin (NEURONTIN) 300 MG capsule Take 300 mg by mouth 2 (two) times daily.   Yes Historical Provider, MD  lisinopril-hydrochlorothiazide (PRINZIDE,ZESTORETIC) 10-12.5 MG per tablet Take 1 tablet by mouth daily.   Yes Historical Provider, MD    Current Medications Current Facility-Administered Medications  Medication Dose Route Frequency Provider Last Rate Last Dose  . 0.9 %  sodium chloride infusion   Intravenous Once Junius FinnerErin O'Malley, PA-C      . levofloxacin (LEVAQUIN) IVPB 750 mg  750 mg Intravenous Once Junius FinnerErin O'Malley, PA-C       Current Outpatient Prescriptions  Medication Sig Dispense Refill  . esomeprazole (NEXIUM) 40 MG capsule Take 40 mg by mouth daily at 12 noon.      . gabapentin (NEURONTIN) 300 MG capsule Take 300 mg by mouth 2 (two) times daily.      Marland Kitchen. lisinopril-hydrochlorothiazide (PRINZIDE,ZESTORETIC) 10-12.5 MG per tablet Take 1 tablet by mouth daily.        CBC  Recent Labs Lab 04/01/14 1310 04/01/14 1324  WBC 29.9*  --   NEUTROABS 26.9*  --   HGB 12.1 13.3  HCT 33.8* 39.0  MCV 93.6  --   PLT 165  --    Basic Metabolic Panel  Recent Labs  Lab 04/01/14 1310 04/01/14 1324  NA 137 136*  K 3.7 3.4*  CL 95* 95*  CO2 23  --   GLUCOSE 166* 172*  BUN 40* 38*  CREATININE 4.95* 5.50*  CALCIUM 8.9  --    UA reviewed  Physical Exam  Blood pressure 82/68, pulse 71, temperature 98.2 F (36.8 C), temperature source Oral, resp. rate 21, weight 63.05 kg (139 lb), last menstrual period 07/27/1997, SpO2 100.00%. GEN: NAD, answers most questions appropraitely ENT: NCAT, poor dentition EYES: EOMI, no icterus CV: RRR. Nl s1s2 PULM: ctab ABD: mildly ttp in lower abdomen, no r/g. +bs. No bruits. No HSM SKIN: no rashes/lesions EXT:No LEE   Assessment 25F presenting w/ AKI (no known CKD) while taking ACEi, thiazide, NSAIDs, poor PO, w/ leukocytosis, pyuria,  and likely UTI/Right pyelonephritis.  Now with hypotension rec vol replacement, likely septic.   1. AKI, K, HCO3, BUN ok.  Likely hypovolemic + sepsis + nephrotoxins; no evidence of obstruction 2. Pyelonephritis 3. Septic Shock and hypovolemia 4. HTN on ACEi, Thiazide 5. Chronic NSAID use  PLAN 1. Agree with ongoing IVFs 2. CCM to eval, ABX per primary 3. Hold nephrotoxins 4. Daily renal panels 5. No need for RRT at current time 6. Will follow closely  Sabra Heck MD 04/01/2014, 4:27 PM

## 2014-04-02 DIAGNOSIS — F1210 Cannabis abuse, uncomplicated: Secondary | ICD-10-CM

## 2014-04-02 DIAGNOSIS — R1084 Generalized abdominal pain: Secondary | ICD-10-CM

## 2014-04-02 DIAGNOSIS — F1410 Cocaine abuse, uncomplicated: Secondary | ICD-10-CM

## 2014-04-02 DIAGNOSIS — K7460 Unspecified cirrhosis of liver: Secondary | ICD-10-CM

## 2014-04-02 DIAGNOSIS — A419 Sepsis, unspecified organism: Principal | ICD-10-CM

## 2014-04-02 DIAGNOSIS — B182 Chronic viral hepatitis C: Secondary | ICD-10-CM | POA: Diagnosis present

## 2014-04-02 DIAGNOSIS — R894 Abnormal immunological findings in specimens from other organs, systems and tissues: Secondary | ICD-10-CM

## 2014-04-02 DIAGNOSIS — R6520 Severe sepsis without septic shock: Secondary | ICD-10-CM

## 2014-04-02 DIAGNOSIS — N10 Acute tubulo-interstitial nephritis: Secondary | ICD-10-CM | POA: Diagnosis present

## 2014-04-02 LAB — COMPREHENSIVE METABOLIC PANEL
ALT: 26 U/L (ref 0–35)
AST: 49 U/L — AB (ref 0–37)
Albumin: 1.7 g/dL — ABNORMAL LOW (ref 3.5–5.2)
Alkaline Phosphatase: 75 U/L (ref 39–117)
Anion gap: 14 (ref 5–15)
BUN: 39 mg/dL — ABNORMAL HIGH (ref 6–23)
CALCIUM: 7.3 mg/dL — AB (ref 8.4–10.5)
CO2: 17 meq/L — AB (ref 19–32)
CREATININE: 3.07 mg/dL — AB (ref 0.50–1.10)
Chloride: 110 mEq/L (ref 96–112)
GFR calc Af Amer: 18 mL/min — ABNORMAL LOW (ref >90)
GFR, EST NON AFRICAN AMERICAN: 15 mL/min — AB (ref >90)
Glucose, Bld: 113 mg/dL — ABNORMAL HIGH (ref 70–99)
Potassium: 4.4 mEq/L (ref 3.7–5.3)
Sodium: 141 mEq/L (ref 137–147)
TOTAL PROTEIN: 6.3 g/dL (ref 6.0–8.3)
Total Bilirubin: 0.7 mg/dL (ref 0.3–1.2)

## 2014-04-02 LAB — HIV ANTIBODY (ROUTINE TESTING W REFLEX): HIV 1&2 Ab, 4th Generation: NONREACTIVE

## 2014-04-02 LAB — BASIC METABOLIC PANEL
ANION GAP: 14 (ref 5–15)
BUN: 38 mg/dL — AB (ref 6–23)
CO2: 18 mEq/L — ABNORMAL LOW (ref 19–32)
CREATININE: 3.51 mg/dL — AB (ref 0.50–1.10)
Calcium: 6.8 mg/dL — ABNORMAL LOW (ref 8.4–10.5)
Chloride: 107 mEq/L (ref 96–112)
GFR calc Af Amer: 15 mL/min — ABNORMAL LOW (ref >90)
GFR, EST NON AFRICAN AMERICAN: 13 mL/min — AB (ref >90)
Glucose, Bld: 96 mg/dL (ref 70–99)
POTASSIUM: 3.4 meq/L — AB (ref 3.7–5.3)
Sodium: 139 mEq/L (ref 137–147)

## 2014-04-02 LAB — CBC
HCT: 26.4 % — ABNORMAL LOW (ref 36.0–46.0)
HCT: 30.9 % — ABNORMAL LOW (ref 36.0–46.0)
HEMOGLOBIN: 11 g/dL — AB (ref 12.0–15.0)
Hemoglobin: 9.5 g/dL — ABNORMAL LOW (ref 12.0–15.0)
MCH: 33.2 pg (ref 26.0–34.0)
MCH: 33 pg (ref 26.0–34.0)
MCHC: 36 g/dL (ref 30.0–36.0)
MCHC: 35.6 g/dL (ref 30.0–36.0)
MCV: 92.3 fL (ref 78.0–100.0)
MCV: 92.8 fL (ref 78.0–100.0)
PLATELETS: 136 10*3/uL — AB (ref 150–400)
Platelets: 161 10*3/uL (ref 150–400)
RBC: 2.86 MIL/uL — ABNORMAL LOW (ref 3.87–5.11)
RBC: 3.33 MIL/uL — AB (ref 3.87–5.11)
RDW: 14.1 % (ref 11.5–15.5)
RDW: 14.3 % (ref 11.5–15.5)
WBC: 17.3 10*3/uL — ABNORMAL HIGH (ref 4.0–10.5)
WBC: 15.2 10*3/uL — AB (ref 4.0–10.5)

## 2014-04-02 LAB — HEPATITIS PANEL, ACUTE
HCV AB: REACTIVE — AB
Hep A IgM: NONREACTIVE
Hep B C IgM: NONREACTIVE
Hepatitis B Surface Ag: NEGATIVE

## 2014-04-02 LAB — ETHANOL

## 2014-04-02 LAB — CORTISOL: Cortisol, Plasma: 39 ug/dL

## 2014-04-02 LAB — HEMOGLOBIN A1C
Hgb A1c MFr Bld: 5.3 % (ref <5.7)
MEAN PLASMA GLUCOSE: 105 mg/dL (ref <117)

## 2014-04-02 LAB — TROPONIN I: Troponin I: <0.3 ng/mL (ref <0.30)

## 2014-04-02 LAB — MRSA PCR SCREENING: MRSA by PCR: NEGATIVE

## 2014-04-02 LAB — TSH: TSH: 1.58 u[IU]/mL (ref 0.350–4.500)

## 2014-04-02 MED ORDER — SODIUM CHLORIDE 0.9 % IV BOLUS (SEPSIS)
1000.0000 mL | Freq: Once | INTRAVENOUS | Status: AC
  Administered 2014-04-02: 1000 mL via INTRAVENOUS

## 2014-04-02 MED ORDER — LEVOFLOXACIN IN D5W 500 MG/100ML IV SOLN
500.0000 mg | INTRAVENOUS | Status: DC
  Administered 2014-04-03: 500 mg via INTRAVENOUS
  Filled 2014-04-02: qty 100

## 2014-04-02 MED ORDER — SODIUM CHLORIDE 0.9 % IV SOLN
INTRAVENOUS | Status: DC
  Administered 2014-04-02 – 2014-04-04 (×7): via INTRAVENOUS

## 2014-04-02 MED ORDER — DIPHENHYDRAMINE HCL 50 MG PO CAPS
50.0000 mg | ORAL_CAPSULE | Freq: Once | ORAL | Status: AC
  Administered 2014-04-02: 50 mg via ORAL
  Filled 2014-04-02: qty 2
  Filled 2014-04-02: qty 1

## 2014-04-02 MED ORDER — METHYLPREDNISOLONE SODIUM SUCC 125 MG IJ SOLR: 60.0000 mg | Freq: Four times a day (QID) | INTRAMUSCULAR | Status: DC

## 2014-04-02 MED ORDER — ENSURE COMPLETE PO LIQD
237.0000 mL | Freq: Two times a day (BID) | ORAL | Status: DC
  Administered 2014-04-02: 237 mL via ORAL

## 2014-04-02 MED ORDER — METHYLPREDNISOLONE SODIUM SUCC 125 MG IJ SOLR
60.0000 mg | Freq: Four times a day (QID) | INTRAMUSCULAR | Status: AC
  Administered 2014-04-02 (×2): 60 mg via INTRAVENOUS
  Filled 2014-04-02: qty 2
  Filled 2014-04-02 (×3): qty 0.96
  Filled 2014-04-02: qty 2

## 2014-04-02 MED ORDER — METHYLPREDNISOLONE SODIUM SUCC 125 MG IJ SOLR
60.0000 mg | Freq: Two times a day (BID) | INTRAMUSCULAR | Status: DC
  Administered 2014-04-02 – 2014-04-04 (×4): 60 mg via INTRAVENOUS
  Filled 2014-04-02 (×2): qty 0.96
  Filled 2014-04-02: qty 2
  Filled 2014-04-02 (×3): qty 0.96

## 2014-04-02 NOTE — Progress Notes (Signed)
FMTS ATTENDING  NOTE Dana Bueso,MD I  have seen and examined this patient, reviewed their chart. I have discussed this patient with the resident. I agree with the resident's findings, assessment and care plan. 

## 2014-04-02 NOTE — Progress Notes (Signed)
INITIAL NUTRITION ASSESSMENT  DOCUMENTATION CODES Per approved criteria  -Not Applicable   INTERVENTION: Ensure Complete po BID, each supplement provides 350 kcal and 13 grams of protein  NUTRITION DIAGNOSIS: Inadequate oral intake related to unknown causes (recent foot fracture?) as evidenced by reported intake less than estimated needs.   Goal: Pt to meet >/= 90% of their estimated nutrition needs   Monitor:  Weight trends, po intake, acceptance of supplements, labs  Reason for Assessment: MST  63 y.o. female  Admitting Dx: <principal problem not specified>  ASSESSMENT: 63 y.o. female presenting with dizziness, anuria found to be hypotensive with CR of 5.5 likely 2/2 sepsis. PMH is significant for depression, substance abuse  - Pt reports recent weight loss of 11 lbs in the past 1 1/2 months. She says that she has no idea how she lost weight. She feels as if she eats and drinks well usually. Pt says that she recently suffered from a foot fracture. She reports feels hungry now and ate 100% of her breakfast.  - Feel that pt could benefit from nutritional supplementation while in the hospital to supplement diet with calories and protein so that pt can return to baseline weight. Pt with mild signs of fat and muscle wasting of the face and clavicles.   Height: Ht Readings from Last 1 Encounters:  04/01/14 5\' 6"  (1.676 m)    Weight: Wt Readings from Last 1 Encounters:  04/01/14 139 lb (63.05 kg)    Ideal Body Weight: 59.3 kg  % Ideal Body Weight: 106%  Wt Readings from Last 10 Encounters:  04/01/14 139 lb (63.05 kg)  02/14/14 145 lb (65.772 kg)  07/29/12 150 lb (68.04 kg)    Usual Body Weight: 150 lbs  % Usual Body Weight: 93%  BMI:  Body mass index is 22.45 kg/(m^2).  Estimated Nutritional Needs: Kcal: 1700-1900 Protein: 80-90 g Fluid: 1.7-1.9 L/day  Skin: Intact  Diet Order: Cardiac  EDUCATION NEEDS: -Education needs addressed   Intake/Output Summary  (Last 24 hours) at 04/02/14 1012 Last data filed at 04/02/14 1002  Gross per 24 hour  Intake   3383 ml  Output    875 ml  Net   2508 ml    Last BM: prior to admission   Labs:   Recent Labs Lab 04/01/14 1310 04/01/14 1324 04/02/14 0100 04/02/14 0820  NA 137 136* 139 141  K 3.7 3.4* 3.4* 4.4  CL 95* 95* 107 110  CO2 23  --  18* 17*  BUN 40* 38* 38* 39*  CREATININE 4.95* 5.50* 3.51* 3.07*  CALCIUM 8.9  --  6.8* 7.3*  GLUCOSE 166* 172* 96 113*    CBG (last 3)   Recent Labs  04/01/14 1248 04/01/14 1407  GLUCAP 168* 112*    Scheduled Meds: . heparin  5,000 Units Subcutaneous 3 times per day  . sodium chloride  3 mL Intravenous Q12H    Continuous Infusions: . sodium chloride 200 mL/hr at 04/02/14 [id number]    Past Medical History  Diagnosis Date  . Hypertension   . ETOH abuse   . Substance abuse   . Mental disorder   . Depression     Past Surgical History  Procedure Laterality Date  . Cholecystectomy    . Tubal ligation      Ebbie LatusHaley Hawkins RD, LDN

## 2014-04-02 NOTE — Progress Notes (Signed)
S:Feels better. Still some RUQ pain O:BP 90/49  Pulse 53  Temp(Src) 97.5 F (36.4 C) (Oral)  Resp 22  Ht 5\' 6"  (1.676 m)  Wt 63.05 kg (139 lb)  BMI 22.45 kg/m2  SpO2 99%  LMP 07/27/1997  Intake/Output Summary (Last 24 hours) at 04/02/14 0812 Last data filed at 04/02/14 0433  Gross per 24 hour  Intake   1000 ml  Output    875 ml  Net    125 ml   Weight change:  ZOX:WRUEA and alert CVS:RRR Resp:clear Abd:+ BS ND, mild RUQ tenderness, no guarding or rebound Ext:no edema Back:  Mild Rt CVA tenderness NEURO:CNI Ox3 no asterixis   . heparin  5,000 Units Subcutaneous 3 times per day  . methylPREDNISolone (SOLU-MEDROL) injection  60 mg Intravenous Q6H  . sodium chloride  3 mL Intravenous Q12H   Ct Abdomen Pelvis Wo Contrast  04/01/2014   CLINICAL DATA:  Abdominal pain. Dysuria. Alcohol abuse. Substance abuse.  EXAM: CT ABDOMEN AND PELVIS WITHOUT CONTRAST  TECHNIQUE: Multidetector CT imaging of the abdomen and pelvis was performed following the standard protocol without IV contrast.  COMPARISON:  None.  FINDINGS: No focal hepatic abnormality. Liver contour is slightly irregular suggesting chronic hepatocellular disease and/or cirrhosis. No focal splenic abnormality. Pancreas is unremarkable. Cholecystectomy. No significant biliary distention. Perigastric, peripancreatic, perisplenic, and retroperitoneal serpiginous densities are noted. These may represent varices.  Adrenals are normal. The right kidney is enlarged. Adjacent perirenal fat plain stranding present. Infectious or infiltrating process in the right kidney cannot be excluded. 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 However a renal pelvic stone cannot be excluded. No definite hydronephrosis is identified. Left kidney is unremarkable. Foley catheter is present in the bladder. The bladder is nondistended. Uterus and adnexa are  unremarkable. No significant free pelvic fluid.  Shotty inguinal lymph nodes. Retroperitoneal nodular densities as noted above. Although these may represent lymph nodes nodes, given their serpiginous nature, they most likely represent varices. Aortoiliac atherosclerotic vascular calcification and ectasia noted. Renal artery atherosclerotic vascular disease.  Appendix normal. No bowel obstruction. No free air. A supraumbilical midline abdominal tiny hernia is present. There is herniation of a loop of small bowel. No evidence of bowel obstruction. No inflammatory changes noted in the region of the hernia.  Heart size normal. Basilar atelectasis. 7 mm nodule right lower lobe, image number 11/series 202. No acute bony abnormality. Degenerative changes lumbar spine and both hips. Punctate sclerotic density in the L5 vertebral body, most likely bone island, no other sclerotic lesion noted to suggest blastic metastatic disease.  IMPRESSION: 1. Hepatic contour is irregular. This suggests cirrhosis. Serpiginous nodular densities are noted in the perigastric, peripancreatic, perisplenic, and retroperitoneal regions suggesting varices. 2. Right renal enlargement with right perirenal fat plane stranding . Infectious or infiltrating right renal process exclude pyelonephritis cannot be excluded. Small calcific density noted in the right renal pelvis region. 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 However tiny right renal pelvic stone cannot be completely excluded. No hydronephrosis on the right is present . Foley catheter is in the bladder. The bladder is nondistended. 3. 7 mm pulmonary nodule right lower lobe. If the patient is at high risk for bronchogenic carcinoma, follow-up chest CT at 3-49months is recommended. If the patient is at low risk for bronchogenic carcinoma, follow-up chest CT at 6-12  months is recommended. This recommendation follows the consensus  statement: Guidelines for Management of Small Pulmonary Nodules Detected on CT Scans: A Statement from the Fleischner Society as published in Radiology 2005; 237:395-400.   Electronically Signed   By: Maisie Fushomas  Register   On: 04/01/2014 14:29   BMET    Component Value Date/Time   NA 139 04/02/2014 0100   K 3.4* 04/02/2014 0100   CL 107 04/02/2014 0100   CO2 18* 04/02/2014 0100   GLUCOSE 96 04/02/2014 0100   BUN 38* 04/02/2014 0100   CREATININE 3.51* 04/02/2014 0100   CALCIUM 6.8* 04/02/2014 0100   GFRNONAA 13* 04/02/2014 0100   GFRAA 15* 04/02/2014 0100   CBC    Component Value Date/Time   WBC 17.3* 04/02/2014 0100   RBC 2.86* 04/02/2014 0100   HGB 9.5* 04/02/2014 0100   HCT 26.4* 04/02/2014 0100   PLT 136* 04/02/2014 0100   MCV 92.3 04/02/2014 0100   MCH 33.2 04/02/2014 0100   MCHC 36.0 04/02/2014 0100   RDW 14.1 04/02/2014 0100   LYMPHSABS 1.5 04/01/2014 1310   MONOABS 1.5* 04/01/2014 1310   EOSABS 0.0 04/01/2014 1310   BASOSABS 0.0 04/01/2014 1310     Assessment: 1.  Presumably ARF sec hypotension, ACE, and NSAID's +/- pyelo.  Renal fx improving 2. ?UTI/Pyelo  UC pending.  Received 1 dose of levoquin 3. Hypotension most likely sec vol depletion and infection  Plan: 1. Cont IV fluids 2.  Cont with empiric AB pending cultures (I don't see orders for more AB) 3. Daily Scr    Sylvio Weatherall T

## 2014-04-02 NOTE — Progress Notes (Signed)
Offered assistance with bath, Pt. Stated she wanted to sleep more and will do bath later. Tech provided Pt. With all bath supplies and offered assistance if needed.

## 2014-04-02 NOTE — Progress Notes (Signed)
Utilization Review Completed.  

## 2014-04-02 NOTE — Progress Notes (Signed)
Made MD aware of pts MAP of 49, orders placed for 1L bolus for pt. Liter bolus completed  MAP is now 80. HR 53

## 2014-04-02 NOTE — H&P (Signed)
FMTS Attending Admit Note Patient seen and examined by me on date of admission (04/01/2014 @1700hrs ) and discussed with admitting resident, I agree with Dr Nyra CapesMarsh's assessment and plan for this patient.  Briefly, patient presents with complaint of dizziness and fatigue over several days, as well as chills but no documented fevers which she describes as occurring for over a week.  Initially polyuria and dysuria, changing to oliguria 2 days PTA.  Denies cough or sputum production.  Had voluminous watery diarrhea on morning of admission.  Denies emesis but has had a poor appetite. Has had abdominal pain, mostly across the upper abdomen bilaterally. She does not seek medical attention often.   Patient's hypotension has responded somewhat to NS boluses, of which she received five 1L boluses in ED at time of our initial exam.  Surgical Hx; Open CCY many years ago; no other intra-abdominal surgeries.   Social Hx; She reports prior heavy alcohol use, but cut back one year ago and reports having three shots of vodka per weekend on average.  She denies any other drugs or substances.  She smokes 5 cigarettes per day on average.   On exam she is alert and animated, responding appropriately to questions and in no apparent distress.  Neck supple; PERRL. No cervical adenopathy. Dry mucus membranes.  COR Regular S1S2 PULM Coarse crackles in R lower lung field.  No wheezes appreciated.  ABD generalized tenderness.  Large healed surgical scar. Marked CVA tenderness, worse on the R than L.  EXTS: No ankle edema noted.   Imaging studies and labs reviewed by me.   A/P: Patient with marked leukocytosis, abnormal UA consistent with UTI, flank tenderness and hypotension.  Elevated Cr, no hydronephrosis seen on CT scan, which is also c/w pyelo. To treat for presumed pyelonephritis with IV Levaquin and await urine and blood culture results. May continue NS IV boluses as needed. Admission to SDU.  I discussed the CT scan  findings of RLL lung nodules that were reported on this study, and I counseled patient on the need for further followup of this problem beyond discharge.  She acknowledges understanding of this finding and why it's important to follow up.  We also discussed the importance of smoking cessation and its relationship to many diseases including lung cancer.  Paula ComptonJames Merced Hanners, MD

## 2014-04-02 NOTE — Progress Notes (Signed)
Made Dr Leonides Schanzorsey aware about pts continuous hypotension and bradycardia through the morning. Systolic in the 90s.heart rate 50s.  MD stated to notify if MAP is lower than 65. Peter CongoFoly Cathater D/C'd per MD order. Strict I & O's in place.

## 2014-04-02 NOTE — Progress Notes (Signed)
Pt's bp has been low through the night shift 70s-90s on-call was made aware and bolus was ordered and the infusing rate of  maintanance fluid was increased. Pt is asymtomatic and states she feels ok. Will continue to monitor pt.----Lorrayne Ismael, rn

## 2014-04-02 NOTE — Progress Notes (Signed)
Family Medicine Teaching Service Daily Progress Note Intern Pager: [phone]  Patient name: Dana Mcintosh Medical record number: [id number] Date of birth: 02/05/1951 Age: 63 y.o. Gender: female  Primary Care Provider: Default, Provider, MD Consultants: Nephrology, CCM Code Status: Full  Pt Overview and Major Events to Date:  8/6: Admitted due to dizziness, anuria, and hypotension. IVF boluses, Levaquin, CT concerning for pyelonephritis   Assessment and Plan: Dana Mcintosh is a 63 y.o. female presenting with dizziness and anuria found to be hypotensive with a creatinine of 5.5. PMH is significant for depression, substance abuse   Sepsis, severe:   Patient presented with HR>90, hypotension, a leukocytosis of 29.9, and a U/A positive for nitrites, leukocytes, Hgb, RBCs, bacteria, calcium oxylate crystals, and hyaline casts. CCM was consulted in the ED for consideration of pressors, however improved with fluid boluses. CT was suggestive of pyelonephritis. Unsure if this is simply pyelonephritis of if she had some sort of obstruction (note calcium oxylate crystals on U/A), however no hydronephrosis noted.   No other clear source of infection at this time. Currently hemodynamically stable and improved since presentation.  -continue Levaquin, however consider broadening to cefepime. -low threshold to call CCM for pressors if BP worsens: was 83/43 while I was in the room. -goal MAP >65  -cont with IVF resuscitation  -trend fever curve and WBCs with daily CBC   Acute kidney injury: with associated anuria- Last Creatine in our EMR was 0.99 in 12/13. Now 5.5. DDx: hypovolemia vs sepsis vs nephrotoxins. No evidence of obstruction as noted above.  Per reports, patient has been taking  NSAIDs chronically for regular body aches. Also previously on an ACE inhibitor. Renal consulted in the ED.  Appears to be improving, Cre now 3.51.   -IVF @ 200 -hold nephrotoxic meds  -daily renal fxn panels  -renal  following, appreciate recs - Will remove foley today and have voiding trial. Bladder scan and in and out cath PRN. - If patient continues have have urinary retention, will consider bladder ultrasound. - no need for RRT at this time.   -strict i/os: 875cc out, net +125cc O/N  Hyperglycemia CBG 168 in the ED. Pt also endorsed a history of polydipsia concerning for diabetes. However, this could also be related to acute stressors. HbA1c in 07/2012 was 5.2.  -HbA1C in process  - CBGs: 168-112  Headache: Most likely secondary to dehydration and infection. No focal neuro deficits on exam; potentially at risk for intracranial pathology given smoking hx but low suspicion at this time  -improved  -not good candidate for tylenol    Hypertension- On thiazide and ACE at home: notes her medicines were prescribed by [facility]. Currently hypotensive.  -holding home regimen -will trend vitals   -would avoid sending pt out on these meds given renal fxn    Cirrhosis: Noted incidentally on CT scan. AST 42/ALT 32; albumin 2.5  Could be secondary to alcohol abuse but given UDS positive for cocaine there is concern for high risk activities.  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  -viral hepatitis panel pending - will consider GI consult for viral hepatitis and cirrhosis once new viral hepatitis panel returns  -HIV pending -would recommend close f/up with US and potential biopsy  - ETOH level <11  Substance abuse- reporting 3 +shots of vodka in the ED, but suspect more; also potentially has a psychiatric history which certainly  makes addiction more difficult  -will start on CIWA: 4-0 -consult SW   Tobacco abuse  -nicotine patch prn  -counseling per nursing   Pulmonary nodule- Noted incidentally on CT-abdomen, however with a strong smoking history, this is highly concerning for malignancy  -repeat CT in 3-6 months   FEN/GI: reg diet, NS  @ 200cc/hr Prophylaxis: hep sq   Disposition: SDU pending improvement in BPs  Subjective:  Patient very agitated when I ask about her BP medications. Tells me [facility] [address]. Has some back pain that has improved. Dizziness has improved..  Objective: Temp:  [87.9 F (31.1 C)-98.9 F (37.2 C)] 87.9 F (31.1 C) (08/07 0400) Pulse Rate:  [61-95] 61 (08/07 0400) Resp:  [12-28] 18 (08/07 0400) BP: (69-140)/(44-90) 89/55 mmHg (08/07 0400) SpO2:  [93 %-100 %] 99 % (08/07 0400) Weight:  [139 lb (63.05 kg)] 139 lb (63.05 kg) (08/06 1240) Physical Exam: General: thin elderly woman, lying in bed sleeping HEENT: NCAT, Oropharynx clear. MMM Cardiovascular: RRR, no m/r/g  Respiratory: nml WOB, crackles noted on right lung base but otherwise CTAB  Abdomen: large midline scars, well healed, +BS, soft, non-distended, minimally tender diffusely. Extremities: No gross deformities  Skin: no evidence of breakdown  Neuro: Speech clear. No gross focal deficits appreciated   Laboratory:  Recent Labs Lab 04/01/14 1310 04/01/14 1324 04/02/14 0100  WBC 29.9*  --  17.3*  HGB 12.1 13.3 9.5*  HCT 33.8* 39.0 26.4*  PLT 165  --  136*    Recent Labs Lab 04/01/14 1310 04/01/14 1324 04/02/14 0100  NA 137 136* 139  K 3.7 3.4* 3.4*  CL 95* 95* 107  CO2 23  --  18*  BUN 40* 38* 38*  CREATININE 4.95* 5.50* 3.51*  CALCIUM 8.9  --  6.8*  PROT 7.9  --   --   BILITOT 1.0  --   --   ALKPHOS 79  --   --   ALT 32  --   --   AST 42*  --   --   GLUCOSE 166* 172* 96  U/A: moderate bilirubin, positive nitrite, large leukocytes, large Hgb, numerous wBCs, 21-50 RBCs, many bacteria, calcium oxylate crystals, hyaline casts UDS: Positive for cocaine and THC Urine cx 8/6: pending Blood cx 8/6: pending  ETOH level <11  Imaging/Diagnostic Tests: CT abdomen 8/6: Hepatic contour is irregular. This suggests cirrhosis. Serpiginous nodular densities are noted in the perigastric,  peripancreatic, perisplenic, and retroperitoneal regions suggesting varices. Right renal enlargement with right perirenal fat plane stranding . Infectious or infiltrating right renal process exclude pyelonephritis cannot be excluded. Small calcific density noted in the right renal pelvis region. 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 However tiny right renal pelvic stone cannot be completely excluded. No hydronephrosis on the right is present . Foley catheter is in the bladder. The bladder is nondistended. 7 mm pulmonary nodule right lower lobe. If the patient is at high risk for bronchogenic carcinoma   Joanna Puff, MD 04/02/2014, 6:55 AM PGY-1, [facility] Family Medicine FPTS Intern pager: [phone], text pages welcome

## 2014-04-03 DIAGNOSIS — I498 Other specified cardiac arrhythmias: Secondary | ICD-10-CM

## 2014-04-03 LAB — CBC
HCT: 29.2 % — ABNORMAL LOW (ref 36.0–46.0)
HEMOGLOBIN: 10.4 g/dL — AB (ref 12.0–15.0)
MCH: 32.8 pg (ref 26.0–34.0)
MCHC: 35.6 g/dL (ref 30.0–36.0)
MCV: 92.1 fL (ref 78.0–100.0)
Platelets: 158 10*3/uL (ref 150–400)
RBC: 3.17 MIL/uL — ABNORMAL LOW (ref 3.87–5.11)
RDW: 14.4 % (ref 11.5–15.5)
WBC: 11.8 10*3/uL — AB (ref 4.0–10.5)

## 2014-04-03 LAB — URINE CULTURE: Colony Count: >=100000

## 2014-04-03 LAB — BASIC METABOLIC PANEL
Anion gap: 13 (ref 5–15)
BUN: 45 mg/dL — ABNORMAL HIGH (ref 6–23)
CHLORIDE: 112 meq/L (ref 96–112)
CO2: 16 meq/L — AB (ref 19–32)
CREATININE: 1.99 mg/dL — AB (ref 0.50–1.10)
Calcium: 7.4 mg/dL — ABNORMAL LOW (ref 8.4–10.5)
GFR calc Af Amer: 30 mL/min — ABNORMAL LOW (ref >90)
GFR calc non Af Amer: 26 mL/min — ABNORMAL LOW (ref >90)
Glucose, Bld: 126 mg/dL — ABNORMAL HIGH (ref 70–99)
Potassium: 4 mEq/L (ref 3.7–5.3)
Sodium: 141 mEq/L (ref 137–147)

## 2014-04-03 MED ORDER — PANTOPRAZOLE SODIUM 40 MG PO TBEC
40.0000 mg | DELAYED_RELEASE_TABLET | Freq: Every day | ORAL | Status: DC
  Administered 2014-04-03 – 2014-04-04 (×2): 40 mg via ORAL
  Filled 2014-04-03 (×2): qty 1

## 2014-04-03 MED ORDER — DEXTROSE 5 % IV SOLN
1.0000 g | INTRAVENOUS | Status: DC
  Administered 2014-04-03: 1 g via INTRAVENOUS
  Filled 2014-04-03 (×2): qty 10

## 2014-04-03 MED ORDER — LEVOFLOXACIN IN D5W 750 MG/150ML IV SOLN: 750.0000 mg | INTRAVENOUS | Status: DC

## 2014-04-03 NOTE — Progress Notes (Signed)
Pt requesting benedryl for sleep, MD notified, vs obtained, md states to hold off because hr 38-40, pt states: I knew it would be something else just to keep me up all night, I want you to get out of here", went back in to offer pt hs meds she states" it will only keep me up even more, I don't want it". Medications charted as not given.

## 2014-04-03 NOTE — Progress Notes (Signed)
Pt' s heart rate has been in the 40s most of the night, pt has been asymptomatic, on call Elray McgregorMary Lynch was made aware who said since pt is asymptomatic we will just keep monitoring her.------Kristan Brummitt, rn

## 2014-04-03 NOTE — Progress Notes (Signed)
FMTS ATTENDING  NOTE Dana Eyerly,MD I  have seen and examined this patient, reviewed their chart. I have discussed this patient with the resident. I agree with the resident's findings, assessment and care plan. Patient improved clinically, denies dizziness or fatigue. BP now holding up, kidney function improved after IVF hydration. She is currently on Levaquin for UTI, urine culture grew Ecoli, sensitivity pending, may switch to ceftriaxone. I discussed her CT  Result which showed pulmonary nodule, I recommended repeat testing with her PCP upon discharge from the hospital, she verbalized understanding.

## 2014-04-03 NOTE — Progress Notes (Addendum)
ANTIBIOTIC CONSULT NOTE - INITIAL  Pharmacy Consult for Ceftriaxone Indication: E.coli UTI/bacteremia  Allergies  Allergen Reactions  . Penicillins Hives    Patient Measurements: Height: 5\' 6"  (167.6 cm) Weight: 139 lb (63.05 kg) IBW/kg (Calculated) : 59.3  Vital Signs: Temp: 98.4 F (36.9 C) (08/08 1200) Temp src: Oral (08/08 1200) BP: 99/68 mmHg (08/08 1100) Pulse Rate: 35 (08/08 1200) Intake/Output from previous day: 08/07 0701 - 08/08 0700 In: 2950 [P.O.:750; I.V.:2200] Out: 1475 [Urine:1475] Intake/Output from this shift: Total I/O In: 480 [P.O.:480] Out: 900 [Urine:900]  Labs:  Recent Labs  04/02/14 0100 04/02/14 0820 04/03/14 0343  WBC 17.3* 15.2* 11.8*  HGB 9.5* 11.0* 10.4*  PLT 136* 161 158  CREATININE 3.51* 3.07* 1.99*   Estimated Creatinine Clearance: 28.1 ml/min (by C-G formula based on Cr of 1.99). No results found for this basename: VANCOTROUGH, Leodis BinetVANCOPEAK, VANCORANDOM, GENTTROUGH, GENTPEAK, GENTRANDOM, TOBRATROUGH, TOBRAPEAK, TOBRARND, AMIKACINPEAK, AMIKACINTROU, AMIKACIN,  in the last 72 hours   Microbiology: Recent Results (from the past 720 hour(s))  URINE CULTURE     Status: None   Collection Time    04/01/14  2:30 PM      Result Value Ref Range Status   Specimen Description URINE, RANDOM   Final   Special Requests NONE   Final   Culture  Setup Time     Final   Value: 04/01/2014 15:35     Performed at [facility]   Colony Count     Final   Value: >=100,000 COLONIES/ML     [facility]formed at [facility]   Culture     Final   Value: ESCHERICHIA COLI     Performed at [facility]   Report Status 04/03/2014 FINAL   Final   Organism ID, Bacteria ESCHERICHIA COLI   Final  CULTURE, BLOOD (ROUTINE X 2)     Status: None   Collection Time    04/01/14  4:57 PM      Result Value Ref Range Status   Specimen Description BLOOD LEFT ANTECUBITAL   Final   Special Requests BOTTLES DRAWN AEROBIC AND ANAEROBIC 6CCS   Final   Culture  Setup Time     Final   Value: 04/01/2014 21:39     Performed at [facility]   Culture     Final   Value: ESCHERICHIA COLI     Note: Gram Stain Report Called to,Read Back By and Verified With: MIKALA INGRAM 04/02/14 1040 BY SMITHERSJ     Performed at [facility]   Report Status PENDING   Incomplete  CULTURE, BLOOD (ROUTINE X 2)     Status: None   Collection Time    04/01/14  6:04 PM      Result Value Ref Range Status   Specimen Description BLOOD LEFT HAND   Final   Special Requests BOTTLES DRAWN AEROBIC ONLY [facility]   Final   Culture  Setup Time     Final   Value: 04/01/2014 21:38     Performed at [facility]   Culture     Final   Value:        BLOOD CULTURE RECEIVED NO GROWTH TO DATE CULTURE WILL BE HELD FOR 5 DAYS BEFORE ISSUING A FINAL NEGATIVE REPORT     Performed at [facility]   Report Status PENDING   Incomplete  MRSA PCR SCREENING     Status: None   Collection Time    04/01/14 10:05 PM  Result Value Ref Range Status   MRSA by PCR NEGATIVE  NEGATIVE Final   Comment:            The GeneXpert MRSA Assay (FDA     approved for NASAL specimens     only), is one component of a     comprehensive MRSA colonization     surveillance program. It is not     intended to diagnose MRSA     infection nor to guide or     monitor treatment for     MRSA infections.    Medical History: Past Medical History  Diagnosis Date  . Hypertension   . ETOH abuse   . Substance abuse   . Mental disorder   . Depression     Medications:  Scheduled:  . feeding supplement (ENSURE COMPLETE)  237 mL Oral BID BM  . heparin  5,000 Units Subcutaneous 3 times per day  . methylPREDNISolone (SOLU-MEDROL) injection  60 mg Intravenous Q12H  . pantoprazole  40 mg Oral Daily  . sodium chloride  3 mL Intravenous Q12H   Infusions:  . sodium chloride 1,000 mL (04/03/14 0759)   Assessment: 63 yo F presenting on 8/6 with dizziness, anuria and hypotension  initially started on Levaquin for possible pyelonephritis. Now to switch to CTX per pharmacy. Patient remains afebrile and WBC improving (17.3>>11.8). Renal function is also improving. Noted allergy to penicillin of hives, low risk for cross sensitivity and documented receipt of CTX in the past but should still monitor closely for any s/s of allergic reaction.   8/6 UCx>>Ecoli pansensitive except ampicillin 8/6 BCx2>> 1/2 Ecoli  Goal of Therapy:  Resolution of infection  Plan:  - Ceftriaxone 1 gm IV q24h  - Pharmacy will sign off as no renal dosage adjustments required   Margie Billet, PharmD Clinical Pharmacist - Resident Pager: [phone] Pharmacy: [phone] 04/03/2014 1:56 PM

## 2014-04-03 NOTE — Progress Notes (Signed)
S: Feels well.  Eating well.  No new CO O:BP 129/82  Pulse 56  Temp(Src) 97.3 F (36.3 C) (Oral)  Resp 20  Ht 5\' 6"  (1.676 m)  Wt 63.05 kg (139 lb)  BMI 22.45 kg/m2  SpO2 97%  LMP 07/27/1997  Intake/Output Summary (Last 24 hours) at 04/03/14 0752 Last data filed at 04/03/14 0600  Gross per 24 hour  Intake   2950 ml  Output   1475 ml  Net   1475 ml   Weight change:  NWG:NFAOZ and alert CVS: bradycardic, reg Resp:clear Abd:+ BS NDNT Ext:no edema Back:  Mild Rt CVA tenderness NEURO:CNI Ox3 no asterixis   . feeding supplement (ENSURE COMPLETE)  237 mL Oral BID BM  . heparin  5,000 Units Subcutaneous 3 times per day  . levofloxacin (LEVAQUIN) IV  500 mg Intravenous Q48H  . methylPREDNISolone (SOLU-MEDROL) injection  60 mg Intravenous Q12H  . sodium chloride  3 mL Intravenous Q12H   Ct Abdomen Pelvis Wo Contrast  04/01/2014   CLINICAL DATA:  Abdominal pain. Dysuria. Alcohol abuse. Substance abuse.  EXAM: CT ABDOMEN AND PELVIS WITHOUT CONTRAST  TECHNIQUE: Multidetector CT imaging of the abdomen and pelvis was performed following the standard protocol without IV contrast.  COMPARISON:  None.  FINDINGS: No focal hepatic abnormality. Liver contour is slightly irregular suggesting chronic hepatocellular disease and/or cirrhosis. No focal splenic abnormality. Pancreas is unremarkable. Cholecystectomy. No significant biliary distention. Perigastric, peripancreatic, perisplenic, and retroperitoneal serpiginous densities are noted. These may represent varices.  Adrenals are normal. The right kidney is enlarged. Adjacent perirenal fat plain stranding present. Infectious or infiltrating process in the right kidney cannot be excluded. 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 However a renal pelvic stone cannot be excluded. No definite hydronephrosis is identified. Left kidney is unremarkable.  Foley catheter is present in the bladder. The bladder is nondistended. Uterus and adnexa are unremarkable. No significant free pelvic fluid.  Shotty inguinal lymph nodes. Retroperitoneal nodular densities as noted above. Although these may represent lymph nodes nodes, given their serpiginous nature, they most likely represent varices. Aortoiliac atherosclerotic vascular calcification and ectasia noted. Renal artery atherosclerotic vascular disease.  Appendix normal. No bowel obstruction. No free air. A supraumbilical midline abdominal tiny hernia is present. There is herniation of a loop of small bowel. No evidence of bowel obstruction. No inflammatory changes noted in the region of the hernia.  Heart size normal. Basilar atelectasis. 7 mm nodule right lower lobe, image number 11/series [id number]. No acute bony abnormality. Degenerative changes lumbar spine and both hips. Punctate sclerotic density in the L5 vertebral body, most likely bone island, no other sclerotic lesion noted to suggest blastic metastatic disease.  IMPRESSION: 1. Hepatic contour is irregular. This suggests cirrhosis. Serpiginous nodular densities are noted in the perigastric, peripancreatic, perisplenic, and retroperitoneal regions suggesting varices. 2. Right renal enlargement with right perirenal fat plane stranding . Infectious or infiltrating right renal process exclude pyelonephritis cannot be excluded. Small calcific density noted in the right renal pelvis region. 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 However tiny right renal pelvic stone cannot be completely excluded. No hydronephrosis on the right is present . Foley catheter is in the bladder. The bladder is nondistended. 3. 7 mm pulmonary nodule right lower lobe. If the patient is at high risk for bronchogenic carcinoma, follow-up chest CT  at 3-356months is recommended. If the patient is at low risk for bronchogenic carcinoma,  follow-up chest CT at 6-12 months is recommended. This recommendation follows the consensus statement: Guidelines for Management of Small Pulmonary Nodules Detected on CT Scans: A Statement from the Fleischner Society as published in Radiology 2005; 237:395-400.   Electronically Signed   By: Maisie Fushomas  Register   On: 04/01/2014 14:29   BMET    Component Value Date/Time   NA 141 04/03/2014 0343   K 4.0 04/03/2014 0343   CL 112 04/03/2014 0343   CO2 16* 04/03/2014 0343   GLUCOSE 126* 04/03/2014 0343   BUN 45* 04/03/2014 0343   CREATININE 1.99* 04/03/2014 0343   CALCIUM 7.4* 04/03/2014 0343   GFRNONAA 26* 04/03/2014 0343   GFRAA 30* 04/03/2014 0343   CBC    Component Value Date/Time   WBC 11.8* 04/03/2014 0343   RBC 3.17* 04/03/2014 0343   HGB 10.4* 04/03/2014 0343   HCT 29.2* 04/03/2014 0343   PLT 158 04/03/2014 0343   MCV 92.1 04/03/2014 0343   MCH 32.8 04/03/2014 0343   MCHC 35.6 04/03/2014 0343   RDW 14.4 04/03/2014 0343   LYMPHSABS 1.5 04/01/2014 1310   MONOABS 1.5* 04/01/2014 1310   EOSABS 0.0 04/01/2014 1310   BASOSABS 0.0 04/01/2014 1310     Assessment: 1.  Presumably ARF sec hypotension, ACE, and NSAID's +/- pyelo.  Renal fx cont to improve 2. E Coli urosepsis (betting that GNR in blood is E coli) 3. Hypotension most likely sec vol depletion and infection  Plan: 1. Decrease IV fluids to 75cc/hr for another 24hr, then could DC 2. Cont AB 3 Await Sens of E coli 4. Recheck renal fx in AM    Lauraann Missey T

## 2014-04-03 NOTE — Progress Notes (Signed)
Family Medicine Teaching Service Daily Progress Note Intern Pager: [phone]  Patient name: Dana Mcintosh Medical record number: [id number] Date of birth: 09/17/1950 Age: 63 y.o. Gender: female  Primary Care Provider: Default, Provider, MD Consultants: Nephrology, CCM Code Status: Full  Pt Overview and Major Events to Date:  8/6: Admitted due to dizziness, anuria, and hypotension. IVF boluses, Levaquin, CT concerning for pyelonephritis   Assessment and Plan: Dana Mcintosh is a 63 y.o. female presenting with dizziness and anuria found to be hypotensive with a creatinine of 5.5. PMH is significant for depression, substance abuse   Sepsis, severe:   Patient presented with HR>90, hypotension, a leukocytosis of 29.9, and a U/A positive for nitrites, leukocytes, Hgb, RBCs, bacteria, calcium oxylate crystals, and hyaline casts. CCM was consulted in the ED for consideration of pressors, however pt improved with fluid boluses. CT was suggestive of pyelonephritis with no signs of obstruction (no hydronephrosis noted). Blood culture growing gram negative rods in 1 bottle. Urine cx >100,000 colonies of Ecoli. -continue Levaquin, will await sensitivities prior to changing.  -low threshold to call CCM for pressors if BP worsens -cont with IVF resuscitation @ 200cc/hr -trend fever curve and WBCs with daily CBC: WBC improving 15.2>11.8  Acute kidney injury: with associated anuria- Last Creatine in our EMR was 0.99 in 12/13. Now 5.5. DDx: hypovolemia vs sepsis vs nephrotoxins. No evidence of obstruction as noted above.  Per reports, patient has been taking  NSAIDs chronically for regular body aches. Also previously on an ACE inhibitor. Renal consulted in the ED.     -IVF @ 200 -hold nephrotoxic meds  -daily renal fxn panels: Appears to be improving, Cre now 3.51>1.99 -renal following, appreciate recs - No issues with voiding without foley.  - no need for RRT at this time.   -strict i/os: 1.4L out, +1.4L  net O/N  Hypertension- On thiazide and ACE at home: notes her medicines were prescribed by [facility]. Currently hypotensive.  -holding home regimen -will trend vitals - Advised RN to call for MAPs<65. O/N MAP of 49, improved to 80 with 1L bolus.   - Cortisol appropriately elevated to 39.0 -TSH normal at 1.58 -would avoid discharging pt with home regimen given renal fxn, however would also avoid beta blockers due to cocaine abuse (UDS +)  Hyperglycemia CBG 168 in the ED. Pt also endorsed a history of polydipsia concerning for diabetes. However, this could also be related to acute stressors. HbA1c in 07/2012 was 5.2.  -HbA1C normal at 5.3 - CBGs: 100s  Cirrhosis: Noted incidentally on CT scan. AST 42/ALT 32; albumin 2.5  Could be secondary to alcohol abuse but given UDS positive for cocaine there is concern for high risk activities.  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 Currently, patient positive for HCV Ab. Neg for hep A, B and HIV  -HCV quantitative RNA pending. Spoke to [employer] with ID who states if HCV RNA positive she can follow up as an outpt. - Consider GI consult inpt vs outpt. -HIV negative -would recommend close f/up with [place] and potential biopsy  - ETOH level <11  GERD: Patient on Nexium at home for acid reflux which was not started on admission. Endorses epigastric pain currently. - Will start Protonix today.  Headache: Most likely secondary to dehydration and infection. No focal neuro deficits on exam; potentially at risk for intracranial pathology given smoking hx but low  suspicion at this time  -improved  -not good candidate for tylenol    Substance abuse- reporting 3 +shots of vodka in the ED, but suspect more; also potentially has a psychiatric history which certainly makes addiction more difficult  -will start on CIWA: 0-8: Anxiety and agitation  -re-consult SW: was done on admission and was "completed"  per EMR, however I am not seeing any note.  Tobacco abuse  -nicotine patch prn  -counseling per nursing   Pulmonary nodule- Noted incidentally on CT-abdomen, however with a strong smoking history, this is highly concerning for malignancy  -repeat CT in 3-6 months   FEN/GI: reg diet, NS @ 200cc/hr Prophylaxis: hep sq   Disposition: SDU pending improvement in BPs  Subjective:  Patient no complaints. Urinating well without dysuria. No abdominal pain. No fevers/chills. Eating well.    Objective: Temp:  [96.7 F (35.9 C)-98 F (36.7 C)] 97.3 F (36.3 C) (08/08 0351) Pulse Rate:  [50-78] 56 (08/08 0351) Resp:  [16-24] 20 (08/08 0351) BP: (81-129)/(41-82) 129/82 mmHg (08/08 0351) SpO2:  [95 %-100 %] 97 % (08/08 0351) Physical Exam: General: Lying in bed sleeping HEENT: NCAT, Oropharynx clear. MMM Cardiovascular: RRR, no m/r/g  Respiratory: nml WOB, crackles noted on right lung base but otherwise CTAB  Abdomen: large midline scars, well healed, +BS, soft, non-distended, tender in the epigastric region. Extremities: No gross deformities  Skin: no evidence of breakdown  Neuro: Speech clear. No gross focal deficits appreciated   Laboratory:  Recent Labs Lab 04/02/14 0100 04/02/14 0820 04/03/14 0343  WBC 17.3* 15.2* 11.8*  HGB 9.5* 11.0* 10.4*  HCT 26.4* 30.9* 29.2*  PLT 136* 161 158    Recent Labs Lab 04/01/14 1310  04/02/14 0100 04/02/14 0820 04/03/14 0343  NA 137  < > 139 141 141  K 3.7  < > 3.4* 4.4 4.0  CL 95*  < > 107 110 112  CO2 23  --  18* 17* 16*  BUN 40*  < > 38* 39* 45*  CREATININE 4.95*  < > 3.51* 3.07* 1.99*  CALCIUM 8.9  --  6.8* 7.3* 7.4*  PROT 7.9  --   --  6.3  --   BILITOT 1.0  --   --  0.7  --   ALKPHOS 79  --   --  75  --   ALT 32  --   --  26  --   AST 42*  --   --  49*  --   GLUCOSE 166*  < > 96 113* 126*  < > = values in this interval not displayed.  UDS: Positive for cocaine and THC ETOH level <11 TSH: 1.58  Micro: Hep A IgM:  NR Hep B C IgM: Neg HCV Ab: reactive HIV: NR U/A: moderate bilirubin, positive nitrite, large leukocytes, large Hgb, numerous wBCs, 21-50 RBCs, many bacteria, calcium oxylate crystals, hyaline casts Urine cx 8/6: >100,000 colonies E.col Blood cx 8/6: Gram neg rods in 1 bottle   Imaging/Diagnostic Tests: CT abdomen 8/6: Hepatic contour is irregular. This suggests cirrhosis. Serpiginous nodular densities are noted in the perigastric, peripancreatic, perisplenic, and retroperitoneal regions suggesting varices. Right renal enlargement with right perirenal fat plane stranding . Infectious or infiltrating right renal process exclude pyelonephritis cannot be excluded. Small calcific density noted in the right renal pelvis region. 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 However tiny right renal pelvic stone cannot be completely excluded. No hydronephrosis  on the right is present . Foley catheter is in the bladder. The bladder is nondistended. 7 mm pulmonary nodule right lower lobe. If the patient is at high risk for bronchogenic carcinoma   Joanna Puff, MD 04/03/2014, 6:51 AM PGY-1, [facility] Family Medicine FPTS Intern pager: [phone], text pages welcome

## 2014-04-03 NOTE — Progress Notes (Signed)
MD notified of pt's HR decreasing to 37-38 sustaining. EKG ordered. Pt's RN notified.

## 2014-04-04 DIAGNOSIS — F1010 Alcohol abuse, uncomplicated: Secondary | ICD-10-CM

## 2014-04-04 DIAGNOSIS — R51 Headache: Secondary | ICD-10-CM

## 2014-04-04 LAB — BASIC METABOLIC PANEL
Anion gap: 14 (ref 5–15)
BUN: 46 mg/dL — AB (ref 6–23)
CO2: 15 meq/L — AB (ref 19–32)
CREATININE: 1.62 mg/dL — AB (ref 0.50–1.10)
Calcium: 8 mg/dL — ABNORMAL LOW (ref 8.4–10.5)
Chloride: 111 mEq/L (ref 96–112)
GFR calc Af Amer: 39 mL/min — ABNORMAL LOW (ref >90)
GFR calc non Af Amer: 33 mL/min — ABNORMAL LOW (ref >90)
GLUCOSE: 117 mg/dL — AB (ref 70–99)
POTASSIUM: 3.6 meq/L — AB (ref 3.7–5.3)
Sodium: 140 mEq/L (ref 137–147)

## 2014-04-04 LAB — CBC
HCT: 31.2 % — ABNORMAL LOW (ref 36.0–46.0)
HEMOGLOBIN: 11.1 g/dL — AB (ref 12.0–15.0)
MCH: 33 pg (ref 26.0–34.0)
MCHC: 35.6 g/dL (ref 30.0–36.0)
MCV: 92.9 fL (ref 78.0–100.0)
Platelets: 164 10*3/uL (ref 150–400)
RBC: 3.36 MIL/uL — ABNORMAL LOW (ref 3.87–5.11)
RDW: 14.6 % (ref 11.5–15.5)
WBC: 10.1 10*3/uL (ref 4.0–10.5)

## 2014-04-04 LAB — CULTURE, BLOOD (ROUTINE X 2)

## 2014-04-04 MED ORDER — CIPROFLOXACIN HCL 500 MG PO TABS: 500.0000 mg | ORAL_TABLET | Freq: Every day | ORAL | Status: DC

## 2014-04-04 MED ORDER — POTASSIUM CHLORIDE CRYS ER 20 MEQ PO TBCR
30.0000 meq | EXTENDED_RELEASE_TABLET | Freq: Once | ORAL | Status: DC
  Filled 2014-04-04: qty 1

## 2014-04-04 MED ORDER — SODIUM BICARBONATE 650 MG PO TABS
650.0000 mg | ORAL_TABLET | Freq: Three times a day (TID) | ORAL | Status: DC
  Administered 2014-04-04: 650 mg via ORAL
  Filled 2014-04-04 (×3): qty 1

## 2014-04-04 NOTE — Progress Notes (Addendum)
FMTS ATTENDING  NOTE Sherin Murdoch,MD I  have seen and examined this patient, reviewed their chart. I have discussed this patient with the resident. I agree with the resident's findings, assessment and care plan. In addition patient's HR has been running low, she is completely asymptomatic, she has had low HR in the past due to rheumatic heart disease. She has never been evaluated by the cardiologist but she stated she has an up coming appointment in Sept. I discussed with resident and recommended curbside consult with cardiologist today. We will monitor pending cardiology recommendation, discharge not recommended for now.

## 2014-04-04 NOTE — Progress Notes (Signed)
Pt expressing desire to leave AMA several times this AM.  Spoke with pt regarding her concerns, need to leave hospital with some redirection earlier in the morning.  Pt called at 1130 stating that she was "ready to get out of here."  MD notified of pts request.  Will continue to monitor. Merissa Renwick, [facility]

## 2014-04-04 NOTE — Progress Notes (Signed)
Received a page from the nurse that patient is wanting to leave AMA. Unable to convince patient to stay.   Patient was given reasons as to why we would like for her to stay at least one more night. Transitioning IV ABX to PO as well as her HR being in the 40-50 range. Discussed case with Cardiology. They advised that no treatment would be needed since patient is asymptomatic. She is currently still asymptomatic upon last evaluation. Discussed with pharmacy about best PO. Decided on Cipro 500 mg for a treatment total of 14 days.   Myra RudeJeremy E Aadvik Roker, MD PGY-2, [facility] 04/04/2014, 12:53 PM

## 2014-04-04 NOTE — Progress Notes (Signed)
Spoke with pt and she confirmed her desire to leave AMA.  PIV removed and pt was instructed to return to ED if symptoms returned or worsened.  Pt expressed understanding and ambulated off unit independently.  Dana Mcintosh, [facility]

## 2014-04-04 NOTE — Progress Notes (Signed)
S: Feels good  No CO O:BP 127/68  Pulse 43  Temp(Src) 97.8 F (36.6 C) (Oral)  Resp 18  Ht 5\' 6"  (1.676 m)  Wt 63.05 kg (139 lb)  BMI 22.45 kg/m2  SpO2 100%  LMP 07/27/1997  Intake/Output Summary (Last 24 hours) at 04/04/14 0803 Last data filed at 04/03/14 1200  Gross per 24 hour  Intake    480 ml  Output    900 ml  Net   -420 ml   Weight change:  ZOX:WRUEAGen:awake and alert CVS: bradycardic, reg Resp:clear Abd:+ BS NDNT Ext:no edema Back:  Rt CVA tenderness gone NEURO:CNI Ox3 no asterixis   . cefTRIAXone (ROCEPHIN)  IV  1 g Intravenous Q24H  . feeding supplement (ENSURE COMPLETE)  237 mL Oral BID BM  . heparin  5,000 Units Subcutaneous 3 times per day  . methylPREDNISolone (SOLU-MEDROL) injection  60 mg Intravenous Q12H  . pantoprazole  40 mg Oral Daily  . sodium chloride  3 mL Intravenous Q12H   No results found. BMET    Component Value Date/Time   NA 140 04/04/2014 0404   K 3.6* 04/04/2014 0404   CL 111 04/04/2014 0404   CO2 15* 04/04/2014 0404   GLUCOSE 117* 04/04/2014 0404   BUN 46* 04/04/2014 0404   CREATININE 1.62* 04/04/2014 0404   CALCIUM 8.0* 04/04/2014 0404   GFRNONAA 33* 04/04/2014 0404   GFRAA 39* 04/04/2014 0404   CBC    Component Value Date/Time   WBC 10.1 04/04/2014 0404   RBC 3.36* 04/04/2014 0404   HGB 11.1* 04/04/2014 0404   HCT 31.2* 04/04/2014 0404   PLT 164 04/04/2014 0404   MCV 92.9 04/04/2014 0404   MCH 33.0 04/04/2014 0404   MCHC 35.6 04/04/2014 0404   RDW 14.6 04/04/2014 0404   LYMPHSABS 1.5 04/01/2014 1310   MONOABS 1.5* 04/01/2014 1310   EOSABS 0.0 04/01/2014 1310   BASOSABS 0.0 04/01/2014 1310     Assessment: 1.  Presumably ARF sec hypotension, ACE, and NSAID's +/- pyelo.  Renal fx cont to improve 2. E Coli urosepsis 3. Hypotension most likely sec vol depletion and infection 4. Metabolic acidosis  Plan: 1. DC IV fluids 2. PO bicarb 3. Renal fx should cont to improve 4. Will sign off, call if further renal issues.  I would tx E Coli for at least 2  weeks    Levin Dagostino T

## 2014-04-04 NOTE — Progress Notes (Signed)
Family Medicine Teaching Service Daily Progress Note Intern Pager: [phone]  Patient name: Talbot GrumblingCarolyn R Kamp Medical record number: [id number] Date of birth: 07/12/1951 Age: 63 y.o. Gender: female  Primary Care Provider: Default, Provider, MD Consultants: Nephrology, CCM Code Status: Full  Pt Overview and Major Events to Date:  8/6: Admitted due to dizziness, anuria, and hypotension. IVF boluses, Levaquin, CT concerning for pyelonephritis  8/8: CTX started: Urine cx (e. Coli); blood cx x 1 (E. Coli)  8/9:stopped fluids. Started bicarb, renal signed off   ABX/Culture Levaquin 8/8>8/8 CTX 8/8>>  Blood cx 8/6: E.coli, awaiting sensitivities  Blood cx 8/6: NGTD  Urine cx: 8/6: >100,000 colonies E. Coli   Assessment and Plan: Talbot GrumblingCarolyn R Sorbo is a 63 y.o. female presenting with dizziness and0 anuria found to be hypotensive with a creatinine of 5.5. PMH is significant for depression, substance abuse   #E. Coli urosepsis:   Urine cx >100,000 colonies of Ecoli and one blood cx (+) for E. Coli  -CTX started - transitioned to cipro PO today 500 mg daily  - discussed with pharmacy  - stopped IV fluids   #Non anion Gap metabolic acidosis: noted by renal function  - PO bicarb   #Acute kidney injury:  Improving. No issues with voiding without foley.  - Renal recs: started Bicarb, stopped fluids; signed off       -hold nephrotoxic meds   -strict i/os  #Hx of Hypertension-  Currently hypotensive.  -holding home regimen - Advised RN to call for MAPs<65.    - Cortisol appropriately elevated to 39.0 -TSH normal at 1.58 -would avoid discharging pt with home regimen given renal fxn, however would also avoid beta blockers due to cocaine abuse (UDS +)  #Hyperglycemia HbA1c in 07/2012 was 5.2.  -HbA1C normal at 5.3 - CBGs: 100s  #Cirrhosis: Noted incidentally on CT scan.  Could be secondary to alcohol abuse but given UDS positive for cocaine there is concern for high risk activities.  Patient  positive for hep A AB, Hep B core total Ab and E Ab, and HCV AB in 2009.  - positive for HCV Ab.  - Neg for hep A, B and HIV  - HCV quantitative RNA pending.   - Spoke to [employer] with ID who states if HCV RNA positive she can follow up as an outpt. - positive for HCV Ab.  - Neg for hep A, B and HIV  - would recommend close f/up with US and potential biopsy   #GERD: Patient on Nexium at home for acid reflux which was not started on admission.  -  Protonix  #Headache: improved  -not good candidate for tylenol: Due to cirrhosis above    #Substance abuse- hx of cocaine abuse and EtOH abuse and also potentially has a psychiatric history which certainly makes addiction more difficult  - on CIWA: 0-8: =  #Tobacco abuse  -nicotine patch prn  -counseling per nursing   #Pulmonary nodule- Noted incidentally on CT-abdomen, however with a strong smoking history, this is highly concerning for malignancy  -repeat CT in 3-6 months   FEN/GI: reg diet, NS @ 200cc/hr Prophylaxis: hep sq   Disposition: SDU pending improvement in BPs  Subjective:  Patient feeling fine today with no complaints.    Objective: Temp:  [97.8 F (36.6 C)-98.4 F (36.9 C)] 97.8 F (36.6 C) (08/09 0801) Pulse Rate:  [33-49] 43 (08/09 0801) Resp:  [13-27] 18 (08/09 0801) BP: (99-163)/(50-99) 127/68 mmHg (08/09 0801) SpO2:  [95 %-100 %]  100 % (08/09 0801) Physical Exam: General: Lying in bed sleeping HEENT: NCAT, Oropharynx clear. MMM Cardiovascular: RRR, no m/r/g  Respiratory: nml WOB, crackles noted on right lung base but otherwise CTAB  Abdomen: large midline scars, well healed, +BS, soft, non-distended, tender in the epigastric region. Extremities: No gross deformities  Skin: no evidence of breakdown  Neuro: Speech clear. No gross focal deficits appreciated   Laboratory:  Recent Labs Lab 04/02/14 0820 04/03/14 0343 04/04/14 0404  WBC 15.2* 11.8* 10.1  HGB 11.0* 10.4* 11.1*  HCT 30.9* 29.2* 31.2*   PLT 161 158 164    Recent Labs Lab 04/01/14 1310  04/02/14 0820 04/03/14 0343 04/04/14 0404  NA 137  < > 141 141 140  K 3.7  < > 4.4 4.0 3.6*  CL 95*  < > 110 112 111  CO2 23  < > 17* 16* 15*  BUN 40*  < > 39* 45* 46*  CREATININE 4.95*  < > 3.07* 1.99* 1.62*  CALCIUM 8.9  < > 7.3* 7.4* 8.0*  PROT 7.9  --  6.3  --   --   BILITOT 1.0  --  0.7  --   --   ALKPHOS 79  --  75  --   --   ALT 32  --  26  --   --   AST 42*  --  49*  --   --   GLUCOSE 166*  < > 113* 126* 117*  < > = values in this interval not displayed.  UDS: Positive for cocaine and THC ETOH level <11  Micro: U/A: moderate bilirubin, positive nitrite, large leukocytes, large Hgb, numerous wBCs, 21-50 RBCs, many bacteria, calcium oxylate crystals, hyaline casts   Imaging/Diagnostic Tests: CT abdomen 8/6: Hepatic contour is irregular. This suggests cirrhosis. Serpiginous nodular densities are noted in the perigastric, peripancreatic, perisplenic, and retroperitoneal regions suggesting varices. Right renal enlargement with right perirenal fat plane stranding . Infectious or infiltrating right renal process exclude pyelonephritis cannot be excluded. Small calcific density noted in the right renal pelvis region. 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 However tiny right renal pelvic stone cannot be completely excluded. No hydronephrosis on the right is present . Foley catheter is in the bladder. The bladder is nondistended. 7 mm pulmonary nodule right lower lobe. If the patient is at high risk for bronchogenic carcinoma   Myra Rude, MD 04/04/2014, 8:38 AM PGY-2, [place] [facility] FPTS Intern pager: [phone], text pages welcome

## 2014-04-07 LAB — CULTURE, BLOOD (ROUTINE X 2): Culture: NO GROWTH

## 2014-04-30 NOTE — Discharge Summary (Signed)
FMTS ATTENDING ADMISSION NOTE Dana Danzer,MD I  have seen and examined this patient, reviewed their chart. I have discussed this patient with the resident. I agree with the resident's findings, assessment and care plan.   

## 2014-04-30 NOTE — Discharge Summary (Signed)
Family Medicine Teaching [facility] Discharge Summary  Patient name: Dana Mcintosh Medical record number: [id number] Date of birth: 12/29/1950 Age: 63 y.o. Gender: female Date of Admission: 04/01/2014  Date of Discharge: Left AMA 04/04/14 Admitting Physician: Janit Pagan, MD  Primary Care Provider: Default, Provider, MD Consultants: Nephrology, CCM  Indication for Hospitalization: dizziness, anuria, and hypotension.  Discharge Diagnoses/Problem List:  Patient Active Problem List   Diagnosis Date Noted  . Severe sepsis 04/02/2014  . Acute pyelonephritis 04/02/2014  . Hepatitis C antibody test positive 04/02/2014  . Cirrhosis 04/02/2014  . Acute renal failure 04/01/2014  . Depressive disorder 07/31/2012  . Alcohol abuse with intoxication 07/30/2012    Class: Acute  . Cocaine abuse 07/30/2012    Class: Acute  . Cannabis abuse 07/30/2012    Class: Acute   Disposition: home   Discharge Condition: stable   Discharge Exam:   Brief Hospital Course:  Dana Mcintosh is a 63 y.o. female presenting with dizziness and0 anuria found to be hypotensive with a creatinine of 5.5. PMH is significant for depression, substance abuse   #E. Coli urosepsis: Patient presented with HR>90, hypotension and leukocytosis 29.9 and a UA postivie with nitrites and leukocytes.  Urine cx >100,000 colonies of Ecoli and one blood cx (+) for E. Coli  CTX was initially started. She was transitioned to cipro PO (discussed with pharmacy) today 500 mg daily at the time of leaving AMA.   Issues for Follow Up:  Given antibiotics when leaving AMA.   Significant Procedures:   Significant Labs and Imaging:  No results found for this basename: WBC, HGB, HCT, PLT,  in the last 168 hours No results found for this basename: NA, K, CL, CO2, GLUCOSE, BUN, CREATININE, CALCIUM, MG, PHOS, ALKPHOS, AST, ALT, ALBUMIN, PROTEIN, TBILI,  in the last 168 hours  Urinalysis    Component Value Date/Time   COLORURINE AMBER*  04/01/2014 1430   APPEARANCEUR TURBID* 04/01/2014 1430   LABSPEC 1.020 04/01/2014 1430   PHURINE 5.5 04/01/2014 1430   GLUCOSEU NEGATIVE 04/01/2014 1430   HGBUR LARGE* 04/01/2014 1430   BILIRUBINUR MODERATE* 04/01/2014 1430   KETONESUR 15* 04/01/2014 1430   PROTEINUR >300* 04/01/2014 1430   UROBILINOGEN 1.0 04/01/2014 1430   NITRITE POSITIVE* 04/01/2014 1430   LEUKOCYTESUR LARGE* 04/01/2014 1430   UDS: Positive for cocaine and THC  ETOH level <11  TSH: 1.58  Micro:  Hep A IgM: NR  Hep B C IgM: Neg  HCV Ab: reactive  HIV: NR  U/A: moderate bilirubin, positive nitrite, large leukocytes, large Hgb, numerous wBCs, 21-50 RBCs, many bacteria, calcium oxylate crystals, hyaline casts  Urine cx 8/6: >100,000 colonies E.col  Blood cx 8/6: Gram neg rods in 1 bottle  Imaging/Diagnostic Tests:  CT abdomen 8/6: Hepatic contour is irregular. This suggests cirrhosis. Serpiginous nodular densities are noted in the perigastric, peripancreatic, perisplenic, and retroperitoneal regions suggesting varices. Right renal enlargement with right perirenal fat plane stranding . Infectious or infiltrating right renal process exclude pyelonephritis cannot be excluded. Small calcific density noted in the right renal pelvis region. 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 However tiny right renal pelvic stone cannot be completely excluded. No hydronephrosis on the right is present . Foley catheter is in the bladder. The bladder is nondistended. 7 mm pulmonary nodule right lower lobe. If the patient is at high risk for bronchogenic carcinoma  Results/Tests Pending at Time of Discharge:   Discharge  Medications:    Medication List    TAKE these medications       ciprofloxacin 500 MG tablet  Commonly known as:  CIPRO  Take 1 tablet (500 mg total) by mouth daily.      ASK your doctor about these medications       esomeprazole 40 MG capsule  Commonly known as:  NEXIUM   Take 40 mg by mouth daily at 12 noon.     gabapentin 300 MG capsule  Commonly known as:  NEURONTIN  Take 300 mg by mouth 2 (two) times daily.     lisinopril-hydrochlorothiazide 10-12.5 MG per tablet  Commonly known as:  PRINZIDE,ZESTORETIC  Take 1 tablet by mouth daily.        Discharge Instructions: Please refer to Patient Instructions section of EMR for full details.  Patient was counseled important signs and symptoms that should prompt return to medical care, changes in medications, dietary instructions, activity restrictions, and follow up appointments.   Follow-Up Appointments:   Myra Rude, MD 04/30/2014, 2:05 PM PGY-2, [facility] Family [facility]

## 2014-05-05 ENCOUNTER — Ambulatory Visit: Payer: Self-pay | Admitting: Cardiology

## 2014-05-26 ENCOUNTER — Encounter: Payer: Self-pay | Admitting: Cardiology

## 2014-06-16 ENCOUNTER — Encounter (HOSPITAL_COMMUNITY): Payer: Self-pay | Admitting: Emergency Medicine

## 2014-06-16 ENCOUNTER — Encounter (HOSPITAL_COMMUNITY): Payer: Self-pay

## 2014-06-16 ENCOUNTER — Inpatient Hospital Stay (HOSPITAL_COMMUNITY)
Admission: AD | Admit: 2014-06-16 | Discharge: 2014-06-22 | DRG: 897 | Disposition: A | Discharge status: Home / Self Care | Payer: Medicaid Other | Source: Intra-hospital | Attending: Psychiatry | Admitting: Psychiatry

## 2014-06-16 ENCOUNTER — Emergency Department (HOSPITAL_COMMUNITY)
Admission: EM | Admit: 2014-06-16 | Discharge: 2014-06-16 | Disposition: A | Discharge status: Other Acute Inpatient Hospital | Payer: Medicaid Other | Attending: Emergency Medicine | Admitting: Emergency Medicine

## 2014-06-16 DIAGNOSIS — F410 Panic disorder [episodic paroxysmal anxiety] without agoraphobia: Secondary | ICD-10-CM | POA: Diagnosis present

## 2014-06-16 DIAGNOSIS — Z880 Allergy status to penicillin: Secondary | ICD-10-CM | POA: Insufficient documentation

## 2014-06-16 DIAGNOSIS — F10230 Alcohol dependence with withdrawal, uncomplicated: Secondary | ICD-10-CM

## 2014-06-16 DIAGNOSIS — F1094 Alcohol use, unspecified with alcohol-induced mood disorder: Secondary | ICD-10-CM | POA: Diagnosis present

## 2014-06-16 DIAGNOSIS — F1010 Alcohol abuse, uncomplicated: Secondary | ICD-10-CM | POA: Diagnosis not present

## 2014-06-16 DIAGNOSIS — F1020 Alcohol dependence, uncomplicated: Principal | ICD-10-CM | POA: Diagnosis present

## 2014-06-16 DIAGNOSIS — Z79899 Other long term (current) drug therapy: Secondary | ICD-10-CM | POA: Diagnosis not present

## 2014-06-16 DIAGNOSIS — Z8619 Personal history of other infectious and parasitic diseases: Secondary | ICD-10-CM | POA: Diagnosis not present

## 2014-06-16 DIAGNOSIS — G4700 Insomnia, unspecified: Secondary | ICD-10-CM | POA: Diagnosis present

## 2014-06-16 DIAGNOSIS — F1210 Cannabis abuse, uncomplicated: Secondary | ICD-10-CM | POA: Diagnosis not present

## 2014-06-16 DIAGNOSIS — Z599 Problem related to housing and economic circumstances, unspecified: Secondary | ICD-10-CM

## 2014-06-16 DIAGNOSIS — F333 Major depressive disorder, recurrent, severe with psychotic symptoms: Secondary | ICD-10-CM

## 2014-06-16 DIAGNOSIS — Z008 Encounter for other general examination: Secondary | ICD-10-CM | POA: Diagnosis present

## 2014-06-16 DIAGNOSIS — Z8249 Family history of ischemic heart disease and other diseases of the circulatory system: Secondary | ICD-10-CM | POA: Diagnosis not present

## 2014-06-16 DIAGNOSIS — R45851 Suicidal ideations: Secondary | ICD-10-CM | POA: Diagnosis present

## 2014-06-16 DIAGNOSIS — R768 Other specified abnormal immunological findings in serum: Secondary | ICD-10-CM

## 2014-06-16 DIAGNOSIS — F32A Depression, unspecified: Secondary | ICD-10-CM

## 2014-06-16 DIAGNOSIS — F332 Major depressive disorder, recurrent severe without psychotic features: Secondary | ICD-10-CM | POA: Diagnosis present

## 2014-06-16 DIAGNOSIS — I10 Essential (primary) hypertension: Secondary | ICD-10-CM | POA: Insufficient documentation

## 2014-06-16 DIAGNOSIS — F1410 Cocaine abuse, uncomplicated: Secondary | ICD-10-CM | POA: Insufficient documentation

## 2014-06-16 DIAGNOSIS — Z720 Tobacco use: Secondary | ICD-10-CM | POA: Diagnosis not present

## 2014-06-16 DIAGNOSIS — F17210 Nicotine dependence, cigarettes, uncomplicated: Secondary | ICD-10-CM | POA: Diagnosis present

## 2014-06-16 DIAGNOSIS — F329 Major depressive disorder, single episode, unspecified: Secondary | ICD-10-CM | POA: Diagnosis not present

## 2014-06-16 DIAGNOSIS — Z833 Family history of diabetes mellitus: Secondary | ICD-10-CM | POA: Diagnosis not present

## 2014-06-16 DIAGNOSIS — F323 Major depressive disorder, single episode, severe with psychotic features: Secondary | ICD-10-CM | POA: Diagnosis present

## 2014-06-16 DIAGNOSIS — K7030 Alcoholic cirrhosis of liver without ascites: Secondary | ICD-10-CM

## 2014-06-16 HISTORY — DX: Unspecified viral hepatitis C without hepatic coma: B19.20

## 2014-06-16 LAB — CBC
HCT: 42.8 % (ref 36.0–46.0)
Hemoglobin: 15.4 g/dL — ABNORMAL HIGH (ref 12.0–15.0)
MCH: 34.4 pg — ABNORMAL HIGH (ref 26.0–34.0)
MCHC: 36 g/dL (ref 30.0–36.0)
MCV: 95.5 fL (ref 78.0–100.0)
Platelets: 160 10*3/uL (ref 150–400)
RBC: 4.48 MIL/uL (ref 3.87–5.11)
RDW: 13.7 % (ref 11.5–15.5)
WBC: 7 10*3/uL (ref 4.0–10.5)

## 2014-06-16 LAB — COMPREHENSIVE METABOLIC PANEL
ALT: 177 U/L — ABNORMAL HIGH (ref 0–35)
AST: 288 U/L — ABNORMAL HIGH (ref 0–37)
Albumin: 3.9 g/dL (ref 3.5–5.2)
Alkaline Phosphatase: 91 U/L (ref 39–117)
Anion gap: 15 (ref 5–15)
BUN: 4 mg/dL — ABNORMAL LOW (ref 6–23)
CO2: 25 mEq/L (ref 19–32)
Calcium: 9.9 mg/dL (ref 8.4–10.5)
Chloride: 102 mEq/L (ref 96–112)
Creatinine, Ser: 0.69 mg/dL (ref 0.50–1.10)
GFR calc Af Amer: >90 mL/min (ref >90)
GFR calc non Af Amer: >90 mL/min (ref >90)
Glucose, Bld: 94 mg/dL (ref 70–99)
Potassium: 3.3 mEq/L — ABNORMAL LOW (ref 3.7–5.3)
Sodium: 142 mEq/L (ref 137–147)
Total Bilirubin: 0.6 mg/dL (ref 0.3–1.2)
Total Protein: 9.1 g/dL — ABNORMAL HIGH (ref 6.0–8.3)

## 2014-06-16 LAB — SALICYLATE LEVEL: Salicylate Lvl: <2 mg/dL — ABNORMAL LOW (ref 2.8–20.0)

## 2014-06-16 LAB — RAPID URINE DRUG SCREEN, HOSP PERFORMED
Amphetamines: NOT DETECTED
Barbiturates: NOT DETECTED
Benzodiazepines: NOT DETECTED
Cocaine: POSITIVE — AB
Opiates: NOT DETECTED
Tetrahydrocannabinol: POSITIVE — AB

## 2014-06-16 LAB — ETHANOL: Alcohol, Ethyl (B): 30 mg/dL — ABNORMAL HIGH (ref 0–11)

## 2014-06-16 LAB — ACETAMINOPHEN LEVEL: Acetaminophen (Tylenol), Serum: <15 ug/mL (ref 10–30)

## 2014-06-16 MED ORDER — GABAPENTIN 300 MG PO CAPS
300.0000 mg | ORAL_CAPSULE | Freq: Two times a day (BID) | ORAL | Status: DC
  Administered 2014-06-16 – 2014-06-21 (×11): 300 mg via ORAL
  Filled 2014-06-16 (×4): qty 1
  Filled 2014-06-16: qty 28
  Filled 2014-06-16 (×3): qty 1
  Filled 2014-06-16: qty 28
  Filled 2014-06-16 (×2): qty 1
  Filled 2014-06-16: qty 28
  Filled 2014-06-16 (×7): qty 1
  Filled 2014-06-16: qty 28

## 2014-06-16 MED ORDER — ADULT MULTIVITAMIN W/MINERALS CH
1.0000 | ORAL_TABLET | Freq: Every day | ORAL | Status: DC
  Administered 2014-06-17 – 2014-06-21 (×5): 1 via ORAL
  Filled 2014-06-16 (×8): qty 1

## 2014-06-16 MED ORDER — LOPERAMIDE HCL 2 MG PO CAPS: 2.0000 mg | ORAL_CAPSULE | ORAL | Status: AC | PRN

## 2014-06-16 MED ORDER — LORAZEPAM 1 MG PO TABS
1.0000 mg | ORAL_TABLET | Freq: Every day | ORAL | Status: AC
  Administered 2014-06-21: 1 mg via ORAL
  Filled 2014-06-16: qty 1

## 2014-06-16 MED ORDER — NICOTINE 21 MG/24HR TD PT24
21.0000 mg | MEDICATED_PATCH | Freq: Every day | TRANSDERMAL | Status: DC
  Administered 2014-06-16: 21 mg via TRANSDERMAL
  Filled 2014-06-16: qty 1

## 2014-06-16 MED ORDER — LORAZEPAM 1 MG PO TABS
1.0000 mg | ORAL_TABLET | Freq: Three times a day (TID) | ORAL | Status: AC
  Administered 2014-06-19: 1 mg via ORAL
  Filled 2014-06-16 (×2): qty 1

## 2014-06-16 MED ORDER — ACETAMINOPHEN 325 MG PO TABS: 650.0000 mg | ORAL_TABLET | ORAL | Status: DC | PRN

## 2014-06-16 MED ORDER — HYDROXYZINE HCL 25 MG PO TABS
25.0000 mg | ORAL_TABLET | Freq: Four times a day (QID) | ORAL | Status: AC | PRN
Start: 2014-06-16 — End: 2014-06-19

## 2014-06-16 MED ORDER — ONDANSETRON 4 MG PO TBDP: 4.0000 mg | ORAL_TABLET | Freq: Four times a day (QID) | ORAL | Status: AC | PRN

## 2014-06-16 MED ORDER — CLONIDINE HCL 0.1 MG PO TABS
0.1000 mg | ORAL_TABLET | Freq: Once | ORAL | Status: AC
  Administered 2014-06-16: 0.1 mg via ORAL
  Filled 2014-06-16: qty 1

## 2014-06-16 MED ORDER — POTASSIUM CHLORIDE CRYS ER 20 MEQ PO TBCR
20.0000 meq | EXTENDED_RELEASE_TABLET | Freq: Two times a day (BID) | ORAL | Status: AC
  Administered 2014-06-16 – 2014-06-17 (×3): 20 meq via ORAL
  Filled 2014-06-16 (×5): qty 1

## 2014-06-16 MED ORDER — LORAZEPAM 1 MG PO TABS
1.0000 mg | ORAL_TABLET | Freq: Four times a day (QID) | ORAL | Status: AC
  Administered 2014-06-16 – 2014-06-18 (×5): 1 mg via ORAL
  Filled 2014-06-16 (×5): qty 1

## 2014-06-16 MED ORDER — TRAZODONE HCL 50 MG PO TABS
50.0000 mg | ORAL_TABLET | Freq: Every evening | ORAL | Status: DC | PRN
  Administered 2014-06-16 – 2014-06-21 (×6): 50 mg via ORAL
  Filled 2014-06-16 (×9): qty 1
  Filled 2014-06-16: qty 28
  Filled 2014-06-16 (×2): qty 1
  Filled 2014-06-16: qty 28
  Filled 2014-06-16: qty 1
  Filled 2014-06-16: qty 28
  Filled 2014-06-16 (×2): qty 1
  Filled 2014-06-16: qty 28

## 2014-06-16 MED ORDER — LORAZEPAM 1 MG PO TABS
1.0000 mg | ORAL_TABLET | Freq: Two times a day (BID) | ORAL | Status: AC
  Administered 2014-06-19: 1 mg via ORAL

## 2014-06-16 MED ORDER — LORAZEPAM 1 MG PO TABS
1.0000 mg | ORAL_TABLET | Freq: Four times a day (QID) | ORAL | Status: AC | PRN
  Administered 2014-06-19: 1 mg via ORAL
  Filled 2014-06-16: qty 1

## 2014-06-16 MED ORDER — ALUM & MAG HYDROXIDE-SIMETH 200-200-20 MG/5ML PO SUSP: 30.0000 mL | ORAL | Status: DC | PRN

## 2014-06-16 MED ORDER — THIAMINE HCL 100 MG/ML IJ SOLN: 100.0000 mg | Freq: Once | INTRAMUSCULAR | Status: DC

## 2014-06-16 MED ORDER — MAGNESIUM HYDROXIDE 400 MG/5ML PO SUSP
30.0000 mL | Freq: Every day | ORAL | Status: DC | PRN
  Administered 2014-06-17: 30 mL via ORAL

## 2014-06-16 MED ORDER — LORAZEPAM 1 MG PO TABS: 0.0000 mg | ORAL_TABLET | Freq: Two times a day (BID) | ORAL | Status: DC

## 2014-06-16 MED ORDER — ONDANSETRON HCL 4 MG PO TABS: 4.0000 mg | ORAL_TABLET | Freq: Three times a day (TID) | ORAL | Status: DC | PRN

## 2014-06-16 MED ORDER — VITAMIN B-1 100 MG PO TABS
100.0000 mg | ORAL_TABLET | Freq: Every day | ORAL | Status: DC
  Administered 2014-06-17 – 2014-06-21 (×6): 100 mg via ORAL
  Filled 2014-06-16 (×8): qty 1

## 2014-06-16 MED ORDER — ACETAMINOPHEN 325 MG PO TABS
650.0000 mg | ORAL_TABLET | Freq: Once | ORAL | Status: AC
  Administered 2014-06-16: 650 mg via ORAL
  Filled 2014-06-16: qty 2

## 2014-06-16 MED ORDER — LORAZEPAM 1 MG PO TABS
0.0000 mg | ORAL_TABLET | Freq: Four times a day (QID) | ORAL | Status: DC
  Administered 2014-06-16: 1 mg via ORAL
  Filled 2014-06-16: qty 1

## 2014-06-16 NOTE — ED Notes (Signed)
Pt presents wanting detox from ETOH, marijuana, and crack.  Last drink, 1 pint of gin x 5 hours ago.  Last drug use within the past 24hrs.  Pt is expressing hopelessness and is concerned about what may happen, if she doesn't change.  Denies SI/HI/hallucinations.  Pt has not taken HTN medication in 3 months.  Sts chronic joint pain.  Pain score 2/10.

## 2014-06-16 NOTE — ED Notes (Signed)
Black kitchen knife in locker 28

## 2014-06-16 NOTE — BH Assessment (Addendum)
[facility] by Assunta FoundShuvon Rankin, NP. Bed assignment 302-2. Nursing report # [phone]. Support paperwork completed.

## 2014-06-16 NOTE — BH Assessment (Signed)
Tele Assessment Note   Dana Mcintosh is an 63 y.o. female. She has a hx of substance abuse and depression. Pt presents wanting detox from ETOH, marijuana, and crack. Last drink, 1 pint of gin x 5 hours ago. Last drug use within the past 24hrs. No hx of seizures, black outs, and/or DT's. She currently reports cold chills and fatigue. She reports 7 yrs of sobriety in the past.  Pt is expressing hopelessness and is concerned about what may happen, if she doesn't change. She admits to suicidal ideations earlier this morning. She continues express passive suicidal ideations. No suicidal plan and /or intent. Patient reports a hx of multiple suicide attempts. Sts that in the past she tried to drown self and OD. Triggers for current depression are related to her relapse of substance use 2-3 months ago. Pt reports loss of interest in usual pleasures and increased irritability. She also reports increased anxiety. Appetite has decreased with 40-50 pounds of weight loss in the past 2-3 months. Sleep is poor. Pt reports multiple prior mental health/substance hospitalizations. [facility]. No mental health/substance abuse providers reported.   Axis I:  Polysubstance Abuse and Depressive Disorder NOs Axis II: Deferred Axis III:  Past Medical History  Diagnosis Date  . Hypertension   . ETOH abuse   . Substance abuse   . Mental disorder   . Depression   . Hepatitis C    Axis IV: other psychosocial or environmental problems, problems related to social environment, problems with access to health care services and problems with primary support group Axis V: 31-40 impairment in reality testing  Past Medical History:  Past Medical History  Diagnosis Date  . Hypertension   . ETOH abuse   . Substance abuse   . Mental disorder   . Depression   . Hepatitis C     Past Surgical History  Procedure Laterality Date  . Cholecystectomy    . Tubal ligation      Family History:   Family History  Problem Relation Age of Onset  . Diabetes Mother   . Hypertension Mother   . Cancer Father     Social History:  reports that she has been smoking Cigarettes.  She has a 20 pack-year smoking history. She does not have any smokeless tobacco history on file. She reports that she drinks about 26.4 ounces of alcohol per week. She reports that she uses illicit drugs (Cocaine and Marijuana) about 7 times per week.  Additional Social History:  Alcohol / Drug Use Pain Medications: SEE MAR Prescriptions: SEE MAR Over the Counter: SEE MAR History of alcohol / drug use?: Yes Substance #1 Name of Substance 1: Alcohol  1 - Age of First Use: 20 yrs ago 1 - Amount (size/oz): 1/2 pint to 1 pint of liqour  1 - Frequency: daily  1 - Duration: 2-3  1 - Last Use / Amount: 06/17/2014; "This morning" Substance #2 Name of Substance 2: THC 2 - Age of First Use: "I can't remember" 2 - Amount (size/oz): "Very little" 2 - Frequency: daily  2 - Duration: on-going  2 - Last Use / Amount: 06/17/2014; "This morning" Substance #3 Name of Substance 3: Crack Cocaine  3 - Age of First Use: "I didn't start until I was in your 40's" 3 - Amount (size/oz): 1-2 grams 3 - Frequency: daily  3 - Duration: on-going  3 - Last Use / Amount: 06/17/2014; "This morning"  CIWA: CIWA-Ar BP:  130/80 mmHg Pulse Rate: 69 Nausea and Vomiting: no nausea and no vomiting Tactile Disturbances: none Tremor: not visible, but can be felt fingertip to fingertip Auditory Disturbances: not present Paroxysmal Sweats: no sweat visible Visual Disturbances: not present Anxiety: two Headache, Fullness in Head: very mild Agitation: normal activity Orientation and Clouding of Sensorium: oriented and can do serial additions CIWA-Ar Total: 4 COWS:    PATIENT STRENGTHS: (choose at least two) Ability for insight  Allergies:  Allergies  Allergen Reactions  . Penicillins Hives    Home Medications:  (Not in a  hospital admission)  OB/GYN Status:  Patient's last menstrual period was 07/27/1997.  General Assessment Data Location of Assessment: [facility] Is this a Tele or Face-to-Face Assessment?: Face-to-Face Is this an Initial Assessment or a Re-assessment for this encounter?: Initial Assessment Living Arrangements: Other (Comment) (lives with adult children) Can pt return to current living arrangement?: No Admission Status: Voluntary Is patient capable of signing voluntary admission?: Yes Transfer from: Acute Hospital Referral Source: Self/Family/Friend     [facility] Crisis Care Plan Living Arrangements: Other (Comment) (lives with adult children) Name of Psychiatrist:  (No psychiatrist ) Name of Therapist:  (No therapist )     Risk to self with the past 6 months Suicidal Ideation: No-Not Currently/Within Last 6 Months Suicidal Intent: No-Not Currently/Within Last 6 Months Is patient at risk for suicide?: Yes Suicidal Plan?: No Specify Current Suicidal Plan:  (n/a) Access to Means: Yes Specify Access to Suicidal Means:  (sharp objects) What has been your use of drugs/alcohol within the last 12 months?:  (Pt reports alcohol, thc, and cocaine use ) Previous Attempts/Gestures: Yes How many times?:  (multiple; pt tried to drown self and OD in the past ) Other Self Harm Risks:  (n/a) Intentional Self Injurious Behavior: None Family Suicide History: No Recent stressful life event(s): Other (Comment);Conflict (Comment);Financial Problems Persecutory voices/beliefs?: No Depression: Yes Depression Symptoms: Feeling angry/irritable;Feeling worthless/self pity;Loss of interest in usual pleasures;Isolating;Fatigue Substance abuse history and/or treatment for substance abuse?: No Suicide prevention information given to non-admitted patients: Not applicable  Risk to Others within the past 6 months Homicidal Ideation: No Thoughts of Harm to Others: No Current Homicidal Intent: No Current Homicidal  Plan: No Access to Homicidal Means: No Identified Victim:  (n/a) History of harm to others?: No Assessment of Violence: None Noted Violent Behavior Description:  (patient is calm and cooperative) Does patient have access to weapons?: No Criminal Charges Pending?: No Does patient have a court date: No  Psychosis Hallucinations: None noted Delusions: None noted  Mental Status Report Appear/Hygiene: Disheveled Eye Contact: Good Motor Activity: Freedom of movement Speech: Logical/coherent Level of Consciousness: Alert Mood: Depressed Affect: Appropriate to circumstance Anxiety Level: Minimal Thought Processes: Relevant;Coherent Judgement: Impaired Orientation: Place;Time;Situation;Person Obsessive Compulsive Thoughts/Behaviors: None  Cognitive Functioning Concentration: Decreased Memory: Recent Intact;Remote Intact IQ: Average Insight: Poor Impulse Control: Poor Appetite: Poor Weight Loss:  (40-50 pounds in the past 2-3 months ) Weight Gain:  (none reported) Sleep: Decreased Total Hours of Sleep:  ("I don't sleep at all") Vegetative Symptoms: None  ADLScreening [facility] Assessment Services) Patient's cognitive ability adequate to safely complete daily activities?: Yes Patient able to express need for assistance with ADLs?: Yes Independently performs ADLs?: Yes (appropriate for developmental age)  Prior Inpatient Therapy Prior Inpatient Therapy: Yes Prior Therapy Dates:  ("I have been in tx so many times I can't count") Prior Therapy Facilty/Provider(s):  (last tx was at [facility]) Reason for Treatment:  (none reported )  Prior  Outpatient Therapy Prior Outpatient Therapy: No Prior Therapy Dates:  (n/a) Prior Therapy Facilty/Provider(s):  (n/a) Reason for Treatment:  (n/a)  ADL Screening (condition at time of admission) Patient's cognitive ability adequate to safely complete daily activities?: Yes Is the patient deaf or have difficulty hearing?: No Does the patient have  difficulty seeing, even when wearing glasses/contacts?: Yes Does the patient have difficulty concentrating, remembering, or making decisions?: No Patient able to express need for assistance with ADLs?: Yes Does the patient have difficulty dressing or bathing?: No Independently performs ADLs?: Yes (appropriate for developmental age) Does the patient have difficulty walking or climbing stairs?: No Weakness of Legs: None Weakness of Arms/Hands: None  Home Assistive Devices/Equipment Home Assistive Devices/Equipment: None    Abuse/Neglect Assessment (Assessment to be complete while patient is alone) Physical Abuse: Denies Verbal Abuse: Denies Sexual Abuse: Denies Exploitation of patient/patient's resources: Denies Self-Neglect: Denies Values / Beliefs Cultural Requests During Hospitalization: None Spiritual Requests During Hospitalization: None   Advance Directives (For Healthcare) Does patient have an advance directive?: No Would patient like information on creating an advanced directive?: No - patient declined information Nutrition Screen- [facility] Adult/[facility] Patient's home diet: Regular  Additional Information 1:1 In Past 12 Months?: No CIRT Risk: No Elopement Risk: No Does patient have medical clearance?: Yes     Disposition:  Disposition Initial Assessment Completed for this Encounter: Yes Disposition of Patient: Inpatient treatment program ([facility] by Assunta FoundShuvon Rankin, NP Room 305-1) Type of inpatient treatment program: Adult  Octaviano Battyerry, Taelor Moncada Mona 06/16/2014 6:55 PM

## 2014-06-16 NOTE — ED Provider Notes (Signed)
CSN: [id number]     Arrival date & time 06/16/14  1131 History   First MD Initiated Contact with Patient 06/16/14 1250     Chief Complaint  Patient presents with  . ETOH Detox    . Crack Detox      (Consider location/radiation/quality/duration/timing/severity/associated sxs/prior Treatment) Patient is a 63 y.o. female presenting with alcohol problem. The history is provided by the patient. No language interpreter was used.  Alcohol Problem This is a chronic problem. Pertinent negatives include no chills or fever. Associated symptoms comments: She presents asking for help with alcohol and crack cocaine dependence. She denies SI/HI or attempt. Last used this morning. No pain, vomiting..    Past Medical History  Diagnosis Date  . Hypertension   . ETOH abuse   . Substance abuse   . Mental disorder   . Depression   . Hepatitis C    Past Surgical History  Procedure Laterality Date  . Cholecystectomy    . Tubal ligation     Family History  Problem Relation Age of Onset  . Diabetes Mother   . Hypertension Mother   . Cancer Father    History  Substance Use Topics  . Smoking status: Current Every Day Smoker -- 0.50 packs/day for 40 years    Types: Cigarettes  . Smokeless tobacco: Not on file  . Alcohol Use: 26.4 oz/week    24 Cans of beer, 20 Shots of liquor per week   OB History   Grav Para Term Preterm Abortions TAB SAB Ect Mult Living                 Review of Systems  Constitutional: Negative for fever and chills.  HENT: Negative.   Respiratory: Negative.   Cardiovascular: Negative.   Gastrointestinal: Negative.   Musculoskeletal: Negative.   Skin: Negative.   Neurological: Negative.   Psychiatric/Behavioral: Positive for dysphoric mood. Negative for suicidal ideas and hallucinations.      Allergies  Penicillins  Home Medications   Prior to Admission medications   Medication Sig Start Date End Date Taking? Authorizing Provider  acetaminophen (TYLENOL)  500 MG tablet Take 1,000 mg by mouth every 6 (six) hours as needed for moderate pain (foot pain).   Yes Historical Provider, MD  gabapentin (NEURONTIN) 300 MG capsule Take 300 mg by mouth 2 (two) times daily.   Yes Historical Provider, MD   BP 153/99  Pulse 74  Temp(Src) 98.8 F (37.1 C) (Oral)  Resp 16  SpO2 99%  LMP 07/27/1997 Physical Exam  Constitutional: She is oriented to person, place, and time. She appears well-developed and well-nourished.  HENT:  Head: Normocephalic.  Neck: Normal range of motion. Neck supple.  Cardiovascular: Normal rate and regular rhythm.   Pulmonary/Chest: Effort normal and breath sounds normal.  Abdominal: Soft. Bowel sounds are normal. There is no tenderness. There is no rebound and no guarding.  Musculoskeletal: Normal range of motion.  Neurological: She is alert and oriented to person, place, and time.  Skin: Skin is warm and dry. No rash noted.  Psychiatric: She has a normal mood and affect.    ED Course  Procedures (including critical care time) Labs Review Labs Reviewed  CBC - Abnormal; Notable for the following:    Hemoglobin 15.4 (*)    MCH 34.4 (*)    All other components within normal limits  COMPREHENSIVE METABOLIC PANEL - Abnormal; Notable for the following:    Potassium 3.3 (*)    BUN 4 (*)  Total Protein 9.1 (*)    AST 288 (*)    ALT 177 (*)    All other components within normal limits  ETHANOL - Abnormal; Notable for the following:    Alcohol, Ethyl (B) 30 (*)    All other components within normal limits  SALICYLATE LEVEL - Abnormal; Notable for the following:    Salicylate Lvl <2.0 (*)    All other components within normal limits  URINE RAPID DRUG SCREEN (HOSP PERFORMED) - Abnormal; Notable for the following:    Cocaine POSITIVE (*)    Tetrahydrocannabinol POSITIVE (*)    All other components within normal limits  ACETAMINOPHEN LEVEL    Imaging Review No results found.   EKG Interpretation None      MDM    Final diagnoses:  None    1. Polysubstance abuse/dependence  Patient moved to psych holding for evaluation for placement in treatment program. She appears stable, coherent, cooperative.     Arnoldo HookerShari A Deondrick Searls, PA-C 06/25/14 1354

## 2014-06-16 NOTE — ED Notes (Signed)
Pt belongings in locker 33, 3 belonging bags, 1 larger bag, all searched

## 2014-06-16 NOTE — ED Notes (Signed)
Report called to RN [place], [facility] rm  302-2,  Pending [facility] transport.

## 2014-06-16 NOTE — ED Notes (Signed)
[facility] transport requested. 
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
## 2014-06-16 NOTE — Progress Notes (Signed)
  CARE MANAGEMENT ED NOTE 06/16/2014  Patient:  Dana Mcintosh,Dana Mcintosh   Account Number:  [id number]  Date Initiated:  06/16/2014  Documentation initiated by:  Edd ArbourGIBBS,Avana Kreiser  Subjective/Objective Assessment:   63 [place] pt wanting detox from ETOH, marijuana, and crack.  Last drink, 1 pint of gin x 5 hours ago.  Last drug use within the past 24hrs.  Pt is expressing hopelessness and is concerned about what may happen, if she doesn't     Subjective/Objective Assessment Detail:   change  pt confirms no pcp     Action/Plan:   CM offered pt a list of medicaid [place] providers for [place] Pt asked to speak on phone Instructions provided Pt [facility] with her CM [facility] request   Action/Plan Detail:   Anticipated DC Date:       Status Recommendation to Physician:   Result of Recommendation:    Other ED Services  Consult Working Plan    DC Planning Services  Other  Outpatient Services - Pt will follow up  PCP issues    Choice offered to / List presented to:            Status of service:  Completed, signed off  ED Comments:   ED Comments Detail:

## 2014-06-16 NOTE — ED Provider Notes (Signed)
  Physical Exam  BP 130/80  Pulse 69  Temp(Src) 98.1 F (36.7 C) (Oral)  Resp 16  SpO2 100%  LMP 07/27/1997  Physical Exam  ED Course  Procedures  MDM [facility]. Stable for transfer   Richardean Canalavid H Yao, MD 06/16/14 [id number]

## 2014-06-16 NOTE — Progress Notes (Signed)
The patient was provided personal hygiene products and she took a shower. She was also, provided a ham sandwich with cheese and soda due to not wanting to eat the spaghetti that was served for lunch.

## 2014-06-16 NOTE — ED Notes (Signed)
Pt resting at present, will monitor for safety. 

## 2014-06-16 NOTE — Tx Team (Signed)
Initial Interdisciplinary Treatment Plan   PATIENT STRESSORS: Financial difficulties Health problems Medication change or noncompliance Substance abuse   PROBLEM LIST: Problem List/Patient Goals Date to be addressed Date deferred Reason deferred Estimated date of resolution  ETOH 06/16/14     Depression 06/16/14     SA (Crack) 06/16/14                                          DISCHARGE CRITERIA:  Improved stabilization in mood, thinking, and/or behavior Verbal commitment to aftercare and medication compliance  PRELIMINARY DISCHARGE PLAN: Attend aftercare/continuing care group Outpatient therapy  PATIENT/FAMIILY INVOLVEMENT: This treatment plan has been presented to and reviewed with the patient, Talbot Grumblingarolyn R Borcherding.  The patient and family have been given the opportunity to ask questions and make suggestions.  Jacques Navyhillips, Devery Odwyer A 06/16/2014, 10:38 PM

## 2014-06-17 ENCOUNTER — Encounter (HOSPITAL_COMMUNITY): Payer: Self-pay | Admitting: Psychiatry

## 2014-06-17 DIAGNOSIS — F323 Major depressive disorder, single episode, severe with psychotic features: Secondary | ICD-10-CM | POA: Diagnosis present

## 2014-06-17 DIAGNOSIS — R45851 Suicidal ideations: Secondary | ICD-10-CM

## 2014-06-17 DIAGNOSIS — F1994 Other psychoactive substance use, unspecified with psychoactive substance-induced mood disorder: Secondary | ICD-10-CM

## 2014-06-17 DIAGNOSIS — F1020 Alcohol dependence, uncomplicated: Secondary | ICD-10-CM | POA: Diagnosis present

## 2014-06-17 LAB — LIPID PANEL
Cholesterol: 105 mg/dL (ref 0–200)
HDL: 44 mg/dL (ref >39)
LDL Cholesterol: 46 mg/dL (ref 0–99)
TRIGLYCERIDES: 73 mg/dL (ref <150)
Total CHOL/HDL Ratio: 2.4 RATIO
VLDL: 15 mg/dL (ref 0–40)

## 2014-06-17 LAB — MAGNESIUM: Magnesium: 1.8 mg/dL (ref 1.5–2.5)

## 2014-06-17 LAB — HEMOGLOBIN A1C
HEMOGLOBIN A1C: 4.9 % (ref <5.7)
Mean Plasma Glucose: 94 mg/dL (ref <117)

## 2014-06-17 MED ORDER — ENSURE COMPLETE PO LIQD
237.0000 mL | Freq: Three times a day (TID) | ORAL | Status: DC
  Administered 2014-06-17 – 2014-06-20 (×10): 237 mL via ORAL

## 2014-06-17 NOTE — Clinical Social Work Note (Signed)
CSW attempted to complete PSA, patient asleep in room.   Samuella BruinKristin Adore Kithcart, MSW, [employer]CSWA Clinical Social Worker [facility] [phone]

## 2014-06-17 NOTE — Progress Notes (Signed)
D: On initial approach, presents sedated and confused to time.  Writer attempted to awake pt at 8 am to administered scheduled meds. Pt did not awaken to take meds. Scheduled 8 am ativan was not given and daily scheduled meds were administered when pt awaken for lunch. Pt A & O x 4 at lunch time.  Pt continues to feel weak and unsteady. Pt has a wheelchair at her bedside for assistance. Pt verbalized understanding of using the wheelchair and location of call light. Pt denies SI at this time. Pt reports feeling depressed d/t relapsing on drugs.  A: Medications administered as ordered per MD. Verbal support given. Pt encouraged to attend groups. 15 minute checks performed for safety. Wheelchair at pt bedside. Pt given gatorade for hydration.  R: Pt safety maintained at this time. Pt receptive to treatment.
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
## 2014-06-17 NOTE — Clinical Social Work Note (Signed)
CSW attempted to complete PSA with patient, patient in bed asleep. She stated that she felt "drunk" due to her medications and would prefer to complete PSA at later time.   Samuella BruinKristin Zahmir Lalla, MSW, [employer] Clinical Social Worker [facility] [phone]

## 2014-06-17 NOTE — [facility] Suicide Risk Assessment (Signed)
Suicide Risk Assessment  Admission Assessment     Nursing information obtained from:    Demographic factors:    Current Mental Status:    Loss Factors:    Historical Factors:    Risk Reduction Factors:    Total Time spent with patient: 45 minutes  CLINICAL FACTORS:   Depression:   Comorbid alcohol abuse/dependence Alcohol/Substance Abuse/Dependencies  COGNITIVE FEATURES THAT CONTRIBUTE TO RISK:  Closed-mindedness Polarized thinking Thought constriction (tunnel vision)    SUICIDE RISK:   Moderate:  Frequent suicidal ideation with limited intensity, and duration, some specificity in terms of plans, no associated intent, good self-control, limited dysphoria/symptomatology, some risk factors present, and identifiable protective factors, including available and accessible social support.  PLAN OF CARE: Supportive approach/coping skills/relapse prevention                               Ativan detox protocol                               Reassess and address the co morbidities                               Identify residential treatment options  I certify that inpatient services furnished can reasonably be expected to improve the patient's condition.  Foy Mungia A 06/17/2014, 2:27 PM
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
## 2014-06-17 NOTE — Progress Notes (Signed)
Patient ID: Dana GrumblingCarolyn R Mcintosh, female   DOB: 11/27/1950, 63 y.o.   MRN: [id number] D: Client is visible on the unit, reports "I been sleeping and eating." Client interacts appropriately with peers and staff. Client reports motivation to get clean and stay clean "When I was using I didn't have a place for my children and grandchildren to come and visit, or when they would come I would hide because I was ashamed" "I want to be the kind of mother and grandmother they can be proud of" Client interested in long term rehab. Client also complained of constipation with medications, requested laxative at bed time.  A: Writer introduced self to client, provide emotional support, encouraged client to move forward with plans for detox. Writer reviewed medication, administered milk of magnesium for constipation, also encouraged client to increase water intake. Writer encouraged client to use WC when ambulating. Staff will monitor q3315min for safety. R: Client is safe on the unit, attended group. Client agreed to use walker.

## 2014-06-17 NOTE — [facility] Group Notes (Signed)
[facility] LCSW Group Therapy 06/17/2014  1:15 PM   Type of Therapy: Group Therapy  Participation Level: Did Not Attend. Patient observed sleeping in room.   Samuella BruinKristin Daundre Biel, MSW, [employer]CSWA Clinical Social Worker [facility] [phone]

## 2014-06-18 MED ORDER — POTASSIUM CHLORIDE CRYS ER 10 MEQ PO TBCR
10.0000 meq | EXTENDED_RELEASE_TABLET | Freq: Every day | ORAL | Status: DC
  Administered 2014-06-18 – 2014-06-21 (×5): 10 meq via ORAL
  Filled 2014-06-18 (×8): qty 1

## 2014-06-18 NOTE — Progress Notes (Signed)
NUTRITION ASSESSMENT  Pt identified as at risk on the Malnutrition Screen Tool  INTERVENTION: 1. Educated patient on the importance of nutrition and encouraged intake of food and beverages. 2. Discussed weight goals. 3. Supplements: Continue Ensure Complete po TID, each supplement provides 350 kcal and 13 grams of protein, pt prefers chocolate   NUTRITION DIAGNOSIS: Unintentional weight loss related to sub-optimal intake as evidenced by pt report.   Goal: Pt to meet >/= 90% of their estimated nutrition needs.  Monitor:  PO intake  Assessment:  [name] who states that she is having a hard time. She is trying to get a place to live. States she makes enough money to afford a place, but her drinking and her use of cocaine depletes most of her funds. Drinking a pint a day. States since she was 16. Admits to using $50 of cacaine, and marijuna every 2 weeks.  Pt reports losing 20-30 lbs x 3 months PTA. UBW of 160 lb. Pt states that she has had no appetite during this time.   Per H&P, pt drinks a pint of alcohol a day. Hx of cocaine and marijuana use.  Discussed with pt the importance of eating 3 meals a day with snacks, emphasizing protein consumption. Discussed the importance of good nutrition for mental health and aiding in recovery.   Pt presents with noticeable signs of wasting in face and clavicle regions.  Pt currently receiving Ensure supplements, pt is drinking them. She states she prefers the chocolate flavor.  Labs reviewed: Low K, BUN  Height: Ht Readings from Last 1 Encounters:  06/16/14 5' 5.75" (1.67 m)    Weight: Wt Readings from Last 1 Encounters:  06/16/14 141 lb (63.957 kg)    Weight Hx: Wt Readings from Last 10 Encounters:  06/16/14 141 lb (63.957 kg)  04/01/14 139 lb (63.05 kg)  02/14/14 145 lb (65.772 kg)  07/29/12 150 lb (68.04 kg)    BMI:  Body mass index is 22.93 kg/(m^2). Pt meets criteria for normal range based on current  BMI.  Estimated Nutritional Needs: Kcal: 30-35 kcal/kg Protein: > 1 gram protein/kg Fluid: 1 ml/kcal  Diet Order: General Pt is also offered choice of unit snacks mid-morning and mid-afternoon.  Pt is eating as desired.   Lab results and medications reviewed.   Dana FrancoLindsey Dalylah Ramey, MS, RD, LDN Pager: [phone] After Hours Pager: [phone]

## 2014-06-18 NOTE — [facility] Group Notes (Signed)
Adult Psychoeducational Group Note  Date:  06/18/2014 Time:  10:31 PM  Group Topic/Focus:  AA Meeting  Participation Level:  Minimal  Participation Quality:  Drowsy  Affect:  Flat  Cognitive:  Lacking  Insight: Limited  Engagement in Group:  Lacking  Modes of Intervention:  Discussion and Education  Additional Comments:  Dana JonesCarolyn was drowsy during group but she did explain that she was tired of going through what she's been dealing with because of her addiction.  Dana RancherLindsay, Dana Mcintosh A 06/18/2014, 10:31 PM

## 2014-06-18 NOTE — Clinical Social Work Note (Signed)
[facility], patient has been accepted to facility on Tuesday 10/27 at 8 am. MD updated.  Samuella BruinKristin Brand Siever, MSW, [employer] Clinical Social Worker [facility] [phone]

## 2014-06-18 NOTE — Clinical Social Work Note (Signed)
CSW attempted to complete PSA, patient asleep and difficult to awaken. Will complete PSA at a later time.  Dana BruinKristin Forrestine Lecrone, MSW, [employer] Clinical Social Worker [facility] [phone]

## 2014-06-18 NOTE — Progress Notes (Signed)
[facility] MD Progress Note  06/18/2014 2:36 PM Dana Mcintosh  MRN:  [id number] Subjective:  Dana Mcintosh is still having a hard time. She is trying to get her life back together and admits she is not going to be able to make it if she goes immediately back out. States she needs a residential treatment program. She did not go last time and she relapsed almost immediately. 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  HENT: Negative.   Eyes: Negative.   Respiratory: Negative.   Cardiovascular: Negative.   Gastrointestinal: Negative.   Genitourinary: Negative.   Musculoskeletal: Positive for joint pain.  Skin: Negative.   Neurological: Positive for weakness.  Endo/Heme/Allergies: Negative.   Psychiatric/Behavioral: Positive for depression and substance abuse.    Blood pressure 127/92, pulse 79, temperature 98 F (36.7 C), temperature source Oral, resp. rate 16, height 5' 5.75" (1.67 m), weight 63.957 kg (141 lb), last menstrual period 07/27/1997.Body mass index is 22.93 kg/(m^2).  General Appearance: Fairly Groomed  Patent attorney::  Fair  Speech:  Clear and Coherent  Volume:  Normal  Mood:  Anxious and Depressed  Affect:  anxious, worried  Thought Process:  Coherent and Goal Directed  Orientation:  Full (Time, Place, and Person)  Thought Content:  deals with events worries concerns  Suicidal Thoughts:  No  Homicidal Thoughts:  No  Memory:  Immediate;   Fair Recent;    Fair Remote;   Fair  Judgement:  Fair  Insight:  Present  Psychomotor Activity:  Decreased  Concentration:  Fair  Recall:  [employer] of Knowledge:NA  Language: Fair  Akathisia:  No  Handed:    AIMS (if indicated):     Assets:  Desire for Improvement  Sleep:  Number of Hours: 6.25   Musculoskeletal: Strength & Muscle Tone: Decreased Gait & Station:  Patient leans: N/A  Current Medications: Current Facility-Administered Medications  Medication Dose Route Frequency Provider Last Rate Last Dose  . alum & mag hydroxide-simeth (MAALOX/MYLANTA) 200-200-20 MG/5ML suspension 30 mL  30 mL Oral Q4H PRN Kerry Hough, PA-C      . feeding supplement (ENSURE COMPLETE) (ENSURE COMPLETE) liquid 237 mL  237 mL Oral TID BM Tilda Franco, RD   237 mL at 06/18/14 1000  . gabapentin (NEURONTIN) capsule 300 mg  300 mg Oral BID Kerry Hough, PA-C   300 mg at 06/18/14 1219  . hydrOXYzine (ATARAX/VISTARIL) tablet 25 mg  25 mg Oral Q6H PRN Kerry Hough, PA-C      . loperamide (IMODIUM) capsule 2-4 mg  2-4 mg Oral PRN Kerry Hough, PA-C      . LORazepam (ATIVAN) tablet 1 mg  1 mg Oral Q6H PRN Kerry Hough, PA-C      . LORazepam (ATIVAN) tablet 1 mg  1 mg Oral TID Kerry Hough, PA-C       Followed by  . [START ON 06/19/2014] LORazepam (ATIVAN) tablet 1 mg  1 mg Oral BID Kerry Hough, PA-C  Followed by  . [START ON 06/21/2014] LORazepam (ATIVAN) tablet 1 mg  1 mg Oral Daily Spencer E Simon, PA-C      . magnesium hydroxide (MILK OF MAGNESIA) suspension 30 mL  30 mL Oral Daily PRN Kerry HoughSpencer E Simon, PA-C   30 mL at 06/17/14 2133  . multivitamin with minerals tablet 1 tablet  1 tablet Oral Daily Kerry HoughSpencer E Simon, PA-C   1 tablet at 06/17/14 1212  . ondansetron (ZOFRAN-ODT) disintegrating tablet 4 mg  4 mg Oral Q6H PRN Kerry HoughSpencer E Simon, PA-C      . potassium chloride SA (K-DUR,KLOR-CON) CR tablet 20 mEq  20 mEq Oral BID Kerry HoughSpencer E Simon, PA-C   20 mEq at 06/17/14 1805  . thiamine (B-1)  injection 100 mg  100 mg Intramuscular Once IntelSpencer E Simon, PA-C      . thiamine (VITAMIN B-1) tablet 100 mg  100 mg Oral Daily Kerry HoughSpencer E Simon, PA-C   100 mg at 06/17/14 1212  . traZODone (DESYREL) tablet 50 mg  50 mg Oral QHS,MR X 1 Kerry HoughSpencer E Simon, PA-C   50 mg at 06/17/14 2118    Lab Results:  Results for orders placed during the hospital encounter of 06/16/14 (from the past 48 hour(s))  MAGNESIUM     Status: None   Collection Time    06/17/14  6:40 AM      Result Value Ref Range   Magnesium 1.8  1.5 - 2.5 mg/dL   Comment: [facility]  LIPID PANEL     Status: None   Collection Time    06/17/14  6:40 AM      Result Value Ref Range   Cholesterol 105  0 - 200 mg/dL   Triglycerides 73  <244<150 mg/dL   HDL 44  >01>39 mg/dL   Total CHOL/HDL Ratio 2.4     VLDL 15  0 - 40 mg/dL   LDL Cholesterol 46  0 - 99 mg/dL   Comment:            Total Cholesterol/HDL:CHD Risk     Coronary Heart Disease Risk Table                         Men   Women      1/2 Average Risk   3.4   3.3      Average Risk       5.0   4.4      2 X Average Risk   9.6   7.1      3 X Average Risk  23.4   11.0                Use the calculated Patient Ratio     above and the CHD Risk Table     to determine the patient's CHD Risk.                ATP III CLASSIFICATION (LDL):      <100     mg/dL   Optimal      [id number]  mg/dL   Near or Above                        Optimal      130-159  mg/dL   Borderline      [phone]  mg/dL   High      >474>190     mg/dL   Very  High     Performed [facility]  HEMOGLOBIN A1C     Status: None   Collection Time    06/17/14  6:40 AM      Result Value Ref Range   Hemoglobin A1C 4.9  <5.7 %   Comment: (NOTE)                                                                               According to the ADA Clinical Practice Recommendations for 2011, when     HbA1c is used as a screening test:      >=6.5%   Diagnostic of Diabetes Mellitus                (if abnormal result is confirmed)     5.7-6.4%   Increased risk of developing Diabetes Mellitus     References:Diagnosis and Classification of Diabetes Mellitus,Diabetes     Care,2011,34(Suppl 1):S62-S69 and Standards of Medical Care in             Diabetes - 2011,Diabetes Care,2011,34 (Suppl 1):S11-S61.   Mean Plasma Glucose 94  <117 mg/dL   Comment: Performed at [facility]    Physical Findings: AIMS: Facial and Oral Movements Muscles of Facial Expression: None, normal Lips and Perioral Area: None, normal Jaw: None, normal Tongue: None, normal,Extremity Movements Upper (arms, wrists, hands, fingers): None, normal Lower (legs, knees, ankles, toes): None, normal, Trunk Movements Neck, shoulders, hips: None, normal, Overall Severity Severity of abnormal movements (highest score from questions above): None, normal Incapacitation due to abnormal movements: None, normal Patient's awareness of abnormal movements (rate only patient's report): No Awareness, Dental Status Current problems with teeth and/or dentures?: Yes (no teeth) Does patient usually wear dentures?: No  CIWA:  CIWA-Ar Total: 2 COWS:  COWS Total Score: 5  Treatment Plan Summary: Daily contact with patient to assess and evaluate symptoms and progress in treatment Medication management  Plan: Supportive approach/coping skills/relapse prevention           Continue detox           Reassess and address the co morbidities           Explore placement options Medical Decision Making Problem Points:  Review of psycho-social stressors (1) Data Points:  Review of medication regiment & side effects (2)  I certify that inpatient services furnished can reasonably be expected to improve the patient's condition.   Emillia Weatherly A 06/18/2014, 2:36 PM

## 2014-06-18 NOTE — Tx Team (Signed)
Interdisciplinary Treatment Plan Update (Adult) Date: 06/18/2014   Time Reviewed: 9:30 AM  Progress in Treatment: Attending groups: No Participating in groups: Minimal Taking medication as prescribed: Yes Tolerating medication: Yes Family/Significant other contact made: CSW continuing to assess Patient understands diagnosis: Yes Discussing patient identified problems/goals with staff: Yes Medical problems stabilized or resolved: Yes Denies suicidal/homicidal ideation: Treatment team continuing to assess Issues/concerns per patient self-inventory: Yes Other:  New problem(s) identified: N/A  Discharge Plan or Barriers: CSW continuing to assess. Patient requesting residential treatment.  Reason for Continuation of Hospitalization:  Depression Anxiety Medication Stabilization   Comments: N/A  Estimated length of stay: 3-5 days  For review of initial/current patient goals, please see plan of care. Dana Mcintosh is an 63 y.o. female. She has a hx of substance abuse and depression. Pt presents wanting detox from ETOH, marijuana, and crack. Last drink, 1 pint of gin x 5 hours ago. No hx of seizures, black outs, and/or DT's. She currently reports cold chills and fatigue. She reports 7 yrs of sobriety in the past. Pt is expressing hopelessness and is concerned about what may happen, if she doesn't change. She admits to suicidal ideations earlier this morning. She continues express passive suicidal ideations. No suicidal plan and /or intent. Patient reports a hx of multiple suicide attempts. Sts that in the past she tried to drown self and OD. Triggers for current depression are related to her relapse of substance use 2-3 months ago. Pt reports loss of interest in usual pleasures and increased irritability. She also reports increased anxiety. Appetite has decreased with 40-50 pounds of weight loss in the past 2-3 months. Sleep is poor. Pt reports multiple prior mental health/substance  hospitalizations. [facility]. No mental health/substance abuse providers reported.    Attendees: Patient:    Family:    Physician: Dr. Jama Flavorsobos; Dr. Dub MikesLugo 06/18/2014 9:30 AM  Nursing: Shelda JakesPatty Duke; Lamount Crankerhris Judge; Robbie LouisVivian Kent, RN 06/18/2014 9:30 AM  Clinical Social Worker: Belenda CruiseKristin Tylisa Alcivar,  LCSWA 06/18/2014 9:30 AM  Other: Juline PatchQuylle Hodnett, LCSW 06/18/2014 9:30 AM  Other: Leisa LenzValerie Enoch, Vesta Mixer[facility]arch Liaison 06/18/2014 9:30 AM  Other: Tomasita Morrowelora [name], P4CC  06/18/2014 9:30 AM  Other: Santa GeneraAnne Cunningham, LCSW 06/18/2014 9:30 AM  Other:    Other:    Other:    Other:    Other:      Scribe for Treatment Team:  Samuella BruinKristin Nakota Elsen, MSW, [employer]CSWA [phone]

## 2014-06-18 NOTE — Clinical Social Work Note (Signed)
CSW attempted to complete PSA with patient, patient asleep and states that she does not feel like meeting at this time but states that she is interested in residential treatment. Patient provided verbal consent for CSW to make [facility] and [facility].   Samuella BruinKristin Azka Steger, MSW, [facility] Clinical Social Worker [facility] [phone]

## 2014-06-18 NOTE — Progress Notes (Signed)
[facility] Group Notes:  (Nursing/MHT/Case Management/Adjunct)  Date:  06/18/2014  Time:  1:05 PM  Type of Therapy:  Therapeutic Activity  Participation Level:  Did not attend. Pt was in bed asleep.   Biviana Saddler C 06/18/2014, 1:05 PM 

## 2014-06-18 NOTE — [facility] Group Notes (Signed)
[facility] LCSW Group Therapy  Feelings Around Relapse 1:15 -2:30        06/18/2014   Type of Therapy:  Group Therapy  Participation Level:  Patient did not attend group - patient with therapist.      Wynn BankerHodnett, Christofer Shen Hairston 06/18/2014

## 2014-06-18 NOTE — Clinical Social Work Note (Signed)
CSW contacted [facility] to initiate referral process and [facility].  Samuella BruinKristin Traeger Sultana, MSW, [employer]LCSWA Clinical Social Worker [facility] [phone]

## 2014-06-18 NOTE — Evaluation (Signed)
Physical Therapy Evaluation Patient Details Name: Dana GrumblingCarolyn R Mcintosh MRN: [id number] DOB: 11/19/1950 Today's Date: 06/18/2014   History of Present Illness  Dana Mcintosh is an 63 y.o. female [facility] requesting to de-tox; She has a hx of substance abuse and depression; she reports to PT this date that she broke her R foot 2 years ago and has been falling since then;   Clinical Impression  Pt will benefit from PT to address gait, balance, safety with RW; She did well with simulation of RW using w/c today; she wants to rest so did not amb with RW, [facility] and left in pt room for her use; Will check pt later next week as schedule permits; Pt would benefit from incr activity/using RW more with staff supervision initally;    Follow Up Recommendations Home health PT;No PT follow up    Equipment Recommendations  Rolling walker with 5" wheels    Recommendations for Other Services       Precautions / Restrictions Precautions Precautions: Fall      Mobility  Bed Mobility Overal bed mobility: Independent                Transfers Overall transfer level: Needs assistance Equipment used: None Transfers: Sit to/from Stand Sit to Stand: Min guard         General transfer comment: pt stands with knees and hips flexed;  corrects slightly with tactile cues  Ambulation/Gait Ambulation/Gait assistance: Min guard Ambulation Distance (Feet): 60 Feet Assistive device:  (simulated RW by pushing w/c) Gait Pattern/deviations: Step-through pattern;Trunk flexed   Gait velocity interpretation: Below normal speed for age/gender General Gait Details: cues for posture and step length  Stairs            Wheelchair Mobility    Modified Rankin (Stroke Patients Only)       Balance Overall balance assessment: Needs assistance;History of Falls (pt reports)   Sitting balance-Leahy Scale: Good       Standing balance-Leahy Scale: Fair Standing balance  comment: pt demonstrates knee buckling bil when unilateral stance attempted however she was able to bend forward and pick up object from floor later in session; she was also able to lift back of w/c and reposition it without PT assist                             Pertinent Vitals/Pain Pain Assessment: No/denies pain    Home Living Family/patient expects to be discharged to:: Private residence Living Arrangements: Children                    Prior Function                 Hand Dominance        Extremity/Trunk Assessment               Lower Extremity Assessment:  (strength tests 4+/5  to 5/5 in LEs bil; see balance and  gai tsection for further details)         Communication   Communication: No difficulties  Cognition Arousal/Alertness: Lethargic (pt wanting to sleep) Behavior During Therapy: WFL for tasks assessed/performed Overall Cognitive Status: Within Functional Limits for tasks assessed                      General Comments      Exercises  Assessment/Plan    PT Assessment Patient needs continued PT services  PT Diagnosis Difficulty walking   PT Problem List Decreased activity tolerance;Decreased balance  PT Treatment Interventions DME instruction;Gait training;Balance training   PT Goals (Current goals can be found in the Care Plan section) Acute Rehab PT Goals Patient Stated Goal: does not state, wants to rest PT Goal Formulation: With patient Time For Goal Achievement: 06/25/14 Potential to Achieve Goals: Good    Frequency Min 1X/week   Barriers to discharge        Co-evaluation               End of Session   Activity Tolerance: Patient limited by lethargy Patient left: in bed      Functional Assessment Tool Used: clinical judgement Functional Limitation: Mobility: Walking and moving around Mobility: Walking and Moving Around Current Status ([id number]([id number]): At least 1 percent but less than 20  percent impaired, limited or restricted Mobility: Walking and Moving Around Goal Status [phone](G8979): At least 1 percent but less than 20 percent impaired, limited or restricted    Time: [id number] PT Time Calculation (min): 17 min   Charges:   PT Evaluation $Initial PT Evaluation Tier I: 1 Procedure PT Treatments $Gait Training: 8-22 mins   PT G Codes:   Functional Assessment Tool Used: clinical judgement Functional Limitation: Mobility: Walking and moving around   Meadowdaleara Andrews Tener, [place]PT Pager: [phone] 06/18/2014  Drucilla ChaletWILLIAMS,Heily Carlucci 06/18/2014, 2:02 PM

## 2014-06-18 NOTE — [facility] Group Notes (Signed)
[facility] LCSW Group Therapy 06/18/2014  1:15 PM   Type of Therapy: Group Therapy  Participation Level: Did Not Attend. Patient sleeping.   Samuella BruinKristin Tailynn Armetta, MSW, [employer]CSWA Clinical Social Worker [facility] [phone]

## 2014-06-18 NOTE — Progress Notes (Signed)
D Pt has stayed in her bed the ENTIRE day...sleeping heavily in between med passes.She does not complete her self inventory but verbally contracts with this nurse for safety. A She refused her evening doses of ativan, saying " it just makes me sleep too much". She deneis any withdrawal symptom and she is educated by this nurse aboput what withd4rawll symptoms look like. She states pos understanding and this nurse encourages her to seek hlep if these symptoms become present. R Safety maintaiend.

## 2014-06-18 NOTE — Progress Notes (Signed)
Patient ID: Dana Mcintosh, female   DOB: 02/11/1951, 63 y.o.   MRN: [id number] D: client visible on the unit, in dayroom watching TV, interacts appropriately with peers and staff. Client reports "medication was too strong, I been over sleeping" Client made reference to Ativan, reports she doesn't think she needs anymore of that. A: Writer reviewed medications, administered as ordered. Writer reviewed labs noted Potassium level of 3.3, notified Jamison L. NP, received order for Potassium 10 mEq qd. Staff will monitor q5915min for safety. R: Client is safe on the unit, attended group.

## 2014-06-18 NOTE — Clinical Social Work Note (Signed)
[facility].  Samuella BruinKristin India Jolin, MSW, [employer]CSWA Clinical Social Worker [facility] [phone]

## 2014-06-19 NOTE — Plan of Care (Signed)
Problem: Ineffective individual coping Goal: STG-Increase in ability to manage activities of daily living Outcome: Progressing Client reports up for meals today, also attended groups, appropriately dressed.

## 2014-06-19 NOTE — Progress Notes (Addendum)
Dana Eber JonesCarolyn is seen OOB UAL on the 300 hall this morning. She makes good eye contact. She is pleasant and cooperative and she says " I feel better today".    A She takes her meds as scheduled and she is attending her  Groups today as planned.   R Safety is in place. Pt completed his daily assessment and on it she writes she deneis SI within the past 24 hrs, and she rated her depression, anxiety and hopelessness "5/8/7".

## 2014-06-19 NOTE — Progress Notes (Signed)
[facility]  06/19/2014 12:03 PM Dana Mcintosh  MRN:  [id number]   Subjective:    Patient  in bed at 1200.  Awakens easily to verbal stimuli  Rates Depression 6/10  Anxiety 0/10   She is calm and cooperative and openly shares her feelings of guilt  eegarding her current  Situation, "I'm too old for this, I should know better"  "I know I need to stay away from the wrong perople"    I was "in the hood doing all kinds of drugs all day long"  States she needs a residential treatment program on discharge to be successful  Reports that going to church and pray are major coping strategies for her.    Diagnosis:   DSM5: Substance/Addictive Disorders:  Alcohol Related Disorder - Severe (303.90), Cocaine Use Disorder moderate, Cannabis use disorder moderate Depressive Disorders:  Major Depressive Disorder - Severe (296.23) Total Time spent with patient: 30 minutes  Axis I: Substance Induced Mood Disorder  ADL's:  Intact  Sleep: Poor  "didn't sleep last night"  Appetite:  Fair  Psychiatric Specialty Exam: Physical Exam  Constitutional: She is oriented to person, place, and time. She appears well-developed and well-nourished.  HENT:  Head: Normocephalic.  Neck: Normal range of motion.  Musculoskeletal: Normal range of motion.  Neurological: She is alert and oriented to person, place, and time.  Skin: Skin is warm and dry.    Review of Systems  Constitutional: Positive for malaise/fatigue.  HENT: Negative.   Eyes: Negative.   Respiratory: Negative.   Cardiovascular: Negative.   Gastrointestinal: Negative.   Genitourinary: Negative.   Musculoskeletal: Positive for joint pain.  Skin: Negative.   Neurological: Positive for weakness.  Endo/Heme/Allergies: Negative.   Psychiatric/Behavioral: Positive for depression and substance abuse.    Blood pressure 138/83, pulse 62, temperature 98.2 F (36.8 C), temperature source Oral, resp. rate 17, height 5' 5.75" (1.67 m),  weight 63.957 kg (141 lb), last menstrual period 07/27/1997.Body mass index is 22.93 kg/(m^2).  General Appearance: Fairly Groomed  Patent attorneyye Contact::  Fair  Speech: slow  Volume: decreased  Mood:  Anxious and Depressed, guilty "I'm too old for this"  Affect:  anxious, worried  Thought Process:  Coherent and Goal Directed  Orientation:  Full (Time, Place, and Person)  Thought Content:  deals with events worries concerns  Suicidal Thoughts:  No  Homicidal Thoughts:  No  Memory:  Immediate;   Fair Recent;   Fair Remote;   Fair  Judgement:  Fair  Insight:  Present  Psychomotor Activity:  Decreased  Concentration:  Fair  Recall:  FiservFair  Fund of Knowledge:NA  Language: Fair  Akathisia:  No  Handed:    AIMS (if indicated):     Assets:  Desire for Improvement  Sleep:  Number of Hours: 6.75   Musculoskeletal: Strength & Muscle Tone: Decreased Gait & Station:  Patient leans: N/A  Current Medications: Current Facility-Administered Medications  Medication Dose Route Frequency Provider Last Rate Last Dose  . alum & mag hydroxide-simeth (MAALOX/MYLANTA) 200-200-20 MG/5ML suspension 30 mL  30 mL Oral Q4H PRN Kerry HoughSpencer E Simon, PA-C      . feeding supplement (ENSURE COMPLETE) (ENSURE COMPLETE) liquid 237 mL  237 mL Oral TID BM Tilda FrancoLindsey Baker, RD   237 mL at 06/18/14 1951  . gabapentin (NEURONTIN) capsule 300 mg  300 mg Oral BID Kerry HoughSpencer E Simon, PA-C   300 mg at 06/19/14 1200  . hydrOXYzine (ATARAX/VISTARIL) tablet 25 mg  25  mg Oral Q6H PRN Kerry HoughSpencer E Simon, PA-C      . loperamide (IMODIUM) capsule 2-4 mg  2-4 mg Oral PRN Kerry HoughSpencer E Simon, PA-C      . LORazepam (ATIVAN) tablet 1 mg  1 mg Oral Q6H PRN Kerry HoughSpencer E Simon, PA-C      . LORazepam (ATIVAN) tablet 1 mg  1 mg Oral BID Kerry HoughSpencer E Simon, PA-C       Followed by  . [START ON 06/21/2014] LORazepam (ATIVAN) tablet 1 mg  1 mg Oral Daily Spencer E Simon, PA-C      . magnesium hydroxide (MILK OF MAGNESIA) suspension 30 mL  30 mL Oral Daily PRN Kerry HoughSpencer  E Simon, PA-C   30 mL at 06/17/14 2133  . multivitamin with minerals tablet 1 tablet  1 tablet Oral Daily Kerry HoughSpencer E Simon, PA-C   1 tablet at 06/19/14 1200  . ondansetron (ZOFRAN-ODT) disintegrating tablet 4 mg  4 mg Oral Q6H PRN Kerry HoughSpencer E Simon, PA-C      . potassium chloride (K-DUR,KLOR-CON) CR tablet 10 mEq  10 mEq Oral Daily Nanine MeansJamison Lord, NP   10 mEq at 06/19/14 1200  . thiamine (B-1) injection 100 mg  100 mg Intramuscular Once IntelSpencer E Simon, PA-C      . thiamine (VITAMIN B-1) tablet 100 mg  100 mg Oral Daily Kerry HoughSpencer E Simon, PA-C   100 mg at 06/19/14 1200  . traZODone (DESYREL) tablet 50 mg  50 mg Oral QHS,MR X 1 Kerry HoughSpencer E Simon, PA-C   50 mg at 06/18/14 2126    Lab Results:  No results found for this or any previous visit (from the past 48 hour(s)).  Physical Findings: AIMS: Facial and Oral Movements Muscles of Facial Expression: None, normal Lips and Perioral Area: None, normal Jaw: None, normal Tongue: None, normal,Extremity Movements Upper (arms, wrists, hands, fingers): None, normal Lower (legs, knees, ankles, toes): None, normal, Trunk Movements Neck, shoulders, hips: None, normal, Overall Severity Severity of abnormal movements (highest score from questions above): None, normal Incapacitation due to abnormal movements: None, normal Patient's awareness of abnormal movements (rate only patient's report): No Awareness, Dental Status Current problems with teeth and/or dentures?: Yes (no teeth) Does patient usually wear dentures?: No  CIWA:  CIWA-Ar Total: 0 COWS:  COWS Total Score: 5  Treatment Plan Summary: Daily contact with patient to assess and evaluate symptoms and progress in treatment Medication management  Plan: Supportive approach/coping skills/relapse prevention           Continue detox           Reassess and address the co morbidities           Explore placement options.  She is hopeful after further  rehab she may be able to [place]                ** Discussed her assests and coping skills that were helpful in the past.  Expresses her love for her                   grand-children       Medical Decision Making Problem Points:  Review of psycho-social stressors (1) Data Points:  Review of medication regiment & side effects (2) -CMET in the am  I certify that inpatient services furnished can reasonably be expected to improve the patient's condition.  Lorinda Creed  PMHNP 06/19/2014, 12:03 PM I agreed with findings and treatment plan of this patient

## 2014-06-19 NOTE — [facility] Group Notes (Signed)
[facility] Group Notes:  (Nursing/MHT/Case Management/Adjunct)  Date:  06/19/2014  Time:  1:55 PM  Type of Therapy:  Psychoeducational Skills  Participation Level:  Did Not Attend  Dana Mcintosh 06/19/2014, 1:55 PM 

## 2014-06-19 NOTE — [facility] Group Notes (Signed)
[facility] Group Notes: (Clinical Social Work)   06/19/2014      Type of Therapy:  Group Therapy   Participation Level:  Did Not Attend - sleeping    Ambrose MantleMareida Grossman-Orr, LCSW 06/19/2014, 1:35 PM
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
## 2014-06-19 NOTE — Progress Notes (Signed)
Patient did attend the evening speaker AA meeting.  

## 2014-06-20 LAB — COMPREHENSIVE METABOLIC PANEL
ALK PHOS: 75 U/L (ref 39–117)
ALT: 168 U/L — AB (ref 0–35)
AST: 269 U/L — ABNORMAL HIGH (ref 0–37)
Albumin: 2.9 g/dL — ABNORMAL LOW (ref 3.5–5.2)
Anion gap: 9 (ref 5–15)
BUN: 11 mg/dL (ref 6–23)
CALCIUM: 9.4 mg/dL (ref 8.4–10.5)
CO2: 25 mEq/L (ref 19–32)
Chloride: 105 mEq/L (ref 96–112)
Creatinine, Ser: 0.84 mg/dL (ref 0.50–1.10)
GFR calc Af Amer: 85 mL/min — ABNORMAL LOW (ref >90)
GFR calc non Af Amer: 74 mL/min — ABNORMAL LOW (ref >90)
Glucose, Bld: 101 mg/dL — ABNORMAL HIGH (ref 70–99)
POTASSIUM: 4.2 meq/L (ref 3.7–5.3)
SODIUM: 139 meq/L (ref 137–147)
TOTAL PROTEIN: 7.3 g/dL (ref 6.0–8.3)
Total Bilirubin: 0.3 mg/dL (ref 0.3–1.2)

## 2014-06-20 MED ORDER — HYDROXYZINE HCL 50 MG PO TABS
50.0000 mg | ORAL_TABLET | Freq: Once | ORAL | Status: AC
  Administered 2014-06-20: 50 mg via ORAL
  Filled 2014-06-20 (×2): qty 1

## 2014-06-20 MED ORDER — LISINOPRIL 5 MG PO TABS
5.0000 mg | ORAL_TABLET | Freq: Every day | ORAL | Status: DC
  Administered 2014-06-20: 5 mg via ORAL
  Filled 2014-06-20 (×3): qty 1
  Filled 2014-06-20: qty 14
  Filled 2014-06-20: qty 1
  Filled 2014-06-20: qty 14

## 2014-06-20 NOTE — Progress Notes (Signed)
Patient did attend the evening speaker AA meeting.  

## 2014-06-20 NOTE — Progress Notes (Signed)
D Dana Mcintosh cont to spend most of her time sleeping and isolating in her room. She does not complete her morning assessment but does say to this nurse. " I'm ok... I will not try to hurt myself". A She does not attend her groups and asks for permission to sleep, saying " I'm so tired". R Safety is in place andn poc cont.

## 2014-06-20 NOTE — Progress Notes (Signed)
Psychoeducational Group Note  Date:  06/20/2014 Time:  1500 Group Topic/Focus:  Making Healthy Choices:   The focus of this group is to help patients identify negative/unhealthy choices they were using prior to admission and identify positive/healthier coping strategies to replace them upon discharge.  Participation Level:  Pt did not attend  Participation Quality:  N/A  Affect:N/A   Cognitive:  Minimal  Insight:  Poor  Engagement in Group:  Minimal  Additional Comments:    Rich BraveDuke, Karanvir Balderston Lynn 6:28 PM. 06/20/2014

## 2014-06-20 NOTE — Progress Notes (Signed)
[facility] MD Progress Note  06/20/2014 12:11 PM Dana Mcintosh  MRN:  [id number]   Subjective:    Decided to get up and go to lunch. Sleep last night was poor, go to sleep but can't stay asleep   Reports she attended [place] meeting last evetning   "I want this more than anything"  Rates Depression 7/10  Anxiety 7/10      States she needs a residential treatment program on discharge to be successful, worries about getting this kind of treatment on discharge   Plans to talk with her son today about possibly staying with him after continued treatment.     Diagnosis:   DSM5: Substance/Addictive Disorders:  Alcohol Related Disorder - Severe (303.90), Cocaine Use Disorder moderate, Cannabis use disorder moderate Depressive Disorders:  Major Depressive Disorder - Severe (296.23) Total Time spent with patient: 30 minutes  Axis I: Substance Induced Mood Disorder  ADL's:  Intact  Sleep: Poor  "didn't sleep last night"  Appetite:  Fair  Psychiatric Specialty Exam: Physical Exam  Constitutional: She is oriented to person, place, and time. She appears well-developed and well-nourished.  HENT:  Head: Normocephalic.  Neck: Normal range of motion.  Musculoskeletal: Normal range of motion.  Neurological: She is alert and oriented to person, place, and time.  Skin: Skin is warm and dry.    Review of Systems  Constitutional: Positive for malaise/fatigue.  HENT: Negative.   Eyes: Negative.   Respiratory: Negative.   Cardiovascular: Negative.   Gastrointestinal: Negative.   Genitourinary: Negative.   Musculoskeletal: Positive for joint pain.  Skin: Negative.   Neurological: Positive for weakness.  Endo/Heme/Allergies: Negative.   Psychiatric/Behavioral: Positive for depression and substance abuse.    Blood pressure 135/84, pulse 61, temperature 98.2 F (36.8 C), temperature source Oral, resp. rate 16, height 5' 5.75" (1.67 m), weight 63.957 kg (141 lb), last menstrual period  07/27/1997.Body mass index is 22.93 kg/(m^2).  General Appearance: Fairly Groomed  Engineer, water::  Fair  Speech: slow  Volume: decreased  Mood:  Anxious and Depressed,  Feels guilty   Affect:  anxious, worried  Thought Process:  Coherent and Goal Directed  Orientation:  Full (Time, Place, and Person)  Thought Content:  deals with events worries concerns  Suicidal Thoughts:  No  Homicidal Thoughts:  No  Memory:  Immediate;   Fair Recent;   Fair Remote;   Fair  Judgement:  Fair  Insight:  Present  Psychomotor Activity:  Decreased  Concentration:  Fair  Recall:  [employer] of Knowledge:NA  Language: Fair  Akathisia:  No  Handed:    AIMS (if indicated):     Assets:  Desire for Improvement  Sleep:  Number of Hours: 6.25   Musculoskeletal: Strength & Muscle Tone: Decreased Gait & Station:  Patient leans: N/A  Current Medications: Current Facility-Administered Medications  Medication Dose Route Frequency Provider Last Rate Last Dose  . alum & mag hydroxide-simeth (MAALOX/MYLANTA) 200-200-20 MG/5ML suspension 30 mL  30 mL Oral Q4H PRN Laverle Hobby, PA-C      . feeding supplement (ENSURE COMPLETE) (ENSURE COMPLETE) liquid 237 mL  237 mL Oral TID BM Clayton Bibles, RD   237 mL at 06/19/14 2203  . gabapentin (NEURONTIN) capsule 300 mg  300 mg Oral BID Laverle Hobby, PA-C   300 mg at 06/20/14 0800  . LORazepam (ATIVAN) tablet 1 mg  1 mg Oral BID Laverle Hobby, PA-C   1 mg at 06/19/14 1823   Followed  by  . Derrill Memo ON 06/21/2014] LORazepam (ATIVAN) tablet 1 mg  1 mg Oral Daily Spencer E Simon, PA-C      . magnesium hydroxide (MILK OF MAGNESIA) suspension 30 mL  30 mL Oral Daily PRN Laverle Hobby, PA-C   30 mL at 06/17/14 2133  . multivitamin with minerals tablet 1 tablet  1 tablet Oral Daily Laverle Hobby, PA-C   1 tablet at 06/19/14 1203  . potassium chloride (K-DUR,KLOR-CON) CR tablet 10 mEq  10 mEq Oral Daily Waylan Boga, NP   10 mEq at 06/19/14 1203  . thiamine (B-1)  injection 100 mg  100 mg Intramuscular Once [employer], PA-C      . thiamine (VITAMIN B-1) tablet 100 mg  100 mg Oral Daily Laverle Hobby, PA-C   100 mg at 06/19/14 1203  . traZODone (DESYREL) tablet 50 mg  50 mg Oral QHS,MR X 1 Laverle Hobby, PA-C   50 mg at 06/19/14 2206    Lab Results:  Results for orders placed during the hospital encounter of 06/16/14 (from the past 48 hour(s))  COMPREHENSIVE METABOLIC PANEL     Status: Abnormal   Collection Time    06/20/14  6:43 AM      Result Value Ref Range   Sodium 139  137 - 147 mEq/L   Potassium 4.2  3.7 - 5.3 mEq/L   Chloride 105  96 - 112 mEq/L   CO2 25  19 - 32 mEq/L   Glucose, Bld 101 (*) 70 - 99 mg/dL   BUN 11  6 - 23 mg/dL   Creatinine, Ser 0.84  0.50 - 1.10 mg/dL   Calcium 9.4  8.4 - 10.5 mg/dL   Total Protein 7.3  6.0 - 8.3 g/dL   Albumin 2.9 (*) 3.5 - 5.2 g/dL   AST 269 (*) 0 - 37 U/L   ALT 168 (*) 0 - 35 U/L   Alkaline Phosphatase 75  39 - 117 U/L   Total Bilirubin 0.3  0.3 - 1.2 mg/dL   GFR calc non Af Amer 74 (*) >90 mL/min   GFR calc Af Amer 85 (*) >90 mL/min   Comment: (NOTE)     The eGFR has been calculated using the CKD EPI equation.     This calculation has not been validated in all clinical situations.     eGFR's persistently <90 mL/min signify possible Chronic Kidney     Disease.   Anion gap 9  5 - 15   Comment: Performed at [facility]    Physical Findings: AIMS: Facial and Oral Movements Muscles of Facial Expression: None, normal Lips and Perioral Area: None, normal Jaw: None, normal Tongue: None, normal,Extremity Movements Upper (arms, wrists, hands, fingers): None, normal Lower (legs, knees, ankles, toes): None, normal, Trunk Movements Neck, shoulders, hips: None, normal, Overall Severity Severity of abnormal movements (highest score from questions above): None, normal Incapacitation due to abnormal movements: None, normal Patient's awareness of abnormal movements (rate only  patient's report): No Awareness, Dental Status Current problems with teeth and/or dentures?: Yes (no teeth) Does patient usually wear dentures?: No  CIWA:  CIWA-Ar Total: 2 COWS:  COWS Total Score: 5  Treatment Plan Summary: Daily contact with patient to assess and evaluate symptoms and progress in treatment Medication management  Plan: Supportive approach/coping skills/relapse prevention           Continue detox           Reassess and address  the co-morbidities/discussed elevated Liver enzymes and affect of alcohol abuse on liver health           Explore placement options.  She is hopeful after inpatient  rehab she may be able to go live at her son's in               [place], [place]                ** Discussed her assests and coping skills that were helpful in the past.  Expresses her love for her                   grand-children       Medical Decision Making Problem Points:  Review of psycho-social stressors (1) Data Points:  Review of medication regiment & side effects (2) -CMET in the am  I certify that inpatient services furnished can reasonably be expected to improve the patient's condition.   [place], [place] 06/20/2014, 12:11 PM I agreed with findings and treatment plan of this patient

## 2014-06-20 NOTE — [facility] Group Notes (Signed)
[facility] Group Notes: (Clinical Social Work)   06/20/2014      Type of Therapy:  Group Therapy   Participation Level:  Did Not Attend - refused   Ambrose MantleMareida Grossman-Orr, LCSW 06/20/2014, 12:20 PM

## 2014-06-20 NOTE — Progress Notes (Signed)
Patient ID: Dana GrumblingCarolyn R Peralta, female   DOB: 11/08/1950, 63 y.o.   MRN: [id number] D)  Has been in the dayroom this evening, attended AA group and had a snack, came to the med window afterward for hs meds.  Seemed somewhat irritable when she had to wait for peer to get their meds, made a racial comment, then apologized.  Was compliant with meds and program this evening. A)  Will continue to monitor q 15 minutes for safety, support ,  Continue POC R) Safety maintained.

## 2014-06-21 MED ORDER — TRAZODONE HCL 50 MG PO TABS: 50.0000 mg | ORAL_TABLET | Freq: Every evening | ORAL | Status: DC | PRN

## 2014-06-21 MED ORDER — LISINOPRIL 5 MG PO TABS: 5.0000 mg | ORAL_TABLET | Freq: Every day | ORAL | Status: DC

## 2014-06-21 MED ORDER — GABAPENTIN 300 MG PO CAPS: 300.0000 mg | ORAL_CAPSULE | Freq: Two times a day (BID) | ORAL | Status: DC

## 2014-06-21 NOTE — Progress Notes (Signed)
Patient ID: Dana GrumblingCarolyn R Soltys, female   DOB: 11/24/1950, 63 y.o.   MRN: [id number] D)  Has been out and about on the hall this evening, interacting with staff and select peers appropriately.  Attended the AA group this evening and said it had been one of the best meetings she had been to, felt she had learned a lot and that it addressed her issues.  C/o anxiety afterward and BP was also elevated.  NP was made aware, order given for vistaril 50 mg, BP recheck.  Was still elevated, pt admitted she had been on bp med pta.  Np gave order for lisinopril 5 mg, given.   A)  Will continue to monitor for safety, support and encouragement, continue POC R)  Appreciative, safety maintained.

## 2014-06-21 NOTE — [facility] Suicide Risk Assessment (Signed)
[facility] INPATIENT:  Family/Significant Other Suicide Prevention Education  Suicide Prevention Education:  Patient Refusal for Family/Significant Other Suicide Prevention Education: The patient Dana Mcintosh has refused to provide written consent for family/significant other to be provided Family/Significant Other Suicide Prevention Education during admission and/or prior to discharge.  Physician notified. SPE reviewed with patient, brochure provided. Chestina Kom[place], [place]in L 06/21/2014, 11:31 AM

## 2014-06-21 NOTE — Progress Notes (Signed)
[facility] MD Progress Note  06/21/2014 2:59 PM Dana Mcintosh  MRN:  [id number] Subjective:  Akshitha states she feels ready to go to [facility] in the morning. She is sleeping better, her appetite is back to normal. States she can ambulate without aids as long as she can take her time. She states she is committed to abstinence. She states that once she is out of [facility] she will figure out of where to go from there Diagnosis:   DSM5: Substance/Addictive Disorders:  Alcohol Related Disorder - Severe (303.90) and Cannabis Use Disorder - Moderate 9304.30), Cocaine related disorder moderate Depressive Disorders:  Major Depressive Disorder - Mild (296.21) Total Time spent with patient: 30 minutes  Axis I: Substance Induced Mood Disorder  ADL's:  Intact  Sleep: Fair  Appetite:  Fair Psychiatric Specialty Exam: Physical Exam  Review of Systems  Constitutional: Negative.   HENT: Negative.   Eyes: Negative.   Respiratory: Negative.   Cardiovascular: Negative.   Gastrointestinal: Negative.   Genitourinary: Negative.   Musculoskeletal: Negative.   Skin: Negative.   Neurological: Negative.   Endo/Heme/Allergies: Negative.   Psychiatric/Behavioral: Positive for substance abuse.    Blood pressure 100/78, pulse 61, temperature 98 F (36.7 C), temperature source Oral, resp. rate 16, height 5' 5.75" (1.67 m), weight 63.957 kg (141 lb), last menstrual period 07/27/1997.Body mass index is 22.93 kg/(m^2).  General Appearance: Fairly Groomed  Engineer, water::  Fair  Speech:  Clear and Coherent  Volume:  Decreased  Mood:  Euthymic  Affect:  Restricted  Thought Process:  Coherent and Goal Directed  Orientation:  Full (Time, Place, and Person)  Thought Content:  plans as she moves on, relapse prevention plan  Suicidal Thoughts:  No  Homicidal Thoughts:  No  Memory:  Immediate;   Fair Recent;   Fair Remote;   Fair  Judgement:  Fair  Insight:  Present  Psychomotor Activity:  Normal  Concentration:   Fair  Recall:  [employer] of Knowledge:NA  Language: Fair  Akathisia:  No  Handed:    AIMS (if indicated):     Assets:  Desire for Improvement  Sleep:  Number of Hours: 4.25   Musculoskeletal: Strength & Muscle Tone: within normal limits Gait & Station: affected by foot pain Patient leans: N/A  Current Medications: Current Facility-Administered Medications  Medication Dose Route Frequency Provider Last Rate Last Dose  . alum & mag hydroxide-simeth (MAALOX/MYLANTA) 200-200-20 MG/5ML suspension 30 mL  30 mL Oral Q4H PRN Laverle Hobby, PA-C      . feeding supplement (ENSURE COMPLETE) (ENSURE COMPLETE) liquid 237 mL  237 mL Oral TID BM Clayton Bibles, RD   237 mL at 06/20/14 1400  . gabapentin (NEURONTIN) capsule 300 mg  300 mg Oral BID Laverle Hobby, PA-C   300 mg at 06/21/14 2111  . lisinopril (PRINIVIL,ZESTRIL) tablet 5 mg  5 mg Oral Daily Evanna Glenda Chroman, NP   5 mg at 06/20/14 2325  . magnesium hydroxide (MILK OF MAGNESIA) suspension 30 mL  30 mL Oral Daily PRN Laverle Hobby, PA-C   30 mL at 06/17/14 2133  . multivitamin with minerals tablet 1 tablet  1 tablet Oral Daily Laverle Hobby, PA-C   1 tablet at 06/21/14 0846  . potassium chloride (K-DUR,KLOR-CON) CR tablet 10 mEq  10 mEq Oral Daily Waylan Boga, NP   10 mEq at 06/21/14 0843  . thiamine (B-1) injection 100 mg  100 mg Intramuscular Once [employer], PA-C      .  thiamine (VITAMIN B-1) tablet 100 mg  100 mg Oral Daily Laverle Hobby, PA-C   100 mg at 06/21/14 8441  . traZODone (DESYREL) tablet 50 mg  50 mg Oral QHS,MR X 1 Laverle Hobby, PA-C   50 mg at 06/20/14 2157    Lab Results:  Results for orders placed during the hospital encounter of 06/16/14 (from the past 48 hour(s))  COMPREHENSIVE METABOLIC PANEL     Status: Abnormal   Collection Time    06/20/14  6:43 AM      Result Value Ref Range   Sodium 139  137 - 147 mEq/L   Potassium 4.2  3.7 - 5.3 mEq/L   Chloride 105  96 - 112 mEq/L   CO2 25  19 - 32  mEq/L   Glucose, Bld 101 (*) 70 - 99 mg/dL   BUN 11  6 - 23 mg/dL   Creatinine, Ser 0.84  0.50 - 1.10 mg/dL   Calcium 9.4  8.4 - 10.5 mg/dL   Total Protein 7.3  6.0 - 8.3 g/dL   Albumin 2.9 (*) 3.5 - 5.2 g/dL   AST 269 (*) 0 - 37 U/L   ALT 168 (*) 0 - 35 U/L   Alkaline Phosphatase 75  39 - 117 U/L   Total Bilirubin 0.3  0.3 - 1.2 mg/dL   GFR calc non Af Amer 74 (*) >90 mL/min   GFR calc Af Amer 85 (*) >90 mL/min   Comment: (NOTE)     The eGFR has been calculated using the CKD EPI equation.     This calculation has not been validated in all clinical situations.     eGFR's persistently <90 mL/min signify possible Chronic Kidney     Disease.   Anion gap 9  5 - 15   Comment: Performed at [facility]    Physical Findings: AIMS: Facial and Oral Movements Muscles of Facial Expression: None, normal Lips and Perioral Area: None, normal Jaw: None, normal Tongue: None, normal,Extremity Movements Upper (arms, wrists, hands, fingers): None, normal Lower (legs, knees, ankles, toes): None, normal, Trunk Movements Neck, shoulders, hips: None, normal, Overall Severity Severity of abnormal movements (highest score from questions above): None, normal Incapacitation due to abnormal movements: None, normal Patient's awareness of abnormal movements (rate only patient's report): No Awareness, Dental Status Current problems with teeth and/or dentures?: Yes (no teeth) Does patient usually wear dentures?: No  CIWA:  CIWA-Ar Total: 1 COWS:  COWS Total Score: 5  Treatment Plan Summary: Daily contact with patient to assess and evaluate symptoms and progress in treatment Medication management  Plan: Supportive approach/coping skills/relapse prevention           Facilitate admission to [facility] in the AM  Medical Decision Making Problem Points:  Review of psycho-social stressors (1) Data Points:  Review of medication regiment & side effects (2)  I certify that inpatient services  furnished can reasonably be expected to improve the patient's condition.   Jerrie Gullo A 06/21/2014, 2:59 PM

## 2014-06-21 NOTE — [facility] Group Notes (Signed)
   [facility] L[facility] After[facility] Discharge Planning Group Note  06/21/2014  8:45 AM   Participation Quality: Alert, Appropriate and Oriented  Mood/Affect: Depressed and Flat  Depression Rating: 7  Anxiety Rating: 7  Thoughts of Suicide: Pt denies SI/HI  Will you contract for safety? Yes  Current AVH: Pt denies  Plan for Discharge/Comments: Pt attended discharge planning group and actively participated in group. CSW provided pt with today's workbook. When asked how patient was feeling today, she replied "not good." She is agreeable to following up with [facility] on 10/27 for residential treatment.  Transportation Means: Pt reports access to transportation  Supports: No supports mentioned at this time  Samuella BruinKristin Cristian Grieves, MSW, [employer]CSWA Clinical Social Worker [facility] [phone]

## 2014-06-21 NOTE — Progress Notes (Signed)
Patient ID: Dana GrumblingCarolyn R Clason, female   DOB: 07/24/1951, 63 y.o.   MRN: [id number] She has been in bed most of the day ,up late this PM. [facility] that her sister was going to pick her up. Self inventory this AM: depression 7  , hopelessness 7,  Anxiety 7, withdrawals of tremors, chilling, runny nose, SI sometimes, Physical problem yes, pain in back, goal is to work on recovery.  She is working on Paramedicgettingg all her thing together so that she may leave in the AM.

## 2014-06-21 NOTE — Clinical Social Work Note (Signed)
Patient has declined for CSW to make contact with family. She reports that her sister can pick her up in the morning to [facility]  Samuella BruinKristin Cullin Dishman, MSW, [facility] Clinical Social Worker [facility] [phone]

## 2014-06-21 NOTE — BHH Suicide Risk Assessment (Signed)
Suicide Risk Assessment  Discharge Assessment     Demographic Factors:  Unemployed  Total Time spent with patient: 30 minutes  Psychiatric Specialty Exam:     Blood pressure 100/78, pulse 61, temperature 98 F (36.7 C), temperature source Oral, resp. rate 16, height 5' 5.75" (1.67 m), weight 63.957 kg (141 lb), last menstrual period 07/27/1997.Body mass index is 22.93 kg/(m^2).  General Appearance: Fairly Groomed  Patent attorneyye Contact::  Fair  Speech:  Clear and Coherent  Volume:  Decreased  Mood:  Euthymic  Affect:  Restricted  Thought Process:  Coherent and Goal Directed  Orientation:  Full (Time, Place, and Person)  Thought Content:  plans as she moves on, relapse prevention plan  Suicidal Thoughts:  No  Homicidal Thoughts:  No  Memory:  Immediate;   Fair Recent;   Fair Remote;   Fair  Judgement:  Fair  Insight:  Present  Psychomotor Activity:  Normal  Concentration:  Fair  Recall:  [employer]  Fund of Knowledge:NA  Language: Fair  Akathisia:  No  Handed:    AIMS (if indicated):     Assets:  Desire for Improvement  Sleep:  Number of Hours: 4.25    Musculoskeletal: Strength & Muscle Tone: within normal limits Gait & Station: affected by foot pain Patient leans: N/A   Mental Status Per Nursing Assessment::   On Admission:     Current Mental Status by Physician: In full contact with reality. There are no active S/S of withdrawal. No active SI plans or intent. Expresses motivation to pursue long term sobriety   Loss Factors: Decline in physical health  Historical Factors: NA  Risk Reduction Factors:   Sense of responsibility to family and Living with another person, especially a relative  Continued Clinical Symptoms:  Depression:   Comorbid alcohol abuse/dependence Alcohol/Substance Abuse/Dependencies  Cognitive Features That Contribute To Risk:  Closed-mindedness Polarized thinking Thought constriction (tunnel vision)    Suicide Risk:  Minimal: No  identifiable suicidal ideation.  Patients presenting with no risk factors but with morbid ruminations; may be classified as minimal risk based on the severity of the depressive symptoms  Discharge Diagnoses:   AXIS I:  Alcohol Dependence, Cocaine Abuse, Cannabis Abuse, Substance Induced Mood Disorder AXIS II:  No diagnosis AXIS III:   Past Medical History  Diagnosis Date  . Hypertension   . ETOH abuse   . Substance abuse   . Mental disorder   . Depression   . Hepatitis C    AXIS IV:  other psychosocial or environmental problems AXIS V:  61-70 mild symptoms  Plan Of Care/Follow-up recommendations:  Activity:  as tolerated Diet:  regular Follow up [facility]  Is patient on multiple antipsychotic therapies at discharge:  No   Has Patient had three or more failed trials of antipsychotic monotherapy by history:  No  Recommended Plan for Multiple Antipsychotic Therapies: NA    Corvin Sorbo A 06/21/2014, 3:11 PM

## 2014-06-21 NOTE — Progress Notes (Signed)
Patient ID: Dana Mcintosh, female   DOB: 03/05/1951, 63 y.o.   MRN: [id number] Pt did not attend the 10 am Psycho Education group

## 2014-06-21 NOTE — BHH Group Notes (Signed)
[facility] LCSW Group Therapy 06/21/2014  1:15 PM   Type of Therapy: Group Therapy  Participation Level: Did Not Attend for unknown reason.   Kerrigan Glendening, MSW, LCSWA Clinical Social Worker [facility] [phone]   

## 2014-06-21 NOTE — Progress Notes (Signed)
[facility] Adult [facility] Management Discharge Plan :  Will you be returning to the same living situation after discharge: No, [facility] At discharge, do you have transportation home?:Yes,  patient reports that she will have family pick her up Do you have the ability to pay for your medications:Yes,  patient will be provided with medication samples and prescriptions at discharge.  Release of information consent forms completed and in the chart;  Patient's signature needed at discharge.  Patient to Follow up at: Follow-up Information   [facility]. ([facility] Tuesday Oct. [facility]th at 8 am.)    Contact information:   Address: [address], [place], [place] [address] Phone: [phone] Fax: [phone]      Patient denies SI/HI:   Yes,  denies    Safety Planning and Suicide Prevention discussed:  Yes,  with patient  Harmonie Verrastro, West CarboKristin L 06/21/2014, 11:32 AM

## 2014-06-22 DIAGNOSIS — F332 Major depressive disorder, recurrent severe without psychotic features: Secondary | ICD-10-CM

## 2014-06-22 DIAGNOSIS — F1910 Other psychoactive substance abuse, uncomplicated: Secondary | ICD-10-CM

## 2014-06-22 NOTE — Progress Notes (Signed)
D   Pt is irritable and complains of having a hard time getting sleep    She did attend AA group this evening but has limited participation    A   Verbal support and encouragement given   Medications administered and effectiveness monitored    Q 15 min checks R   Pt safe at present

## 2014-06-22 NOTE — Progress Notes (Signed)
Pt to be discharged today [facility]   Pt will be transported by her sister who is supposed to be here at 7:15   Discharge folllow up and all other instructions were given to the pt and she verbalized understanding   She denies suicidal and homicidal ideation   Pt to be discharged with her belongings from the locker by 7:10

## 2014-06-24 NOTE — Discharge Summary (Signed)
Physician Discharge Summary Note  Patient:  Dana Mcintosh is an 63 y.o., female MRN:  [id number] DOB:  May 25, 1951 Patient phone:  [phone] (home)  Patient address:   [address] [place] [address],  Total Time spent with patient: 45 minutes  Date of Admission:  06/16/2014 Date of Discharge: 06/21/2014  Reason for Admission:  Depression and Substance abuse  Discharge Diagnoses:  Alcohol Related Disorder - Severe (303.90) and Cannabis Use Disorder - Moderate 9304.30), Cocaine related disorder moderate, Major Depressive Disorder - Mild (296.21)  Active Problems:   Cocaine abuse   Cannabis abuse   Alcohol-induced mood disorder   Alcohol dependence   Severe major depression with psychotic features  Psychiatric Specialty Exam: Physical Exam  Vitals reviewed. Psychiatric: She has a normal mood and affect. Her speech is normal and behavior is normal. Judgment and thought content normal. Cognition and memory are normal.    Review of Systems  Constitutional: Negative.   HENT: Negative.   Eyes: Negative.   Respiratory: Negative.   Gastrointestinal: Negative.   Genitourinary: Negative.   Musculoskeletal: Negative.   Skin: Negative.   Neurological: Negative.   Endo/Heme/Allergies: Negative.   Psychiatric/Behavioral: Positive for depression (Hx of, chronic, stabilized) and substance abuse (Hx of, chronic, stabilized). Negative for suicidal ideas, hallucinations and memory loss. The patient is nervous/anxious. The patient does not have insomnia (Hx of, chronic, stabilized).     Blood pressure 117/72, pulse 60, temperature 97.6 F (36.4 C), temperature source Oral, resp. rate 16, height 5' 5.75" (1.67 m), weight 63.957 kg (141 lb), last menstrual period 07/27/1997.Body mass index is 22.93 kg/(m^2).   Past Psychiatric History:  Diagnosis:   Hospitalizations: [facility]   Outpatient Care: not currently   Substance Abuse Care: [facility], [facility], [facility], [facility]   Self-Mutilation:  Denies   Suicidal Attempts: Yes, jumping [place]   Violent Behaviors: Denies    Musculoskeletal: Strength & Muscle Tone: within normal limits Gait & Station: normal Patient leans: N/A  DSM5:  Schizophrenia Disorders:  NA Obsessive-Compulsive Disorders:  NA Trauma-Stressor Disorders:  NA Substance/Addictive Disorders:  Alcohol Related Disorder - Severe (303.90) and Cannabis Use Disorder - Mild (305.20) Depressive Disorders:  Major Depressive Disorder - Mild (296.21)  Axis Diagnosis:   AXIS I:  Major Depression, Recurrent severe and Substance Abuse AXIS II:  Deferred AXIS III:   Past Medical History  Diagnosis Date  . Hypertension   . ETOH abuse   . Substance abuse   . Mental disorder   . Depression   . Hepatitis C    AXIS IV:  economic problems, occupational problems and other psychosocial or environmental problems AXIS V:  61-70 mild symptoms  Level of Care:  OP  Hospital Course:   Dana Mcintosh is [name] who was admitted for depression and substance abuse, wanting detox.  She stated she had been having a hard time with finding a place to live and her use of cocaine and alcohol depletes most of the money she does earn.  She has children and reflected about the poor choices she's made through the years.    The patient needed to get crisis management with her depression and her drinking.  She was observed daily and monitored for progress.  She tolerated her medications well and reported no adverse side effects.  Group milieu therapy was offered and she was encouraged to participate to gain insight on her substance abuse.  CSW continued to work with her and was able to facilitate outpatient  [facility] [facility] [place].    Patient reported being able to sleep better and re-committed to abstinence from alcohol.  On day of discharge, she said that [facility] was [facility]  [facility] given to patient.  Prescriptions and discharge  instructions were given to patient.  She verbalized understanding of instructions.    Consults:  psychiatry  Significant Diagnostic Studies:  labs: Per ED  Discharge Vitals:   Blood pressure 117/72, pulse 60, temperature 97.6 F (36.4 C), temperature source Oral, resp. rate 16, height 5' 5.75" (1.67 m), weight 63.957 kg (141 lb), last menstrual period 07/27/1997. Body mass index is 22.93 kg/(m^2). Lab Results:   No results found for this or any previous visit (from the past 72 hour(s)).  Physical Findings: AIMS: Facial and Oral Movements Muscles of Facial Expression: None, normal Lips and Perioral Area: None, normal Jaw: None, normal Tongue: None, normal,Extremity Movements Upper (arms, wrists, hands, fingers): None, normal Lower (legs, knees, ankles, toes): None, normal, Trunk Movements Neck, shoulders, hips: None, normal, Overall Severity Severity of abnormal movements (highest score from questions above): None, normal Incapacitation due to abnormal movements: None, normal Patient's awareness of abnormal movements (rate only patient's report): No Awareness, Dental Status Current problems with teeth and/or dentures?: Yes (no teeth) Does patient usually wear dentures?: No  CIWA:  CIWA-Ar Total: 2 COWS:  COWS Total Score: 5  Psychiatric Specialty Exam: See Psychiatric Specialty Exam and Suicide Risk Assessment completed by Attending Physician prior to discharge.  Discharge destination:  Home  Is patient on multiple antipsychotic therapies at discharge:  No   Has Patient had three or more failed trials of antipsychotic monotherapy by history:  No  Recommended Plan for Multiple Antipsychotic Therapies: NA     Medication List    STOP taking these medications       acetaminophen 500 MG tablet  Commonly known as:  TYLENOL      TAKE these medications     Indication   gabapentin 300 MG capsule  Commonly known as:  NEURONTIN  Take 1 capsule (300 mg total) by mouth 2  (two) times daily. For pain   Indication:  Pain     lisinopril 5 MG tablet  Commonly known as:  PRINIVIL,ZESTRIL  Take 1 tablet (5 mg total) by mouth daily. For high blood pressure   Indication:  High Blood Pressure     traZODone 50 MG tablet  Commonly known as:  DESYREL  Take 1 tablet (50 mg total) by mouth at bedtime and may repeat dose one time if needed. For insomnia and depression   Indication:  Trouble Sleeping, Major Depressive Disorder           Follow-up Information   [facility] On [facility]/22/2014. ([facility] Tuesday [facility]. 27th at 8 am.)    Contact information:   Address: [address], [place], [place] [address] Phone: [phone] Fax: [phone]      Follow-up recommendations:  Activity:  As tolerated Diet:  As tolerated  Comments:  1.  Take all your medications as prescribed.              2.  Report any adverse side effects to outpatient provider.                       3.  Patient instructed to not use alcohol or illegal drugs while on prescription medicines.  4.  In the event of worsening symptoms, instructed patient to call 911, the crisis hotline or go to nearest emergency room for evaluation of symptoms.  Total Discharge Time:  Greater than 30 minutes.  SignedAdonis Brook: AGUSTIN, SHEILA MAY, AGNP-BC 06/24/2014, 8:22 AM  I personally assessed the patient and formulated the plan Madie RenoIrving A. Dub MikesLugo, M.D.

## 2014-06-25 NOTE — ED Provider Notes (Signed)
Medical screening examination/treatment/procedure(s) were performed by non-physician practitioner and as supervising physician I was immediately available for consultation/collaboration.   EKG Interpretation None       Raeford RazorStephen Kesi Perrow, MD 06/25/14 1500

## 2014-06-25 NOTE — Progress Notes (Signed)
Patient Discharge Instructions:  After Visit Summary (AVS):   Faxed to:  06/25/14 Discharge Summary Note:   Faxed to:  06/25/14 Psychiatric Admission Assessment Note:   Faxed to:  06/25/14 Suicide Risk Assessment - Discharge Assessment:   Faxed to:  06/25/14 Faxed/Sent to the Next Level Care provider:  06/25/14 [facility] to [facility] @ [phone]  Jerelene ReddenSheena E [place], 06/25/2014, 3:40 PM

## 2015-08-04 IMAGING — CR DG TIBIA/FIBULA 2V*R*
4 series · 4 of 4 positions shown · non-contrast
Comparison: No priors.

CLINICAL DATA: History of trauma from a fall complaining of pain in
the right leg.

EXAM:
RIGHT TIBIA AND FIBULA - 2 VIEW

[t tib/fib ap right (1 of 2)]
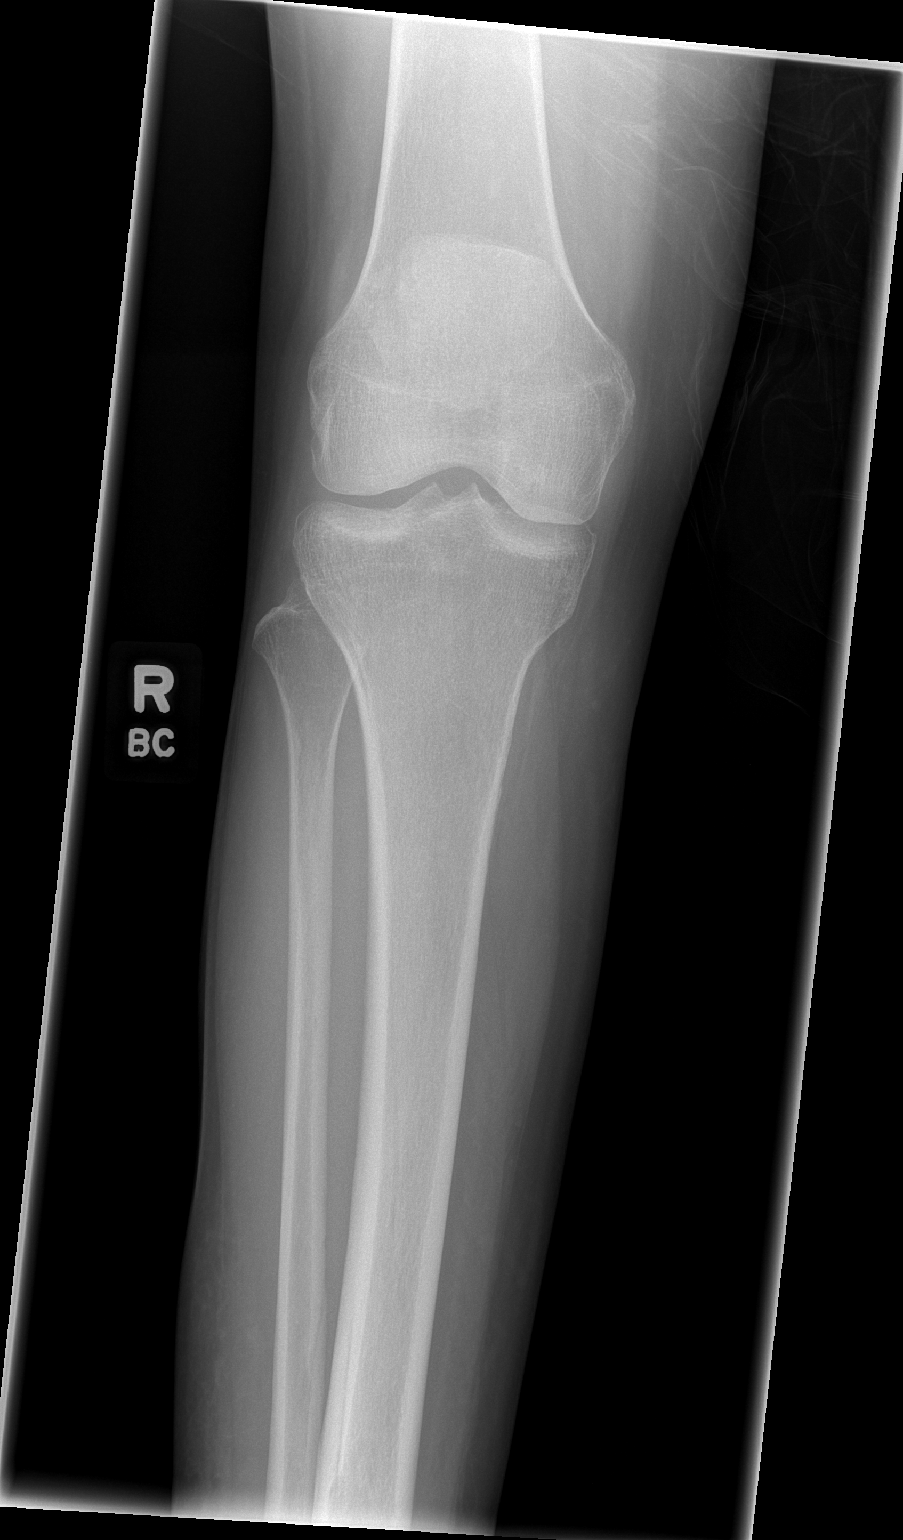

[t tib/fib ap right (2 of 2)]
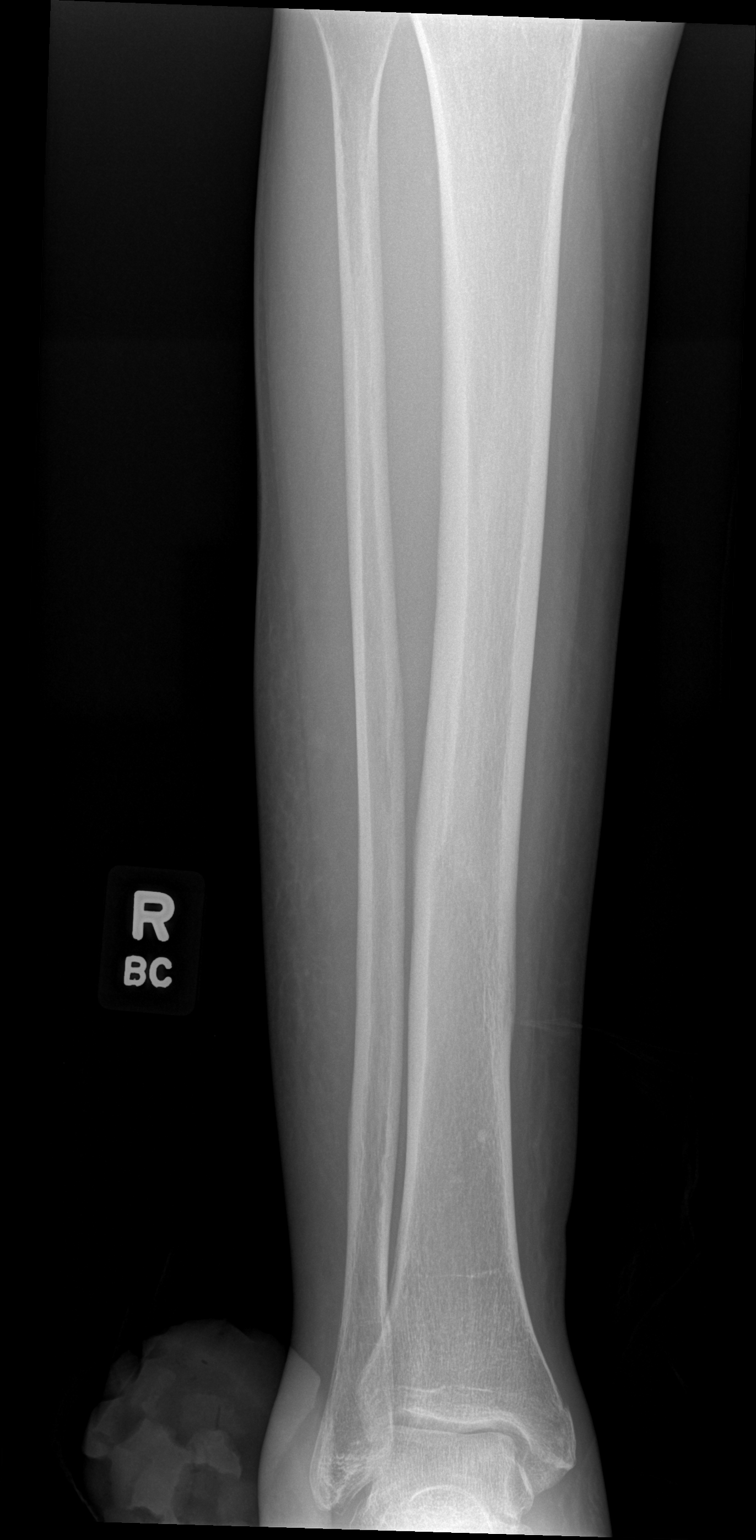

[t tib/fib lat right (1 of 2)]
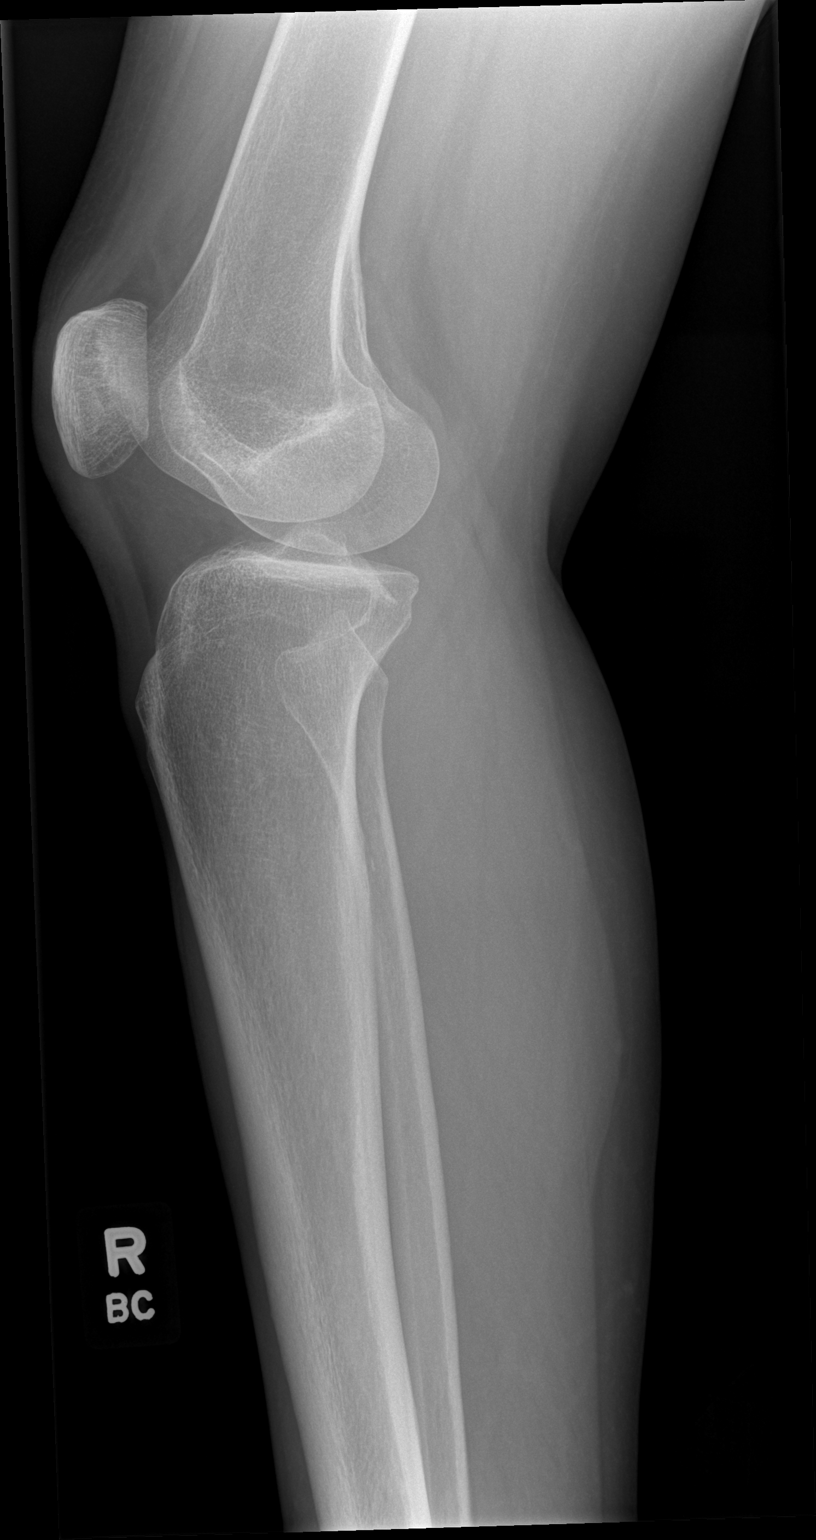

[t tib/fib lat right (2 of 2)]
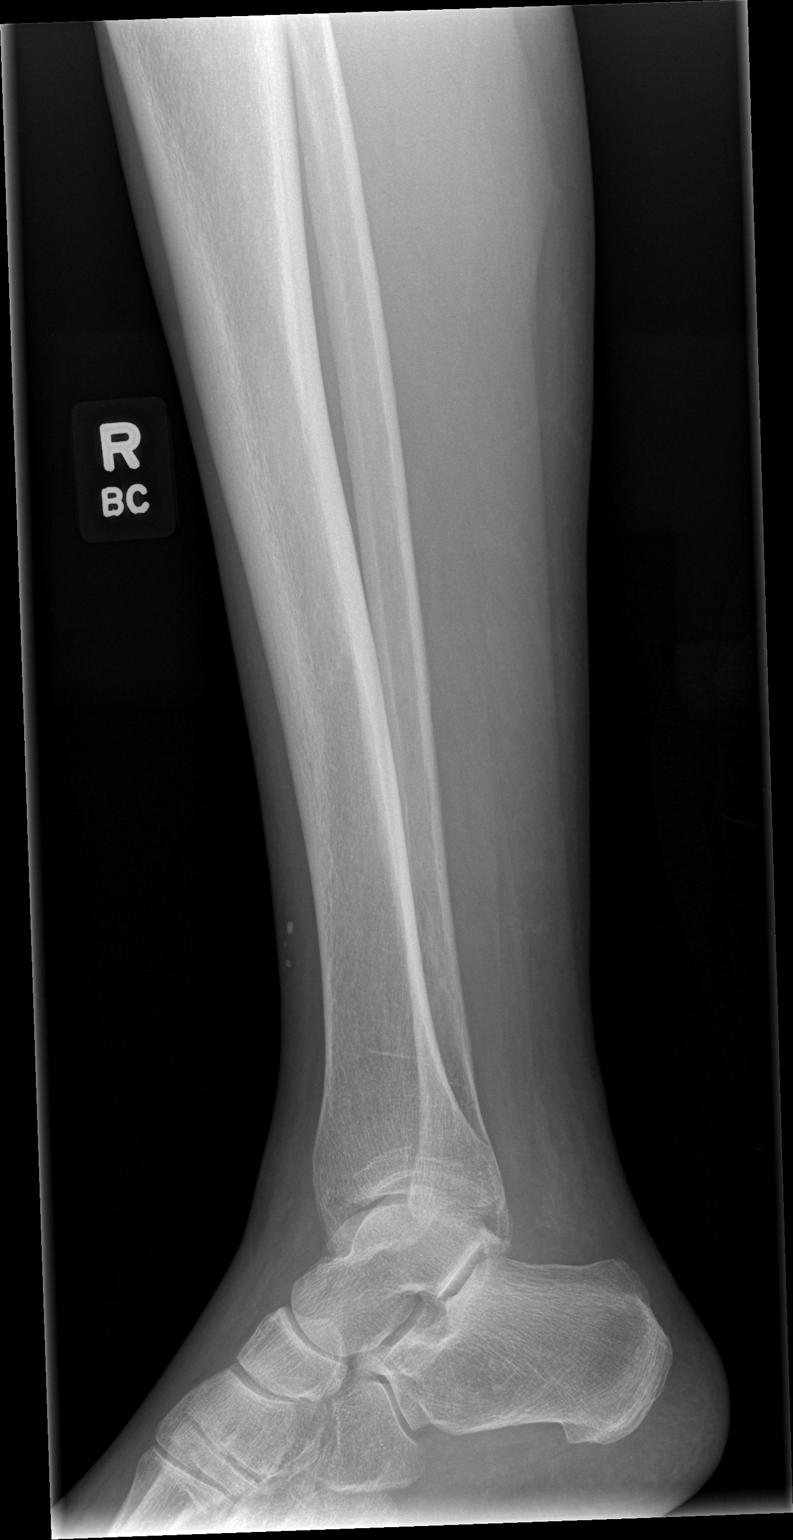

[4 of 4 positions shown; findings below may reference images not displayed]

FINDINGS: AP and lateral views of the right tibia and fibula demonstrate a
cortical irregularity and subtle disruption of the trabeculae in the
medial malleolus. Soft tissues are swollen both medial and lateral
to the ankle. Fibula is intact. Ankle mortise is preserved.
IMPRESSION: 1. Probable nondisplaced fracture of the medial malleolus.

## 2015-08-24 ENCOUNTER — Telehealth: Payer: Self-pay | Admitting: Internal Medicine

## 2015-08-24 NOTE — Telephone Encounter (Signed)
Call to patient to confirm appointment for 08/30/15 at 10:45 lmtcb

## 2015-08-30 ENCOUNTER — Ambulatory Visit (INDEPENDENT_AMBULATORY_CARE_PROVIDER_SITE_OTHER): Payer: Medicaid Other | Admitting: Internal Medicine

## 2015-08-30 ENCOUNTER — Encounter: Payer: Self-pay | Admitting: Internal Medicine

## 2015-08-30 VITALS — BP 170/89 | HR 56 | Temp 98.0°F | Ht 67.0 in | Wt 156.1 lb

## 2015-08-30 DIAGNOSIS — R911 Solitary pulmonary nodule: Secondary | ICD-10-CM | POA: Diagnosis not present

## 2015-08-30 DIAGNOSIS — R1032 Left lower quadrant pain: Secondary | ICD-10-CM | POA: Diagnosis not present

## 2015-08-30 DIAGNOSIS — M545 Low back pain: Secondary | ICD-10-CM | POA: Diagnosis not present

## 2015-08-30 DIAGNOSIS — R1031 Right lower quadrant pain: Secondary | ICD-10-CM

## 2015-08-30 DIAGNOSIS — F17210 Nicotine dependence, cigarettes, uncomplicated: Secondary | ICD-10-CM

## 2015-08-30 DIAGNOSIS — F1910 Other psychoactive substance abuse, uncomplicated: Secondary | ICD-10-CM

## 2015-08-30 DIAGNOSIS — F1020 Alcohol dependence, uncomplicated: Secondary | ICD-10-CM

## 2015-08-30 DIAGNOSIS — K219 Gastro-esophageal reflux disease without esophagitis: Secondary | ICD-10-CM | POA: Insufficient documentation

## 2015-08-30 DIAGNOSIS — F1220 Cannabis dependence, uncomplicated: Secondary | ICD-10-CM | POA: Diagnosis not present

## 2015-08-30 DIAGNOSIS — F1420 Cocaine dependence, uncomplicated: Secondary | ICD-10-CM | POA: Diagnosis not present

## 2015-08-30 DIAGNOSIS — I10 Essential (primary) hypertension: Secondary | ICD-10-CM | POA: Insufficient documentation

## 2015-08-30 DIAGNOSIS — F323 Major depressive disorder, single episode, severe with psychotic features: Secondary | ICD-10-CM

## 2015-08-30 DIAGNOSIS — G8929 Other chronic pain: Secondary | ICD-10-CM | POA: Diagnosis not present

## 2015-08-30 DIAGNOSIS — Z0000 Encounter for general adult medical examination without abnormal findings: Secondary | ICD-10-CM

## 2015-08-30 LAB — ETHANOL

## 2015-08-30 MED ORDER — PANTOPRAZOLE SODIUM 40 MG PO TBEC: 40.0000 mg | DELAYED_RELEASE_TABLET | Freq: Every day | ORAL | Status: DC

## 2015-08-30 MED ORDER — GABAPENTIN 300 MG PO CAPS: 300.0000 mg | ORAL_CAPSULE | Freq: Three times a day (TID) | ORAL | Status: DC

## 2015-08-30 MED ORDER — LISINOPRIL 10 MG PO TABS: 10.0000 mg | ORAL_TABLET | Freq: Every day | ORAL | Status: DC

## 2015-08-30 NOTE — Progress Notes (Signed)
Patient ID: Dana Mcintosh [place], female   DOB: 10/28/1950, 65 y.o.   MRN: [id number]    Subjective:   Patient ID: Dana Mcintosh Dana Mcintosh female   DOB: 10/11/1950 65 y.o.   MRN: [id number]  HPI: Ms.Charleigh Mcintosh Donavan FoilBass is a 65 y.o. female with PMH as below, here to establish care for management of HTN and groin pain.  Her BP has been poorly controlled recently, as she has not taken her Lisinopril 5 mg since June 2016.  She endorses near daily HA and recent nose bleeds.  She endorses intermittent sternal chest pain for the last month, which will occur at rest.  She denies chest pain on exertion.  She believes the chest pain may be related to her GERD.  She denies SOB at rest, but endorses DOE.  She endorses blurry vision, worsening over the last 8-9 months.  She denies double vision. She has never seen an ophthalmologist.  She denies dysarthria or muscle weakness.  She also complains of groin pain affecting right and left groin, but never at the same time.  She describes an ache that radiates all over the leg.  The pain occurs 1-2 times per week over the last 2 years.  It is sometimes associated with her chronic bilateral lower back pain.  The pains are decreased with baths or going to sleep. They are worse when getting up in the morning.  She denies abdominal pain, vaginal pain, or vaginal discharge.   She denies fevers, cough, orthopnea, lightheadedness, dizziness, N/V, dysphagia, constipation, diarrhea, leg swelling, dysuria, or night sweats. She denies depression, SI, or HI.  She endorses polyuria and polydipsia for the last 5 months.  She endorses a 30 lb weight gain over the last 5-6 months.  She is currently happy with her weight.  PMH: HTN, GERD, RA PSH: Cholecystectomy in the 80s. Allergies: PCN  FHx:  - DMII: parents and brothers - HTN: parents and one brother - Cancer: mother, unknown type, died at 63 yo  SHx: She is currently on disability 2/2 her arthritis, previous drug use, and previous depression. She  lives alone in an apartment.  She does not drive.  She smokes a half pack cigarettes per day for the last 45 years.  She drinks 2-3 200 mL bottles liquor twice weekly.  She uses cocaine and marijuana, with last use being New Years.   She has never had alcohol withdrawal, seizure, or DT. She went to rehab in 2015.  CAGE 2/4.    Please see Problem-Based charting for the status of the patient's chronic medical issues.   Past Medical History  Diagnosis Date  . Hypertension   . ETOH abuse   . Substance abuse   . Mental disorder   . Depression   . Hepatitis C    Current Outpatient Prescriptions  Medication Sig Dispense Refill  . gabapentin (NEURONTIN) 300 MG capsule Take 1 capsule (300 mg total) by mouth 2 (two) times daily. For pain 60 capsule 0  . lisinopril (PRINIVIL,ZESTRIL) 5 MG tablet Take 1 tablet (5 mg total) by mouth daily. For high blood pressure 30 tablet 0  . traZODone (DESYREL) 50 MG tablet Take 1 tablet (50 mg total) by mouth at bedtime and may repeat dose one time if needed. For insomnia and depression 30 tablet 0   No current facility-administered medications for this visit.   Family History  Problem Relation Age of Onset  . Diabetes Mother   . Hypertension Mother   . Cancer Father  Social History   Social History  . Marital Status: Divorced    Spouse Name: N/A  . Number of Children: N/A  . Years of Education: N/A   Social History Main Topics  . Smoking status: Current Every Day Smoker -- 0.50 packs/day for 40 years    Types: Cigarettes  . Smokeless tobacco: Not on file  . Alcohol Use: 26.4 oz/week    24 Cans of beer, 20 Shots of liquor per week  . Drug Use: 7.00 per week    Special: Cocaine, Marijuana, "Crack" cocaine  . Sexual Activity: No   Other Topics Concern  . Not on file   Social History Narrative  . No narrative on file   Review of Systems: Pertinent items noted in HPI and remainder of comprehensive ROS otherwise negative. Objective:    Physical Exam: There were no vitals filed for this visit. Physical Exam  Constitutional: She is oriented to person, place, and time and well-developed, well-nourished, and in no distress. No distress.  HENT:  Head: Normocephalic and atraumatic.  Eyes: EOM are normal. Pupils are equal, round, and reactive to light. No scleral icterus.  Neck: No JVD present. No tracheal deviation present.  Cardiovascular: Normal rate, regular rhythm, normal heart sounds and intact distal pulses.   Pulmonary/Chest: Breath sounds normal. No respiratory distress. She has no wheezes.  Abdominal: Soft. She exhibits no distension. There is no rebound and no guarding.  Large midline scar 2/2 previous cholecystectomy.  Palpation of RLQ and LLQ reproduces groin pain.  Musculoskeletal: Normal range of motion. She exhibits no edema.  Straight leg raise negative bilaterally.  Lymphadenopathy:    She has no cervical adenopathy.  Neurological: She is alert and oriented to person, place, and time.  CN II-XII intact to bedside testing. Strength 5/5 in bilateral UE and LE.  FNF normal. Romberg normal.  Skin: Skin is warm and dry. No rash noted. She is not diaphoretic.  Psychiatric:  Mood: "good" Affect: congruent     Assessment & Plan:   Patient and case were discussed with Dr. Rogelia Boga.  Please refer to Problem Based charting for further documentation.

## 2015-08-30 NOTE — Assessment & Plan Note (Signed)
H/o 7 mm pulmonary nodule seen on CT in 2015.  Repeat scan recommended in 3-12 months.  Given patient's smoking history, repeat scan is definitely warranted.  Discussed with patient the previous finding.   A/P: Pulmonary nodule of unknown etiology.  Other than DOE, she denies pulmonary complaint at this time.  Will order CT chest and other scans at follow up next month.

## 2015-08-30 NOTE — Assessment & Plan Note (Signed)
Occasional acid reflux, previously on Nexium.  A/P: Start Protonix - Protonix 40 mg

## 2015-08-30 NOTE — Assessment & Plan Note (Signed)
She also complains of groin pain affecting right and left groin, but never at the same time.  She describes an ache that radiates all over the leg.  The pain occurs 1-2 times per week over the last 2 years.  It is sometimes associated with her chronic bilateral lower back pain.  The pains are decreased with baths or going to sleep. They are worse when getting up in the morning.  She denies abdominal pain, vaginal pain, or vaginal discharge.   A/P: Groin pain likely radiating from chronic back pain or from hip OA.  Will restart patient's previous gabapentin and re-evaluate symptoms at next visit.  If patient continues to have groin pain, will order hip Xrays to evaluate for OA.   - Gabapentin 300 mg TID - Hip Xrays if no improvement on Gabapentin

## 2015-08-30 NOTE — Patient Instructions (Addendum)
1. Take Lisinopril 10 mg once daily for blood pressure. 2. Take Gabapentin 300 mg three times daily for back/groin pain. 3. Take Protonix 40 mg once daily for acid reflux.  Pantoprazole tablets What is this medicine? PANTOPRAZOLE (pan TOE pra zole) prevents the production of acid in the stomach. It is used to treat gastroesophageal reflux disease (GERD), inflammation of the esophagus, and Zollinger-Ellison syndrome. This medicine may be used for other purposes; ask your health care provider or pharmacist if you have questions. What should I tell my health care provider before I take this medicine? They need to know if you have any of these conditions: -liver disease -low levels of magnesium in the blood -an unusual or allergic reaction to omeprazole, lansoprazole, pantoprazole, rabeprazole, other medicines, foods, dyes, or preservatives -pregnant or trying to get pregnant -breast-feeding How should I use this medicine? Take this medicine by mouth. Swallow the tablets whole with a drink of water. Follow the directions on the prescription label. Do not crush, break, or chew. Take your medicine at regular intervals. Do not take your medicine more often than directed. Talk to your pediatrician regarding the use of this medicine in children. While this drug may be prescribed for children as young as 5 years for selected conditions, precautions do apply. Overdosage: If you think you have taken too much of this medicine contact a poison control center or emergency room at once. NOTE: This medicine is only for you. Do not share this medicine with others. What if I miss a dose? If you miss a dose, take it as soon as you can. If it is almost time for your next dose, take only that dose. Do not take double or extra doses. What may interact with this medicine? Do not take this medicine with any of the following medications: -atazanavir -nelfinavir This medicine may also interact with the following  medications: -ampicillin -delavirdine -erlotinib -iron salts -medicines for fungal infections like ketoconazole, itraconazole and voriconazole -methotrexate -mycophenolate mofetil -warfarin This list may not describe all possible interactions. Give your health care provider a list of all the medicines, herbs, non-prescription drugs, or dietary supplements you use. Also tell them if you smoke, drink alcohol, or use illegal drugs. Some items may interact with your medicine. What should I watch for while using this medicine? It can take several days before your stomach pain gets better. Check with your doctor or health care professional if your condition does not start to get better, or if it gets worse. You may need blood work done while you are taking this medicine. What side effects may I notice from receiving this medicine? Side effects that you should report to your doctor or health care professional as soon as possible: -allergic reactions like skin rash, itching or hives, swelling of the face, lips, or tongue -bone, muscle or joint pain -breathing problems -chest pain or chest tightness -dark yellow or brown urine -dizziness -fast, irregular heartbeat -feeling faint or lightheaded -fever or sore throat -muscle spasm -palpitations -redness, blistering, peeling or loosening of the skin, including inside the mouth -seizures -tremors -unusual bleeding or bruising -unusually weak or tired -yellowing of the eyes or skin Side effects that usually do not require medical attention (Report these to your doctor or health care professional if they continue or are bothersome.): -constipation -diarrhea -dry mouth -headache -nausea This list may not describe all possible side effects. Call your doctor for medical advice about side effects. You may report side effects to FDA at  1-800-FDA-1088. Where should I keep my medicine? Keep out of the reach of children. Store at room temperature  between 15 and 30 degrees C (59 and 86 degrees F). Protect from light and moisture. Throw away any unused medicine after the expiration date. NOTE: This sheet is a summary. It may not cover all possible information. If you have questions about this medicine, talk to your doctor, pharmacist, or health care provider.    2016, Elsevier/Gold Standard. (2014-10-01 14:45:56) Gabapentin capsules or tablets What is this medicine? GABAPENTIN (GA ba pen tin) is used to control partial seizures in adults with epilepsy. It is also used to treat certain types of nerve pain. This medicine may be used for other purposes; ask your health care provider or pharmacist if you have questions. What should I tell my health care provider before I take this medicine? They need to know if you have any of these conditions: -kidney disease -suicidal thoughts, plans, or attempt; a previous suicide attempt by you or a family member -an unusual or allergic reaction to gabapentin, other medicines, foods, dyes, or preservatives -pregnant or trying to get pregnant -breast-feeding How should I use this medicine? Take this medicine by mouth with a glass of water. Follow the directions on the prescription label. You can take it with or without food. If it upsets your stomach, take it with food.Take your medicine at regular intervals. Do not take it more often than directed. Do not stop taking except on your doctor's advice. If you are directed to break the 600 or 800 mg tablets in half as part of your dose, the extra half tablet should be used for the next dose. If you have not used the extra half tablet within 28 days, it should be thrown away. A special MedGuide will be given to you by the pharmacist with each prescription and refill. Be sure to read this information carefully each time. Talk to your pediatrician regarding the use of this medicine in children. Special care may be needed. Overdosage: If you think you have taken  too much of this medicine contact a poison control center or emergency room at once. NOTE: This medicine is only for you. Do not share this medicine with others. What if I miss a dose? If you miss a dose, take it as soon as you can. If it is almost time for your next dose, take only that dose. Do not take double or extra doses. What may interact with this medicine? Do not take this medicine with any of the following medications: -other gabapentin products This medicine may also interact with the following medications: -alcohol -antacids -antihistamines for allergy, cough and cold -certain medicines for anxiety or sleep -certain medicines for depression or psychotic disturbances -homatropine; hydrocodone -naproxen -narcotic medicines (opiates) for pain -phenothiazines like chlorpromazine, mesoridazine, prochlorperazine, thioridazine This list may not describe all possible interactions. Give your health care provider a list of all the medicines, herbs, non-prescription drugs, or dietary supplements you use. Also tell them if you smoke, drink alcohol, or use illegal drugs. Some items may interact with your medicine. What should I watch for while using this medicine? Visit your doctor or health care professional for regular checks on your progress. You may want to keep a record at home of how you feel your condition is responding to treatment. You may want to share this information with your doctor or health care professional at each visit. You should contact your doctor or health care professional if your  seizures get worse or if you have any new types of seizures. Do not stop taking this medicine or any of your seizure medicines unless instructed by your doctor or health care professional. Stopping your medicine suddenly can increase your seizures or their severity. Wear a medical identification bracelet or chain if you are taking this medicine for seizures, and carry a card that lists all your  medications. You may get drowsy, dizzy, or have blurred vision. Do not drive, use machinery, or do anything that needs mental alertness until you know how this medicine affects you. To reduce dizzy or fainting spells, do not sit or stand up quickly, especially if you are an older patient. Alcohol can increase drowsiness and dizziness. Avoid alcoholic drinks. Your mouth may get dry. Chewing sugarless gum or sucking hard candy, and drinking plenty of water will help. The use of this medicine may increase the chance of suicidal thoughts or actions. Pay special attention to how you are responding while on this medicine. Any worsening of mood, or thoughts of suicide or dying should be reported to your health care professional right away. Women who become pregnant while using this medicine may enroll in the [place] [employer] Antiepileptic Drug Pregnancy Registry by calling [phone]. This registry collects information about the safety of antiepileptic drug use during pregnancy. What side effects may I notice from receiving this medicine? Side effects that you should report to your doctor or health care professional as soon as possible: -allergic reactions like skin rash, itching or hives, swelling of the face, lips, or tongue -worsening of mood, thoughts or actions of suicide or dying Side effects that usually do not require medical attention (report to your doctor or health care professional if they continue or are bothersome): -constipation -difficulty walking or controlling muscle movements -dizziness -nausea -slurred speech -tiredness -tremors -weight gain This list may not describe all possible side effects. Call your doctor for medical advice about side effects. You may report side effects to FDA at 1-800-FDA-1088. Where should I keep my medicine? Keep out of reach of children. This medicine may cause accidental overdose and death if it taken by other adults, children, or pets. Mix any unused  medicine with a substance like cat litter or coffee grounds. Then throw the medicine away in a sealed container like a sealed bag or a coffee can with a lid. Do not use the medicine after the expiration date. Store at room temperature between 15 and 30 degrees C (59 and 86 degrees F). NOTE: This sheet is a summary. It may not cover all possible information. If you have questions about this medicine, talk to your doctor, pharmacist, or health care provider.    2016, Elsevier/Gold Standard. (2013-10-09 15:26:50)

## 2015-08-30 NOTE — Assessment & Plan Note (Addendum)
BP Readings from Last 3 Encounters:  08/30/15 170/89  06/22/14 117/72  06/16/14 107/79    Lab Results  Component Value Date   NA 139 06/20/2014   K 4.2 06/20/2014   CREATININE 0.84 06/20/2014    Her BP has been poorly controlled recently, as she has not taken her Lisinopril 5 mg since June 2016.  She endorses near daily HA and recent nose bleeds.  She endorses intermittent sternal chest pain for the last month, which will occur at rest.  She denies chest pain on exertion.  She believes the chest pain may be related to her GERD.  She denies SOB at rest, but endorses DOE.  She endorses blurry vision, worsening over the last 8-9 months.  She denies double vision. She has never seen an ophthalmologist.  She denies dysarthria or muscle weakness.  A/P: Poorly controlled.  Patient has been off of medication for last 7 months.  Will restart medication at higher dose and reassess in 1 month. - Lisinopril 10 mg daily [ ]  CMP [ ]  Order Ophthalmology referral at next visit to evaluate for hypertensive retinopathy.

## 2015-08-30 NOTE — Assessment & Plan Note (Signed)
Patient has h/o 5th metatarsal fracture after fall from standing.  She may benefit from DEXA scanning for osteoporosis.  Will order at next visit along with other imaging.  A/P: - DEXA next visit.

## 2015-08-30 NOTE — Progress Notes (Signed)
Internal Medicine Clinic Attending  Case discussed with Dr. Taylor at the time of the visit.  We reviewed the resident's history and exam and pertinent patient test results.  I agree with the assessment, diagnosis, and plan of care documented in the resident's note. 

## 2015-08-30 NOTE — Assessment & Plan Note (Signed)
Patient has h/o polysubstance abuse causing major depression and CT findings concerning for cirrhosis.  She reports continued use or alcohol, cocaine, and marijuana.  Patient aware of the health complications of continued use.  HIV last negative in 2015.   A/P:  Patient counseled on abstinence of mood altering drugs, especially given her history of depression and suicidal ideation.  Will check blood work and UDS.   - CBC, CMP, Amylase, Lipase, EtOH

## 2015-08-31 LAB — CBC WITH DIFFERENTIAL/PLATELET
Basophils Absolute: 0 10*3/uL (ref 0.0–0.2)
Basos: 0 %
EOS (ABSOLUTE): 0.1 10*3/uL (ref 0.0–0.4)
EOS: 2 %
Hematocrit: 40.2 % (ref 34.0–46.6)
Hemoglobin: 14.2 g/dL (ref 11.1–15.9)
IMMATURE GRANS (ABS): 0 10*3/uL (ref 0.0–0.1)
IMMATURE GRANULOCYTES: 0 %
LYMPHS: 44 %
Lymphocytes Absolute: 2.4 10*3/uL (ref 0.7–3.1)
MCH: 33.6 pg — ABNORMAL HIGH (ref 26.6–33.0)
MCHC: 35.3 g/dL (ref 31.5–35.7)
MCV: 95 fL (ref 79–97)
MONOCYTES: 9 %
MONOS ABS: 0.5 10*3/uL (ref 0.1–0.9)
NEUTROS PCT: 45 %
Neutrophils Absolute: 2.4 10*3/uL (ref 1.4–7.0)
PLATELETS: 143 10*3/uL — AB (ref 150–379)
RBC: 4.22 x10E6/uL (ref 3.77–5.28)
RDW: 14.8 % (ref 12.3–15.4)
WBC: 5.3 10*3/uL (ref 3.4–10.8)

## 2015-08-31 LAB — CMP14 + ANION GAP
ALT: 171 IU/L — AB (ref 0–32)
ANION GAP: 16 mmol/L (ref 10.0–18.0)
AST: 254 IU/L — AB (ref 0–40)
Albumin/Globulin Ratio: 0.8 — ABNORMAL LOW (ref 1.1–2.5)
Albumin: 3.6 g/dL (ref 3.6–4.8)
Alkaline Phosphatase: 86 IU/L (ref 39–117)
BUN/Creatinine Ratio: 11 (ref 11–26)
BUN: 10 mg/dL (ref 8–27)
Bilirubin Total: 0.9 mg/dL (ref 0.0–1.2)
CALCIUM: 9.3 mg/dL (ref 8.7–10.3)
CO2: 23 mmol/L (ref 18–29)
CREATININE: 0.95 mg/dL (ref 0.57–1.00)
Chloride: 103 mmol/L (ref 96–106)
GFR calc Af Amer: 74 mL/min/{1.73_m2} (ref >59)
GFR, EST NON AFRICAN AMERICAN: 64 mL/min/{1.73_m2} (ref >59)
Globulin, Total: 4.4 g/dL (ref 1.5–4.5)
Glucose: 89 mg/dL (ref 65–99)
Potassium: 3.9 mmol/L (ref 3.5–5.2)
Sodium: 142 mmol/L (ref 134–144)
Total Protein: 8 g/dL (ref 6.0–8.5)

## 2015-08-31 LAB — LIPASE: Lipase: 58 U/L (ref 0–59)

## 2015-08-31 LAB — TSH: TSH: 4.11 u[IU]/mL (ref 0.450–4.500)

## 2015-08-31 LAB — AMYLASE: Amylase: 118 U/L (ref 31–124)

## 2015-09-02 LAB — TOXASSURE SELECT,+ANTIDEPR,UR: PDF: 0

## 2015-09-19 IMAGING — CT CT ABD-PELV W/O CM
2 of 5 series · 9 of 46 positions shown, 11 images · non-contrast
Comparison: None.

CLINICAL DATA: Abdominal pain. Dysuria. Alcohol abuse. Substance
abuse.

EXAM:
CT ABDOMEN AND PELVIS WITHOUT CONTRAST
TECHNIQUE: Multidetector CT imaging of the abdomen and pelvis was performed
following the standard protocol without IV contrast.

[Series 205: sagittal · sagittal · 0.70mm/px · 1 of 107 slices shown, 2 images]
[im 36/107  soft-tissue]
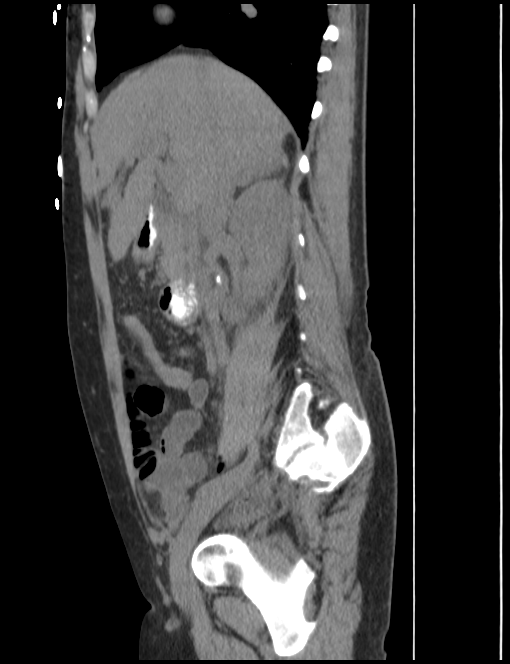
[im 36/107  bone]
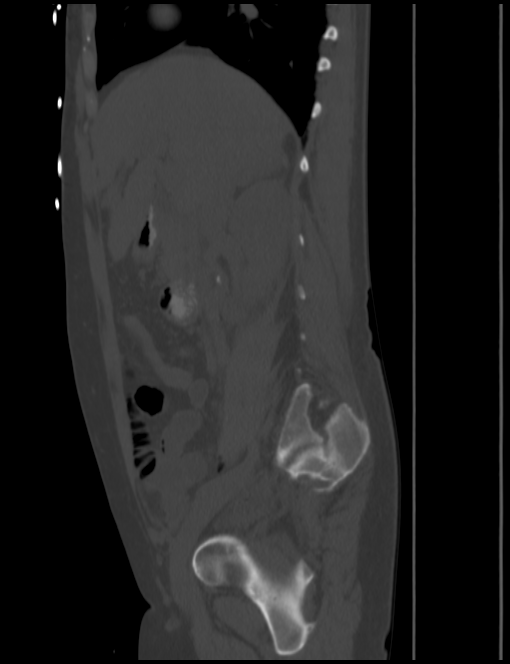

[Series 206: coronal · coronal · 0.70mm/px · 8 of 78 slices shown, 9 images]
[im 9/78  soft-tissue]
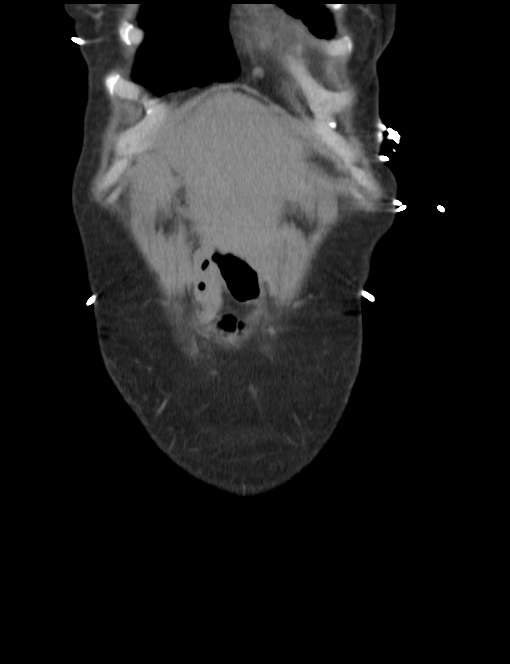
[im 9/78  bone]
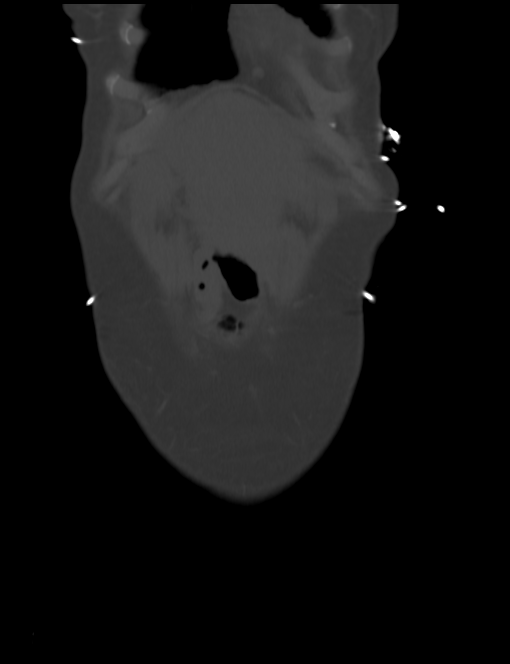
[im 18/78  soft-tissue]
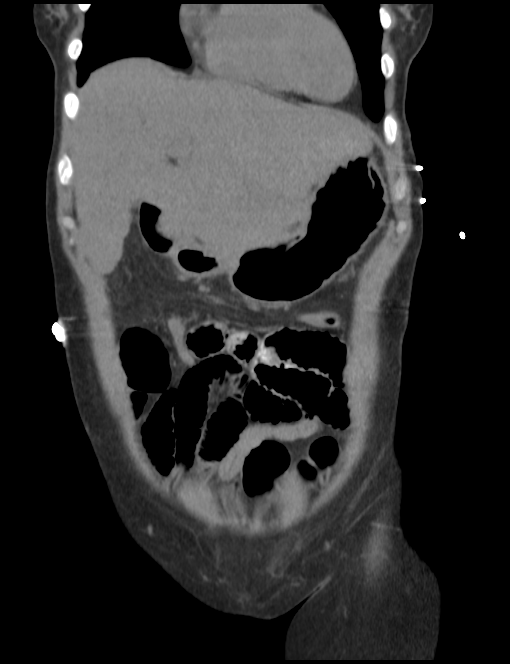
[im 26/78  soft-tissue]
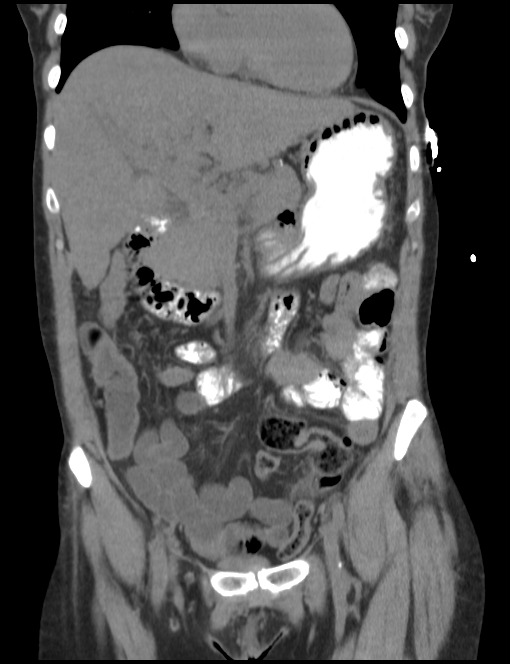
[im 35/78  soft-tissue]
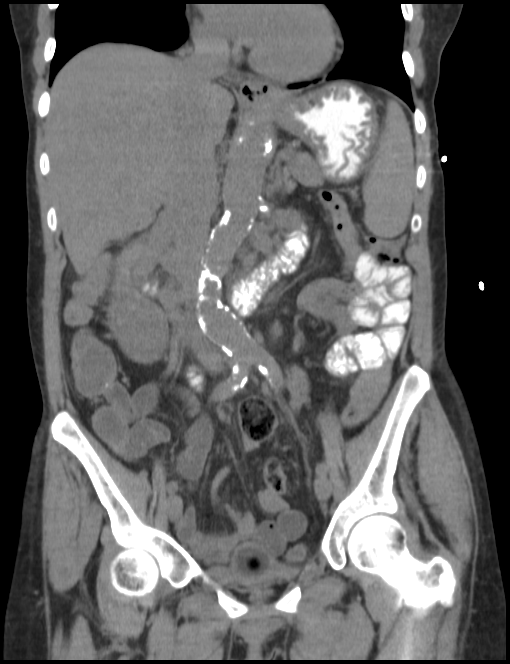
[im 43/78  soft-tissue]
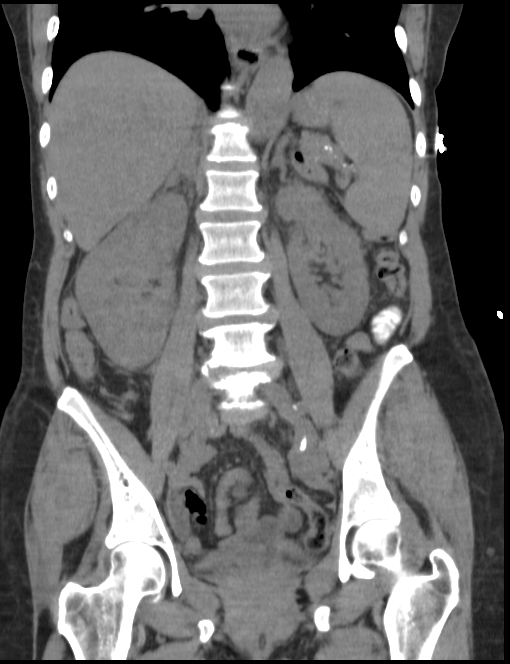
[im 52/78  soft-tissue]
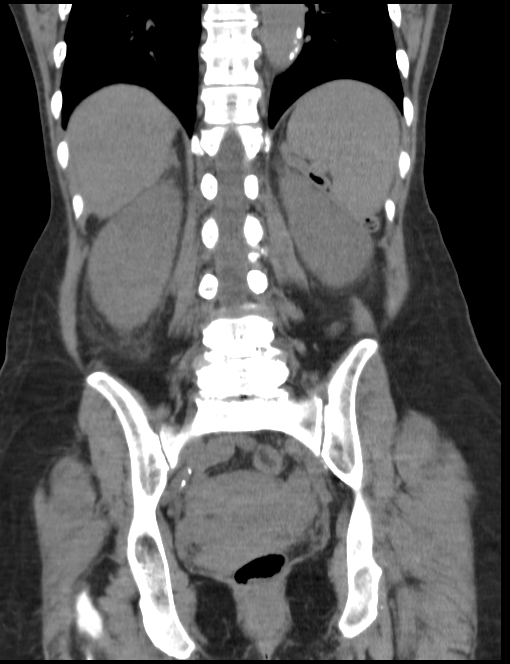
[im 60/78  soft-tissue]
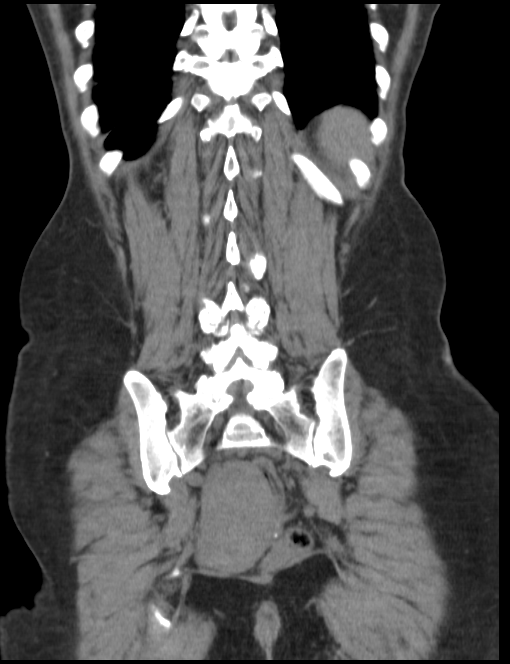
[im 69/78  soft-tissue]
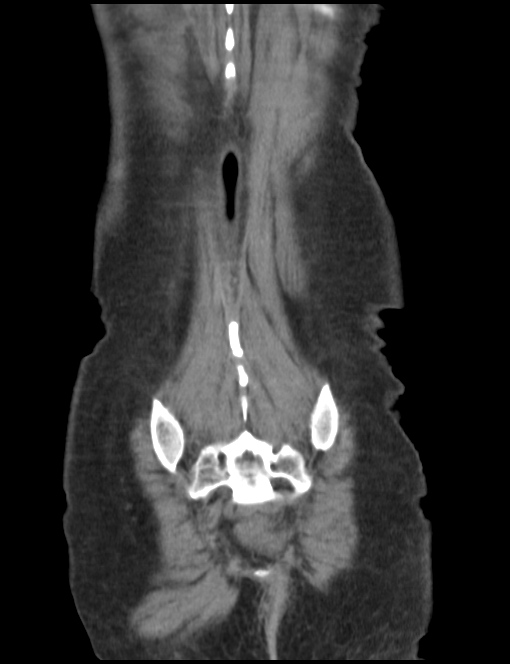

[9 of 46 positions shown; findings below may reference images not displayed]

FINDINGS: No focal hepatic abnormality. Liver contour is slightly irregular
suggesting chronic hepatocellular disease and/or cirrhosis. No focal
splenic abnormality. Pancreas is unremarkable. Cholecystectomy. No
significant biliary distention. Perigastric, peripancreatic,
perisplenic, and retroperitoneal serpiginous densities are noted.
These may represent varices.

Adrenals are normal. The right kidney is enlarged. Adjacent
perirenal fat plain stranding present. Infectious or infiltrating
process in the right kidney cannot be excluded. Calcific density
noted about the right renal pelvis is most likely renal vascular,
possibly calcification in a tiny 7 mm right renal artery aneurysm.
However a renal pelvic stone cannot be excluded. No definite
hydronephrosis is identified. Left kidney is unremarkable. Foley
catheter is present in the bladder. The bladder is nondistended.
Uterus and adnexa are unremarkable. No significant free pelvic
fluid.

Shotty inguinal lymph nodes. Retroperitoneal nodular densities as
noted above. Although these may represent lymph nodes nodes, given
their serpiginous nature, they most likely represent varices.
Aortoiliac atherosclerotic vascular calcification and ectasia noted.
Renal artery atherosclerotic vascular disease.

Appendix normal. No bowel obstruction. No free air. A supraumbilical
midline abdominal tiny hernia is present. There is herniation of a
loop of small bowel. No evidence of bowel obstruction. No
inflammatory changes noted in the region of the hernia.

Heart size normal. Basilar atelectasis. 7 mm nodule right lower
lobe, image number 11/series 202. No acute bony abnormality.
Degenerative changes lumbar spine and both hips. Punctate sclerotic
density in the L5 vertebral body, most likely bone island, no other
sclerotic lesion noted to suggest blastic metastatic disease.
IMPRESSION: 1. Hepatic contour is irregular. This suggests cirrhosis.
Serpiginous nodular densities are noted in the perigastric,
peripancreatic, perisplenic, and retroperitoneal regions suggesting
varices.
2. Right renal enlargement with right perirenal fat plane stranding
. Infectious or infiltrating right renal process exclude
pyelonephritis cannot be excluded. Small calcific density noted in
the right renal pelvis region. This is most likely renal vascular,
possibly calcification in a tiny 7 mm right renal artery aneurysm.
However tiny right renal pelvic stone cannot be completely excluded.
No hydronephrosis on the right is present . Foley catheter is in the
bladder. The bladder is nondistended.
3. 7 mm pulmonary nodule right lower lobe. If the patient is at high
risk for bronchogenic carcinoma, follow-up chest CT at 3-7months is
recommended. If the patient is at low risk for bronchogenic
carcinoma, follow-up chest CT at 6-12 months is recommended. This
recommendation follows the consensus statement: Guidelines for
Management of Small Pulmonary Nodules Detected on CT Scans: A
Statement from the [HOSPITAL] as published in Radiology

## 2015-09-29 ENCOUNTER — Telehealth: Payer: Self-pay | Admitting: Internal Medicine

## 2015-09-29 NOTE — Telephone Encounter (Signed)
Call to patient to confirm appointment for 09/30/15 at 2:15 lmtcb

## 2015-09-30 ENCOUNTER — Ambulatory Visit: Payer: Self-pay | Admitting: Internal Medicine

## 2015-10-04 ENCOUNTER — Telehealth: Payer: Self-pay

## 2015-10-04 NOTE — Telephone Encounter (Signed)
APPT REMINDER CALL/LMTCB IF SHE NEEDS TO CANCEL °

## 2015-10-05 ENCOUNTER — Ambulatory Visit (INDEPENDENT_AMBULATORY_CARE_PROVIDER_SITE_OTHER): Payer: Medicaid Other | Admitting: Internal Medicine

## 2015-10-05 VITALS — BP 165/101 | HR 54 | Temp 98.2°F | Ht 67.0 in | Wt 156.9 lb

## 2015-10-05 DIAGNOSIS — I10 Essential (primary) hypertension: Secondary | ICD-10-CM

## 2015-10-05 DIAGNOSIS — M722 Plantar fascial fibromatosis: Secondary | ICD-10-CM

## 2015-10-05 DIAGNOSIS — R911 Solitary pulmonary nodule: Secondary | ICD-10-CM

## 2015-10-05 DIAGNOSIS — R740 Nonspecific elevation of levels of transaminase and lactic acid dehydrogenase [LDH]: Secondary | ICD-10-CM

## 2015-10-05 DIAGNOSIS — R7401 Elevation of levels of liver transaminase levels: Secondary | ICD-10-CM

## 2015-10-05 NOTE — Assessment & Plan Note (Signed)
HPI: She reports bilateral heel pain, worse at night and early morning.  Has some tightness at bottom of her feet.  Has not tried any medications for the pain.  Rubbing the bottom of her feet does help.  No trauma to the area.  A: Plantar fasciitis, bilateral  P: Discussed the diagnosis.   Recommend OTC splint aids, use of tennis ball, and frozen water bottle.

## 2015-10-05 NOTE — Assessment & Plan Note (Signed)
HPI: Patient wishes to know her lab results.   A: Transaminitis  P: We discussed her abnormal LFTS, she may have some early cirrhosis likey due to ETOH and Hepatitis C.  She continues to drink and I recommended cessation.  I also discussed follow up lab work that is needed including a Hep C viral load, she did not have time today to give blood, will address at next visit.

## 2015-10-05 NOTE — Patient Instructions (Signed)
Pick up and take Lisinopril  Plantar Fasciitis Plantar fasciitis is a painful foot condition that affects the heel. It occurs when the band of tissue that connects the toes to the heel bone (plantar fascia) becomes irritated. This can happen after exercising too much or doing other repetitive activities (overuse injury). The pain from plantar fasciitis can range from mild irritation to severe pain that makes it difficult for you to walk or move. The pain is usually worse in the morning or after you have been sitting or lying down for a while. CAUSES This condition may be caused by:  Standing for long periods of time.  Wearing shoes that do not fit.  Doing high-impact activities, including running, aerobics, and ballet.  Being overweight.  Having an abnormal way of walking (gait).  Having tight calf muscles.  Having high arches in your feet.  Starting a new athletic activity. SYMPTOMS The main symptom of this condition is heel pain. Other symptoms include:  Pain that gets worse after activity or exercise.  Pain that is worse in the morning or after resting.  Pain that goes away after you walk for a few minutes. DIAGNOSIS This condition may be diagnosed based on your signs and symptoms. Your health care provider will also do a physical exam to check for:  A tender area on the bottom of your foot.  A high arch in your foot.  Pain when you move your foot.  Difficulty moving your foot. You may also need to have imaging studies to confirm the diagnosis. These can include:  X-rays.  Ultrasound.  MRI. TREATMENT  Treatment for plantar fasciitis depends on the severity of the condition. Your treatment may include:  Rest, ice, and over-the-counter pain medicines to manage your pain.  Exercises to stretch your calves and your plantar fascia.  A splint that holds your foot in a stretched, upward position while you sleep (night splint).  Physical therapy to relieve symptoms  and prevent problems in the future.  Cortisone injections to relieve severe pain.  Extracorporeal shock wave therapy (ESWT) to stimulate damaged plantar fascia with electrical impulses. It is often used as a last resort before surgery.  Surgery, if other treatments have not worked after 12 months. HOME CARE INSTRUCTIONS  Take medicines only as directed by your health care provider.  Avoid activities that cause pain.  Roll the bottom of your foot over a bag of ice or a bottle of cold water. Do this for 20 minutes, 3-4 times a day.  Perform simple stretches as directed by your health care provider.  Try wearing athletic shoes with air-sole or gel-sole cushions or soft shoe inserts.  Wear a night splint while sleeping, if directed by your health care provider.  Keep all follow-up appointments with your health care provider. PREVENTION   Do not perform exercises or activities that cause heel pain.  Consider finding low-impact activities if you continue to have problems.  Lose weight if you need to. The best way to prevent plantar fasciitis is to avoid the activities that aggravate your plantar fascia. SEEK MEDICAL CARE IF:  Your symptoms do not go away after treatment with home care measures.  Your pain gets worse.  Your pain affects your ability to move or do your daily activities.   This information is not intended to replace advice given to you by your health care provider. Make sure you discuss any questions you have with your health care provider.   Document Released: 05/08/2001 Document  Revised: 05/04/2015 Document Reviewed: 06/23/2014 Elsevier Interactive Patient Education [employer]

## 2015-10-05 NOTE — Assessment & Plan Note (Signed)
HPI: She has not picked up the lisinopril yet.  A: Essential Hypertension, not at goal  P:  - Asked that she pick up med, start taking and return in 2 weeks for recheck.

## 2015-10-05 NOTE — Progress Notes (Signed)
[facility] Subjective:   Patient ID: Dana Mcintosh female   DOB: [birth year] 65 y.o.   MRN: [id number]  HPI: [name] is a 65 y.o. female with a PMH detailed below who presents for 1 month follow up of HTN.  Please see problem based charting below for the status of her chronic medical problems.    Past Medical History  Diagnosis Date  . Hypertension   . ETOH abuse   . Substance abuse   . Mental disorder   . Depression   . Hepatitis C    Current Outpatient Prescriptions  Medication Sig Dispense Refill  . gabapentin (NEURONTIN) 300 MG capsule Take 1 capsule (300 mg total) by mouth 3 (three) times daily. For pain 90 capsule 3  . lisinopril (PRINIVIL,ZESTRIL) 10 MG tablet Take 1 tablet (10 mg total) by mouth daily. For high blood pressure 30 tablet 3  . pantoprazole (PROTONIX) 40 MG tablet Take 1 tablet (40 mg total) by mouth daily. 30 tablet 3  . traZODone (DESYREL) 50 MG tablet Take 1 tablet (50 mg total) by mouth at bedtime and may repeat dose one time if needed. For insomnia and depression 30 tablet 0   No current facility-administered medications for this visit.   Family History  Problem Relation Age of Onset  . Diabetes Mother   . Hypertension Mother   . Cancer Father    Social History   Social History  . Marital Status: Divorced    Spouse Name: N/A  . Number of Children: N/A  . Years of Education: N/A   Social History Main Topics  . Smoking status: Current Every Day Smoker -- 0.50 packs/day for 40 years    Types: Cigarettes  . Smokeless tobacco: Not on file  . Alcohol Use: 26.4 oz/week    24 Cans of beer, 20 Shots of liquor per week  . Drug Use: 7.00 per week    Special: Cocaine, Marijuana, "Crack" cocaine  . Sexual Activity: No   Other Topics Concern  . Not on file   Social History Narrative   Review of Systems: Review of Systems  Respiratory: Negative for cough, hemoptysis and shortness of breath.   Musculoskeletal:  Positive for joint pain.    Objective:  Physical Exam: Filed Vitals:   10/05/15 1529  BP: 165/101  Pulse: 54  Temp: 98.2 F (36.8 C)  TempSrc: Oral  Height:  (1.702 m)  Weight: 156 lb 14.4 oz (71.169 kg)  SpO2: 100%   Physical Exam  Constitutional: She is well-developed, well-nourished, and in no distress.  Pulmonary/Chest: Effort normal.  Musculoskeletal: Normal range of motion.  Tenderness at plantar aspect of her heel bilaterally.  Nursing note and vitals reviewed.   Assessment & Plan:  Case discussed with Dr. Criselda Peaches  Plantar fasciitis, bilateral HPI: She reports bilateral heel pain, worse at night and early morning.  Has some tightness at bottom of her feet.  Has not tried any medications for the pain.  Rubbing the bottom of her feet does help.  No trauma to the area.  A: Plantar fasciitis, bilateral  P: Discussed the diagnosis.   Recommend OTC splint aids, use of tennis ball, and frozen water bottle.  Essential hypertension, benign HPI: She has not picked up the lisinopril yet.  A: Essential Hypertension, not at goal  P:  - Asked that she pick up med, start taking and return in 2 weeks for recheck.  Solitary pulmonary nodule A: Solitary pulmonary nodule  P:  Repeat CT not discussed today or ordered due to time constraints, will plan to discuss at follow up in 2 weeks  Transaminitis HPI: Patient wishes to know her lab results.   A: Transaminitis  P: We discussed her abnormal LFTS, she may have some early cirrhosis likey due to ETOH and Hepatitis C.  She continues to drink and I recommended cessation.  I also discussed follow up lab work that is needed including a Hep C viral load, she did not have time today to give blood, will address at next visit.    Medications Ordered No orders of the defined types were placed in this encounter.   Other Orders No orders of the defined types were placed in this encounter.   Follow Up: Return in about 2 weeks  (around 10/19/2015).

## 2015-10-05 NOTE — Assessment & Plan Note (Signed)
A: Solitary pulmonary nodule  P:  Repeat CT not discussed today or ordered due to time constraints, will plan to discuss at follow up in 2 weeks

## 2015-10-11 NOTE — Progress Notes (Signed)
Internal Medicine Clinic Attending  Case discussed with Dr. Hoffman soon after the resident saw the patient.  We reviewed the resident's history and exam and pertinent patient test results.  I agree with the assessment, diagnosis, and plan of care documented in the resident's note. 

## 2015-11-03 ENCOUNTER — Encounter: Payer: Self-pay | Admitting: Internal Medicine

## 2015-11-03 ENCOUNTER — Ambulatory Visit (INDEPENDENT_AMBULATORY_CARE_PROVIDER_SITE_OTHER): Payer: Medicaid Other | Admitting: Internal Medicine

## 2015-11-03 VITALS — BP 164/107 | HR 53 | Temp 97.7°F | Ht 67.0 in | Wt 155.1 lb

## 2015-11-03 DIAGNOSIS — I10 Essential (primary) hypertension: Secondary | ICD-10-CM

## 2015-11-03 DIAGNOSIS — R768 Other specified abnormal immunological findings in serum: Secondary | ICD-10-CM

## 2015-11-03 DIAGNOSIS — R894 Abnormal immunological findings in specimens from other organs, systems and tissues: Secondary | ICD-10-CM | POA: Diagnosis present

## 2015-11-03 DIAGNOSIS — F17200 Nicotine dependence, unspecified, uncomplicated: Secondary | ICD-10-CM | POA: Insufficient documentation

## 2015-11-03 DIAGNOSIS — M549 Dorsalgia, unspecified: Secondary | ICD-10-CM | POA: Diagnosis not present

## 2015-11-03 DIAGNOSIS — R911 Solitary pulmonary nodule: Secondary | ICD-10-CM

## 2015-11-03 DIAGNOSIS — T148XXA Other injury of unspecified body region, initial encounter: Secondary | ICD-10-CM

## 2015-11-03 DIAGNOSIS — F17210 Nicotine dependence, cigarettes, uncomplicated: Secondary | ICD-10-CM

## 2015-11-03 MED ORDER — NICOTINE 14 MG/24HR TD PT24: 14.0000 mg | MEDICATED_PATCH | TRANSDERMAL | Status: DC

## 2015-11-03 MED ORDER — LISINOPRIL-HYDROCHLOROTHIAZIDE 10-12.5 MG PO TABS
1.0000 | ORAL_TABLET | Freq: Every day | ORAL | Status: DC
Start: 2015-11-03 — End: 2016-05-26

## 2015-11-03 MED ORDER — MELOXICAM 7.5 MG PO TABS: 7.5000 mg | ORAL_TABLET | Freq: Every day | ORAL | Status: AC

## 2015-11-03 MED ORDER — LISINOPRIL-HYDROCHLOROTHIAZIDE 10-12.5 MG PO TABS: 1.0000 | ORAL_TABLET | Freq: Every day | ORAL | Status: DC

## 2015-11-03 MED FILL — LISINOPRIL-HCTZ 10-12.5 MG: 10-12.5 | 30 days supply | Qty: 30 | Fill #0

## 2015-11-03 NOTE — Assessment & Plan Note (Signed)
The 7 mm solitary pulmonary nodule incidentally noted in her right lower lobe in 2015 was never followed up. Given her extensive smoking history of over 30 pack years, I strongly emphasized the importance of getting a CT chest; I placed that order today. She does not have any axillary or cervical lymphadenopathy, denies any B symptoms, cough, or hemoptysis. She does continue to smoke and is very interested in quitting so I've presrcibed her a patch today.

## 2015-11-03 NOTE — Patient Instructions (Addendum)
[name],  It was great to meet you today.  We discussed a few things:  1. I think your hip pain is probably a muscle strain so I've given you a strong anti-inflammatory called meloxicam to take for one week. If it doesn't get any better in the next couple weeks, we can get an X-ray.  2. For your hepatitis C, I've referred you to infectious disease. They can treat you with Harvoni for free.  3. For the nodule in your lung, this may be cancer but we need to check another CT scan to be sure. I've placed the referral.  4. Please come back tomorrow so the pharmacists can help you get medications for free.  Take care and we'll see you in 2 weeks as well, Dr. Earnest ConroyFlores

## 2015-11-03 NOTE — Progress Notes (Signed)
Medicine attending: Medical history, presenting problems, physical findings, and medications, reviewed with resident physician Dr Kyle Flores on the day of the patient visit and I concur with his evaluation and management plan. 

## 2015-11-03 NOTE — Assessment & Plan Note (Signed)
Her hepatitis C antibody was positive in January 2017 and cirrhosis as noted on abdominal CT back in 2015. Today, I checked HCV RNA level, an HIV antibody,and hepatitis B serologies. I placed a referral to infectious disease to get her plugged in as soon as possible as she seemed very agreeable to establishing more consistently with her providers.

## 2015-11-03 NOTE — Assessment & Plan Note (Signed)
She continues to smoke half pack of cigarettes daily, and has been smoking for 40 years. She is interested in quitting, and would like to try the patch so I prescribed that today. I spent 5 minutes counseling on tobacco cessation and pharmacologic options.

## 2015-11-03 NOTE — Addendum Note (Signed)
Addended by: Mliss FritzKIM, Kowen Kluth J on: 11/03/2015 02:59 PM   Modules accepted: Orders, Medications

## 2015-11-03 NOTE — Assessment & Plan Note (Signed)
She continues to be hypertensive today to 160/110; she had not been taking lisinopril because it was too expensive. Today, I have changed her lisinopril to lisinopril 10 mg and hydrochlorothiazide 12.5 mg combination pill, as I suspect she will need the additional thiazide. Pharmacy will meet with her tomorrow to get her medications for free.

## 2015-11-03 NOTE — Progress Notes (Signed)
Patient ID: Dana Mcintosh, female   DOB: 05-Jul-1951, 65 y.o.   MRN: [id number] [facility] Subjective:   Patient ID: Dana Mcintosh female   DOB: 12-09-1950 65 y.o.   MRN: [id number]  HPI: Ms.[name] is a 65 y.o. female with hypertension, solitary pulmonary nodule, hepatitis C antibody positive, tobacco use disorder, and crack/cocaine use presenting to clinic for follow-up of hypertension and hepatitis C antibody positive.  Solitary pulmonary nodule: In August 2015, she had an incidentally noted 7 mm pulmonary nodule in the right lower lobe. A repeat CT was recommended and 6 months, but this was never done. She has a 30-pack-year smoking history. She denies any night sweats, cough, or unintentional weight loss today. I strongly emphasized the importance of getting this repeat chest CT.  Hepatitis C antibody positive: She was noted to be hepatitis C antibody positive back in January 2017, and cirrhosis was noted on abdominal CT in 2015. She denies any ascites, yellowing of the eyes, or dark urine. Again I emphasized the importance of making her infectious disease important.  Hypertension: She has not been taking her lisinopril 10 mg daily because she says she can't afford it. She denies any headaches or vision changes. She is very interested in speaking to pharmacy, who will meet with her tomorrow to get her medications for free.  She continues to smoke half a pack of cigarettes daily and I have reviewed her medications with her today.  Review of Systems  Constitutional: Negative for fever, chills, weight loss and malaise/fatigue.  Respiratory: Negative for cough, hemoptysis and shortness of breath.   Cardiovascular: Negative for chest pain and orthopnea.  Genitourinary: Negative for dysuria, urgency, frequency and hematuria.  Musculoskeletal: Positive for back pain.  Skin: Negative for itching and rash.  Neurological: Negative for headaches.    Objective:   Physical Exam: Filed Vitals:   11/03/15 0914  BP: 164/107  Pulse: 53  Temp: 97.7 F (36.5 C)  TempSrc: Oral  Height:  (1.702 m)  Weight: 155 lb 1.6 oz (70.353 kg)  SpO2: 100%   General: resting in chair comfortably, appropriately conversational,  HEENT: no scleral icterus, extra-ocular muscles intact, oropharynx without lesions Cardiac: regular rate and rhythm, no rubs, murmurs or gallops Pulm: breathing well, clear to auscultation bilaterally Abd: bowel sounds normal, soft, nondistended, non-tender, no organomegaly Ext: warm and well perfused, without pedal edema MSK: no point tenderness over spine, strength 5/5 throughout Lymph: no cervical or supraclavicular lymphadenopathy Skin: no rash, hair, or nail changes  Assessment & Plan:  Case discussed with Dr. Cyndie Chime  Solitary pulmonary nodule The 7 mm solitary pulmonary nodule incidentally noted in her right lower lobe in 2015 was never followed up. Given her extensive smoking history of over 30 pack years, I strongly emphasized the importance of getting a CT chest; I placed that order today. She does not have any axillary or cervical lymphadenopathy, denies any B symptoms, cough, or hemoptysis. She does continue to smoke and is very interested in quitting so I've presrcibed her a patch today.  Hepatitis C antibody test positive Her hepatitis C antibody was positive in January 2017 and cirrhosis as noted on abdominal CT back in 2015. Today, I checked HCV RNA level, an HIV antibody,and hepatitis B serologies. I placed a referral to infectious disease to get her plugged in as soon as possible as she seemed very agreeable to establishing more consistently with her providers.  Tobacco use disorder She continues to  smoke half pack of cigarettes daily, and has been smoking for 40 years. She is interested in quitting, and would like to try the patch so I prescribed that today. I spent 5 minutes counseling on tobacco cessation and  pharmacologic options.  Muscle strain She complains of dull aching paraspinal tenderness, without any red flags such as fevers, night sweats, night pain, weakness, or point tenderness. I prescribed meloxicam for one week, and advised her to avoid activities that worsen the pain. Should she continue to have pain, think we should consider x-ray of her lumbar spine and hips to confidently rule out metastatic disease given her solitary pulmonary nodule and extensive smoking history.  Essential hypertension, benign She continues to be hypertensive today to 160/110; she had not been taking lisinopril because it was too expensive. Today, I have changed her lisinopril to lisinopril 10 mg and hydrochlorothiazide 12.5 mg combination pill, as I suspect she will need the additional thiazide. Pharmacy will meet with her tomorrow to get her medications for free.    Medications Ordered Meds ordered this encounter  Medications  . meloxicam (MOBIC) 7.5 MG tablet    Sig: Take 1 tablet (7.5 mg total) by mouth daily.    Dispense:  10 tablet    Refill:  0  . nicotine (NICODERM CQ - DOSED IN MG/24 HOURS) 14 mg/24hr patch    Sig: Place 1 patch (14 mg total) onto the skin daily.    Dispense:  30 patch    Refill:  0  . lisinopril-hydrochlorothiazide (ZESTORETIC) 10-12.5 MG tablet    Sig: Take 1 tablet by mouth daily.    Dispense:  30 tablet    Refill:  11   Other Orders Orders Placed This Encounter  Procedures  . CT Chest Wo Contrast    Standing Status: Future     Number of Occurrences:      Standing Expiration Date: 01/02/2017    Order Specific Question:  Reason for Exam (SYMPTOM  OR DIAGNOSIS REQUIRED)    Answer:  7mm SPN in 2015, 30 pack year smoking history    Order Specific Question:  [facility]?    [facility]  . HCV RNA quant  . HIV antibody (with reflex)  . Hepatitis B Surface Antigen  . Hepatitis B Surface Antibody  . Hepatitis B core Ab, Total  . CMP14 +  Anion Gap  . Ambulatory referral to Infectious Disease    Referral Priority:  Routine    Referral Type:  Consultation    Referral Reason:  Specialty Services Required    Requested Specialty:  Infectious Diseases    Number of Visits Requested:  1   Follow Up: Return in about 2 weeks (around 11/17/2015).

## 2015-11-03 NOTE — Assessment & Plan Note (Signed)
She complains of dull aching paraspinal tenderness, without any red flags such as fevers, night sweats, night pain, weakness, or point tenderness. I prescribed meloxicam for one week, and advised her to avoid activities that worsen the pain. Should she continue to have pain, think we should consider x-ray of her lumbar spine and hips to confidently rule out metastatic disease given her solitary pulmonary nodule and extensive smoking history.

## 2015-11-04 LAB — CMP14 + ANION GAP
ALBUMIN: 3.7 g/dL (ref 3.6–4.8)
ALT: 85 IU/L — ABNORMAL HIGH (ref 0–32)
ANION GAP: 17 mmol/L (ref 10.0–18.0)
AST: 130 IU/L — AB (ref 0–40)
Albumin/Globulin Ratio: 0.8 — ABNORMAL LOW (ref 1.1–2.5)
Alkaline Phosphatase: 91 IU/L (ref 39–117)
BUN / CREAT RATIO: 12 (ref 11–26)
BUN: 9 mg/dL (ref 8–27)
Bilirubin Total: 0.8 mg/dL (ref 0.0–1.2)
CALCIUM: 9.3 mg/dL (ref 8.7–10.3)
CO2: 23 mmol/L (ref 18–29)
CREATININE: 0.78 mg/dL (ref 0.57–1.00)
Chloride: 101 mmol/L (ref 96–106)
GFR, EST AFRICAN AMERICAN: 94 mL/min/{1.73_m2} (ref >59)
GFR, EST NON AFRICAN AMERICAN: 82 mL/min/{1.73_m2} (ref >59)
GLOBULIN, TOTAL: 4.6 g/dL — AB (ref 1.5–4.5)
Glucose: 98 mg/dL (ref 65–99)
Potassium: 3.7 mmol/L (ref 3.5–5.2)
SODIUM: 141 mmol/L (ref 134–144)
TOTAL PROTEIN: 8.3 g/dL (ref 6.0–8.5)

## 2015-11-04 LAB — HEPATITIS B CORE ANTIBODY, TOTAL: Hep B Core Total Ab: POSITIVE — AB

## 2015-11-04 LAB — HIV ANTIBODY (ROUTINE TESTING W REFLEX): HIV Screen 4th Generation wRfx: NONREACTIVE

## 2015-11-04 LAB — HEPATITIS B SURFACE ANTIBODY,QUALITATIVE: Hep B Surface Ab, Qual: NONREACTIVE

## 2015-11-04 LAB — HEPATITIS B SURFACE ANTIGEN: Hepatitis B Surface Ag: NEGATIVE

## 2015-11-05 LAB — HCV RNA QUANT

## 2015-11-05 LAB — HCV RNA (INTERNATIONAL UNITS)
HCV RNA (INTERNATIONAL UNITS): 12100000 [IU]/mL
HCV log10: 7.083 log10 IU/mL

## 2015-11-17 ENCOUNTER — Encounter: Payer: Self-pay | Admitting: Internal Medicine

## 2015-11-17 ENCOUNTER — Ambulatory Visit: Payer: Self-pay | Admitting: Internal Medicine

## 2015-11-23 ENCOUNTER — Ambulatory Visit (HOSPITAL_COMMUNITY): Payer: Medicaid Other

## 2015-12-01 ENCOUNTER — Ambulatory Visit (HOSPITAL_COMMUNITY): Payer: Medicaid Other

## 2015-12-06 ENCOUNTER — Other Ambulatory Visit: Payer: Self-pay

## 2015-12-06 DIAGNOSIS — R768 Other specified abnormal immunological findings in serum: Secondary | ICD-10-CM

## 2015-12-06 DIAGNOSIS — Z23 Encounter for immunization: Secondary | ICD-10-CM

## 2015-12-07 ENCOUNTER — Other Ambulatory Visit: Payer: Self-pay

## 2015-12-09 ENCOUNTER — Ambulatory Visit (HOSPITAL_COMMUNITY): Admission: RE | Admit: 2015-12-09 | Payer: Medicaid Other | Source: Ambulatory Visit

## 2015-12-14 ENCOUNTER — Other Ambulatory Visit: Payer: Self-pay

## 2015-12-15 ENCOUNTER — Other Ambulatory Visit: Payer: Medicaid Other

## 2015-12-15 DIAGNOSIS — B182 Chronic viral hepatitis C: Secondary | ICD-10-CM

## 2015-12-15 LAB — PROTIME-INR
INR: 1.11 (ref <1.50)
Prothrombin Time: 14.4 seconds (ref 11.6–15.2)

## 2015-12-15 LAB — IRON: IRON: 151 ug/dL (ref 45–160)

## 2015-12-16 LAB — ANA: Anti Nuclear Antibody(ANA): NEGATIVE

## 2015-12-16 LAB — HEPATITIS A ANTIBODY, TOTAL: Hep A Total Ab: REACTIVE — AB

## 2015-12-16 LAB — HIV ANTIBODY (ROUTINE TESTING W REFLEX): HIV 1&2 Ab, 4th Generation: NONREACTIVE

## 2015-12-19 LAB — HCV RNA,LIPA RFLX NS5A DRUG RESIST

## 2015-12-21 LAB — HCV RNA NS5A DRUG RESISTANCE

## 2015-12-28 ENCOUNTER — Encounter: Payer: Self-pay | Admitting: Internal Medicine

## 2016-01-03 NOTE — Addendum Note (Signed)
Addended by: Neomia DearPOWERS, Janie Strothman E on: 01/03/2016 08:45 AM   Modules accepted: Orders

## 2016-05-26 ENCOUNTER — Encounter (HOSPITAL_COMMUNITY): Payer: Self-pay | Admitting: Vascular Surgery

## 2016-05-26 ENCOUNTER — Emergency Department (HOSPITAL_COMMUNITY)
Admission: EM | Admit: 2016-05-26 | Discharge: 2016-05-26 | Disposition: A | Discharge status: Home / Self Care | Payer: Medicaid Other | Attending: Emergency Medicine | Admitting: Emergency Medicine

## 2016-05-26 ENCOUNTER — Emergency Department (HOSPITAL_COMMUNITY): Payer: Medicaid Other

## 2016-05-26 DIAGNOSIS — F17210 Nicotine dependence, cigarettes, uncomplicated: Secondary | ICD-10-CM | POA: Insufficient documentation

## 2016-05-26 DIAGNOSIS — I10 Essential (primary) hypertension: Secondary | ICD-10-CM | POA: Diagnosis not present

## 2016-05-26 DIAGNOSIS — R079 Chest pain, unspecified: Secondary | ICD-10-CM | POA: Insufficient documentation

## 2016-05-26 DIAGNOSIS — Z79899 Other long term (current) drug therapy: Secondary | ICD-10-CM | POA: Diagnosis not present

## 2016-05-26 DIAGNOSIS — R1031 Right lower quadrant pain: Secondary | ICD-10-CM

## 2016-05-26 DIAGNOSIS — R1032 Left lower quadrant pain: Secondary | ICD-10-CM

## 2016-05-26 LAB — URINALYSIS, ROUTINE W REFLEX MICROSCOPIC
BILIRUBIN URINE: NEGATIVE
Glucose, UA: NEGATIVE mg/dL
Hgb urine dipstick: NEGATIVE
KETONES UR: NEGATIVE mg/dL
NITRITE: NEGATIVE
PH: 8 (ref 5.0–8.0)
PROTEIN: 30 mg/dL — AB
Specific Gravity, Urine: 1.013 (ref 1.005–1.030)

## 2016-05-26 LAB — COMPREHENSIVE METABOLIC PANEL
ALBUMIN: 3.2 g/dL — AB (ref 3.5–5.0)
ALK PHOS: 68 U/L (ref 38–126)
ALT: 104 U/L — AB (ref 14–54)
AST: 129 U/L — ABNORMAL HIGH (ref 15–41)
Anion gap: 7 (ref 5–15)
BUN: 6 mg/dL (ref 6–20)
CALCIUM: 8.8 mg/dL — AB (ref 8.9–10.3)
CHLORIDE: 104 mmol/L (ref 101–111)
CO2: 26 mmol/L (ref 22–32)
CREATININE: 0.67 mg/dL (ref 0.44–1.00)
GFR calc non Af Amer: >60 mL/min (ref >60)
GLUCOSE: 119 mg/dL — AB (ref 65–99)
Potassium: 3.4 mmol/L — ABNORMAL LOW (ref 3.5–5.1)
SODIUM: 137 mmol/L (ref 135–145)
Total Bilirubin: 0.9 mg/dL (ref 0.3–1.2)
Total Protein: 7.8 g/dL (ref 6.5–8.1)

## 2016-05-26 LAB — I-STAT TROPONIN, ED: TROPONIN I, POC: 0.01 ng/mL (ref 0.00–0.08)

## 2016-05-26 LAB — CBC
HCT: 36 % (ref 36.0–46.0)
HEMOGLOBIN: 12.5 g/dL (ref 12.0–15.0)
MCH: 33.4 pg (ref 26.0–34.0)
MCHC: 34.7 g/dL (ref 30.0–36.0)
MCV: 96.3 fL (ref 78.0–100.0)
PLATELETS: 104 10*3/uL — AB (ref 150–400)
RBC: 3.74 MIL/uL — AB (ref 3.87–5.11)
RDW: 14.5 % (ref 11.5–15.5)
WBC: 9.1 10*3/uL (ref 4.0–10.5)

## 2016-05-26 LAB — ETHANOL

## 2016-05-26 LAB — RAPID URINE DRUG SCREEN, HOSP PERFORMED
Amphetamines: NOT DETECTED
Barbiturates: NOT DETECTED
Benzodiazepines: NOT DETECTED
COCAINE: POSITIVE — AB
OPIATES: POSITIVE — AB
Tetrahydrocannabinol: POSITIVE — AB

## 2016-05-26 LAB — URINE MICROSCOPIC-ADD ON: RBC / HPF: NONE SEEN RBC/hpf (ref 0–5)

## 2016-05-26 LAB — MAGNESIUM: MAGNESIUM: 1.8 mg/dL (ref 1.7–2.4)

## 2016-05-26 LAB — LIPASE, BLOOD: Lipase: 40 U/L (ref 11–51)

## 2016-05-26 LAB — PROTIME-INR
INR: 1.21
PROTHROMBIN TIME: 15.4 s — AB (ref 11.4–15.2)

## 2016-05-26 LAB — TROPONIN I

## 2016-05-26 MED ORDER — SODIUM CHLORIDE 0.9 % IV BOLUS (SEPSIS)
1000.0000 mL | Freq: Once | INTRAVENOUS | Status: AC
  Administered 2016-05-26: 1000 mL via INTRAVENOUS

## 2016-05-26 MED ORDER — ONDANSETRON HCL 4 MG/2ML IJ SOLN
4.0000 mg | Freq: Once | INTRAMUSCULAR | Status: AC
  Administered 2016-05-26: 4 mg via INTRAVENOUS
  Filled 2016-05-26: qty 2

## 2016-05-26 MED ORDER — LISINOPRIL-HYDROCHLOROTHIAZIDE 10-12.5 MG PO TABS: 1.0000 | ORAL_TABLET | Freq: Every day | ORAL | 0 refills | Status: DC

## 2016-05-26 MED ORDER — FAMOTIDINE IN NACL 20-0.9 MG/50ML-% IV SOLN
20.0000 mg | Freq: Once | INTRAVENOUS | Status: AC
  Administered 2016-05-26: 20 mg via INTRAVENOUS
  Filled 2016-05-26: qty 50

## 2016-05-26 MED ORDER — GABAPENTIN 300 MG PO CAPS: 300.0000 mg | ORAL_CAPSULE | Freq: Three times a day (TID) | ORAL | 0 refills | Status: DC

## 2016-05-26 MED ORDER — MORPHINE SULFATE (PF) 4 MG/ML IV SOLN
4.0000 mg | Freq: Once | INTRAVENOUS | Status: AC
  Administered 2016-05-26: 4 mg via INTRAVENOUS
  Filled 2016-05-26: qty 1

## 2016-05-26 NOTE — ED Notes (Signed)
Notified Jeraldine LootsLockwood MD of completed labs.

## 2016-05-26 NOTE — Discharge Instructions (Signed)
As discussed, your evaluation today has been largely reassuring.  But, it is important that you monitor your condition carefully, and do not hesitate to return to the ED if you develop new, or concerning changes in your condition. ? ?Otherwise, please follow-up with your physician for appropriate ongoing care. ? ?

## 2016-05-26 NOTE — ED Notes (Signed)
ALL LABS REDRAWN AT THIS TIME.

## 2016-05-26 NOTE — ED Provider Notes (Signed)
[facility]-EMERGENCY DEPT Provider Note   CSN: [id number] Arrival date & time: 05/26/16  1413     History   Chief Complaint Chief Complaint  Patient presents with  . Chest Pain    HPI Dana Mcintosh is a 65 y.o. female.  Pt presents from home with chest pain.  The pt also has n/v.  She said that it developed after drinking "white liquor" and crack.  The pt describes it as burning and points to her epigastrium as where the pain is located.      Past Medical History:  Diagnosis Date  . Depression   . ETOH abuse   . Hepatitis C   . Hypertension   . Mental disorder   . Substance abuse     Patient Active Problem List   Diagnosis Date Noted  . Tobacco use disorder 11/03/2015  . Muscle strain 11/03/2015  . Plantar fasciitis, bilateral 10/05/2015  . Transaminitis 10/05/2015  . Polysubstance abuse 08/30/2015  . Essential hypertension, benign 08/30/2015  . Solitary pulmonary nodule 08/30/2015  . GERD (gastroesophageal reflux disease) 08/30/2015  . Healthcare maintenance 08/30/2015  . Alcohol dependence (HCC) 06/17/2014  . Severe major depression with psychotic features (HCC) 06/17/2014  . Hepatitis C antibody test positive 04/02/2014  . Cirrhosis (HCC) 04/02/2014  . Cocaine abuse 07/30/2012    Class: Acute  . Cannabis abuse 07/30/2012    Class: Acute    Past Surgical History:  Procedure Laterality Date  . CHOLECYSTECTOMY    . TUBAL LIGATION      OB History    No data available       Home Medications    Prior to Admission medications   Medication Sig Start Date End Date Taking? Authorizing Provider  gabapentin (NEURONTIN) 300 MG capsule Take 1 capsule (300 mg total) by mouth 3 (three) times daily. For pain 05/26/16   Gerhard Munchobert Lockwood, MD  lisinopril-hydrochlorothiazide (ZESTORETIC) 10-12.5 MG tablet Take 1 tablet by mouth daily. 05/26/16 05/26/17  Gerhard Munchobert Lockwood, MD  meloxicam (MOBIC) 7.5 MG tablet Take 1 tablet (7.5 mg total) by mouth daily. 11/03/15 11/02/16  Selina CooleyKyle  Flores, MD  nicotine (NICODERM CQ - DOSED IN MG/24 HOURS) 14 mg/24hr patch Place 1 patch (14 mg total) onto the skin daily. 11/03/15   Selina CooleyKyle Flores, MD  pantoprazole (PROTONIX) 40 MG tablet Take 1 tablet (40 mg total) by mouth daily. 08/30/15   Jana HalfNicholas A Taylor, MD    Family History Family History  Problem Relation Age of Onset  . Diabetes Mother   . Hypertension Mother   . Cancer Father     Social History Social History  Substance Use Topics  . Smoking status: Current Every Day Smoker    Packs/day: 0.50    Years: 40.00    Types: Cigarettes  . Smokeless tobacco: Never Used  . Alcohol use 26.4 oz/week    24 Cans of beer, 20 Shots of liquor per week     Allergies   Penicillins   Review of Systems Review of Systems  Gastrointestinal: Positive for abdominal pain.  All other systems reviewed and are negative.    Physical Exam Updated Vital Signs BP 162/97   Pulse (!) 57   Temp 97.7 F (36.5 C) (Oral)   Resp 14   Ht 5\' 7"  (1.702 m)   Wt 140 lb (63.5 kg)   LMP 07/27/1997   SpO2 95%   BMI 21.93 kg/m   Physical Exam  Constitutional: She is oriented to person, place, and time. She  appears well-developed and well-nourished.  HENT:  Head: Normocephalic and atraumatic.  Right Ear: External ear normal.  Left Ear: External ear normal.  Nose: Nose normal.  Mouth/Throat: Oropharynx is clear and moist.  Eyes: Conjunctivae and EOM are normal. Pupils are equal, round, and reactive to light.  Neck: Normal range of motion. Neck supple.  Cardiovascular: Regular rhythm, normal heart sounds and intact distal pulses.  Bradycardia present.   Pulmonary/Chest: Effort normal and breath sounds normal.  Abdominal: Soft. Bowel sounds are normal.  Musculoskeletal: Normal range of motion.  Neurological: She is alert and oriented to person, place, and time.  Skin: Skin is warm and dry.  Psychiatric: She has a normal mood and affect. Her behavior is normal. Judgment and thought content  normal.  Nursing note and vitals reviewed.    ED Treatments / Results  Labs (all labs ordered are listed, but only abnormal results are displayed) Labs Reviewed  URINALYSIS, ROUTINE W REFLEX MICROSCOPIC (NOT AT [facility]) - Abnormal; Notable for the following:       Result Value   Protein, ur 30 (*)    Leukocytes, UA TRACE (*)    All other components within normal limits  URINE RAPID DRUG SCREEN, HOSP PERFORMED - Abnormal; Notable for the following:    Opiates POSITIVE (*)    Cocaine POSITIVE (*)    Tetrahydrocannabinol POSITIVE (*)    All other components within normal limits  URINE MICROSCOPIC-ADD ON - Abnormal; Notable for the following:    Squamous Epithelial / LPF 0-5 (*)    Bacteria, UA RARE (*)    All other components within normal limits  CBC - Abnormal; Notable for the following:    RBC 3.74 (*)    Platelets 104 (*)    All other components within normal limits  COMPREHENSIVE METABOLIC PANEL - Abnormal; Notable for the following:    Potassium 3.4 (*)    Glucose, Bld 119 (*)    Calcium 8.8 (*)    Albumin 3.2 (*)    AST 129 (*)    ALT 104 (*)    All other components within normal limits  PROTIME-INR - Abnormal; Notable for the following:    Prothrombin Time 15.4 (*)    All other components within normal limits  ETHANOL  LIPASE, BLOOD  MAGNESIUM  TROPONIN I  I-STAT TROPOININ, ED    EKG  EKG Interpretation  Date/Time:  Saturday May 26 2016 14:17:23 EDT Ventricular Rate:  58 PR Interval:    QRS Duration: 86 QT Interval:  546 QTC Calculation: 537 R Axis:   58 Text Interpretation:  Sinus rhythm Probable left atrial enlargement Left ventricular hypertrophy Anterior Q waves, possibly due to LVH Prolonged QT interval Confirmed by Particia Nearing MD, Mariah Gerstenberger ([id number]) on 05/26/2016 4:46:55 PM       Radiology Dg Chest 2 View  Result Date: 05/26/2016 CLINICAL DATA:  65 year old female with gradual onset of burning chest pain and nausea since 11 a.m. Some associated  shortness of breath. EXAM: CHEST  2 VIEW COMPARISON:  Chest x-ray they 07/28/2012. FINDINGS: Lung volumes are normal. No consolidative airspace disease. No pleural effusions. No evidence of pulmonary edema. Heart size appears borderline enlarged. The patient is rotated to the right on today's exam, resulting in distortion of the mediastinal contours and reduced diagnostic sensitivity and specificity for mediastinal pathology. Aortic atherosclerosis. Old healed fracture of the lateral left seventh rib is unchanged. IMPRESSION: 1. No radiographic evidence of acute cardiopulmonary disease. 2. Heart size is borderline enlarged. 3.  Aortic atherosclerosis. Electronically Signed   By: Trudie Reed M.D.   On: 05/26/2016 15:21    Procedures Procedures (including critical care time)  Medications Ordered in ED Medications  sodium chloride 0.9 % bolus 1,000 mL (0 mLs Intravenous Stopped 05/26/16 1842)  morphine 4 MG/ML injection 4 mg (4 mg Intravenous Given 05/26/16 1429)  ondansetron (ZOFRAN) injection 4 mg (4 mg Intravenous Given 05/26/16 1429)  famotidine (PEPCID) IVPB 20 mg premix (0 mg Intravenous Stopped 05/26/16 1520)     Initial Impression / Assessment and Plan / ED Course  I have reviewed the triage vital signs and the nursing notes.  Pertinent labs & imaging results that were available during my care of the patient were reviewed by me and considered in my medical decision making (see chart for details).  Clinical Course    Labs pending at shift change so pt will be signed out to Dr. Jeraldine Loots.  Final Clinical Impressions(s) / ED Diagnoses   Final diagnoses:  Chest pain, unspecified chest pain type    New Prescriptions Discharge Medication List as of 05/26/2016  9:55 PM       Jacalyn Lefevre, MD 05/27/16 0830

## 2016-05-26 NOTE — ED Triage Notes (Signed)
Pt reports to the ED via [facility] from home. Pt reports she had a gradual onset of burning CP and nausea N/V that began approx 11 am. Pt did endorse some white liquor and crack cocaine today prior to the pain onset. Pt has some associated SOB, worsening pain with breathing, and pain to palpation of chest. Pt denies any hx of pancreatitis. 12 lead en route unremarkable. Did not have any ASA and nitro en route.

## 2016-05-26 NOTE — ED Notes (Signed)
Dr. Jeraldine LootsLockwood updated on delay in bloodwork.

## 2016-05-26 NOTE — ED Notes (Signed)
Assisted patient with bedpan upon her request, pt is ambulatory. See I&O flowsheet.

## 2016-07-16 ENCOUNTER — Telehealth: Payer: Self-pay | Admitting: Internal Medicine

## 2016-07-16 NOTE — Telephone Encounter (Signed)
APT. REMINDER CALL, WRONG NUMBER IN SYSTEM °

## 2016-07-17 ENCOUNTER — Encounter: Payer: Self-pay | Admitting: Internal Medicine

## 2016-07-23 ENCOUNTER — Telehealth: Payer: Self-pay

## 2016-07-23 NOTE — Telephone Encounter (Signed)
Patient called to report that she is spotting scant amount of blood in panties and when she wipes reports urine is dark and cloudy and she is having chills. Appointment scheduled for 07/24/2016 845 am

## 2016-07-24 ENCOUNTER — Ambulatory Visit (INDEPENDENT_AMBULATORY_CARE_PROVIDER_SITE_OTHER): Payer: Medicaid Other | Admitting: Internal Medicine

## 2016-07-24 ENCOUNTER — Other Ambulatory Visit (HOSPITAL_COMMUNITY)
Admission: RE | Admit: 2016-07-24 | Discharge: 2016-07-24 | Disposition: A | Discharge status: Home / Self Care | Payer: Medicaid Other | Source: Ambulatory Visit | Attending: Internal Medicine | Admitting: Internal Medicine

## 2016-07-24 VITALS — BP 119/75 | HR 63 | Temp 97.5°F | Ht 67.0 in | Wt 141.7 lb

## 2016-07-24 DIAGNOSIS — F17210 Nicotine dependence, cigarettes, uncomplicated: Secondary | ICD-10-CM | POA: Diagnosis not present

## 2016-07-24 DIAGNOSIS — I10 Essential (primary) hypertension: Secondary | ICD-10-CM | POA: Diagnosis not present

## 2016-07-24 DIAGNOSIS — Z8619 Personal history of other infectious and parasitic diseases: Secondary | ICD-10-CM

## 2016-07-24 DIAGNOSIS — A599 Trichomoniasis, unspecified: Secondary | ICD-10-CM | POA: Diagnosis not present

## 2016-07-24 DIAGNOSIS — Z1151 Encounter for screening for human papillomavirus (HPV): Secondary | ICD-10-CM | POA: Insufficient documentation

## 2016-07-24 DIAGNOSIS — B9689 Other specified bacterial agents as the cause of diseases classified elsewhere: Secondary | ICD-10-CM

## 2016-07-24 DIAGNOSIS — R1013 Epigastric pain: Secondary | ICD-10-CM | POA: Diagnosis not present

## 2016-07-24 DIAGNOSIS — R1084 Generalized abdominal pain: Secondary | ICD-10-CM | POA: Diagnosis not present

## 2016-07-24 DIAGNOSIS — N760 Acute vaginitis: Secondary | ICD-10-CM

## 2016-07-24 DIAGNOSIS — R102 Pelvic and perineal pain: Secondary | ICD-10-CM

## 2016-07-24 DIAGNOSIS — Z7251 High risk heterosexual behavior: Secondary | ICD-10-CM

## 2016-07-24 DIAGNOSIS — Z79899 Other long term (current) drug therapy: Secondary | ICD-10-CM | POA: Diagnosis not present

## 2016-07-24 DIAGNOSIS — N898 Other specified noninflammatory disorders of vagina: Secondary | ICD-10-CM

## 2016-07-24 MED ORDER — LISINOPRIL-HYDROCHLOROTHIAZIDE 10-12.5 MG PO TABS: 1.0000 | ORAL_TABLET | Freq: Every day | ORAL | 0 refills | Status: DC

## 2016-07-24 MED ORDER — OMEPRAZOLE 20 MG PO CPDR: 20.0000 mg | DELAYED_RELEASE_CAPSULE | Freq: Every day | ORAL | 0 refills | Status: DC

## 2016-07-24 MED FILL — LISINOPRIL-HCTZ 10-12.5 MG: 10-12.5 | 30 days supply | Qty: 30 | Fill #0

## 2016-07-24 MED FILL — OMEPRAZOLE DR 20 MG CAPSULE: 20 | 30 days supply | Qty: 30 | Fill #0

## 2016-07-24 NOTE — Patient Instructions (Addendum)
It was a pleasure to meet you today [name]   For your abdominal pain- take omeprazole 20 mg daily   We will call you with the results of the test we have performed today   Please schedule an appointment with Dr. Loney Lohathore in 3 months

## 2016-07-24 NOTE — Progress Notes (Signed)
   CC: vaginal discharge and abdominal pain   HPI: [name] is a 65 y.o. with past medical history as outlined below who presents to clinic for acute complaint of vaginal discharge. About 1 week ago she developed vaginal pain, spotting and white malodorous discharge. These symptoms are associated with chills at night. She went through menopause in her 6240s, she has had no other episodes of vaginal bleeding since then. She had unprotected intercourse with a new partner about one month ago. She has a history of chlamydia treated two years ago. She was last screened for HIV 11/2015. Her last pap smear was in 2011, this was negative for malignancy.   She has had abdominal pain which was initially epigastric then radiated to her bilateral sides then radiated to her groin. She has also had bloating and diarrhea without mucous or blood. The abdominal pain is made worse with eating and better with gabapentin. The pain is not worse when she lies down. She denies cough, weight loss, dysuria, hematuria, or flank pain.   Please see problem list for status of the pt's chronic medical problems.  Past Medical History:  Diagnosis Date  . Depression   . ETOH abuse   . Hepatitis C   . Hypertension   . Mental disorder   . Substance abuse     Review of Systems:  Please see each problem below for a pertinent review of systems. ROS  Physical Exam:  Vitals:   07/24/16 0828  BP: 119/75  Pulse: 63  Temp: 97.5 F (36.4 C)  TempSrc: Oral  SpO2: 100%  Weight: 141 lb 11.2 oz (64.3 kg)  Height: 5\' 7"  (1.702 m)   Physical Exam  Constitutional: She appears well-developed and well-nourished. No distress.  Cardiovascular: Normal rate and regular rhythm.   No murmur heard. Pulmonary/Chest: She has no wheezes. She has no rales.  Abdominal: Soft. Bowel sounds are normal. She exhibits no distension and no mass. There is tenderness. There is no guarding.  Epigastric tenderness   Genitourinary: There is no  rash, tenderness or lesion on the right labia. There is no rash, tenderness or lesion on the left labia. Uterus is not tender. Cervix exhibits no motion tenderness. Right adnexum displays no mass and no tenderness. Left adnexum displays no mass and no tenderness. No erythema, tenderness or bleeding in the vagina. No signs of injury around the vagina. No vaginal discharge found.  Lymphadenopathy:       Right: No inguinal adenopathy present.       Left: No inguinal adenopathy present.  Psychiatric: Her mood appears anxious.   Assessment & Plan:   See Encounters Tab for problem based charting.  Patient seen with Dr. Cleda DaubE. Hoffman

## 2016-07-25 DIAGNOSIS — R109 Unspecified abdominal pain: Secondary | ICD-10-CM | POA: Insufficient documentation

## 2016-07-25 DIAGNOSIS — R102 Pelvic and perineal pain: Secondary | ICD-10-CM | POA: Insufficient documentation

## 2016-07-25 LAB — CYTOLOGY - PAP: DIAGNOSIS: NEGATIVE

## 2016-07-25 LAB — CERVICOVAGINAL ANCILLARY ONLY
Chlamydia: NEGATIVE
NEISSERIA GONORRHEA: NEGATIVE
Wet Prep (BD Affirm): POSITIVE — AB

## 2016-07-25 MED ORDER — METRONIDAZOLE 500 MG PO TABS: 500.0000 mg | ORAL_TABLET | Freq: Two times a day (BID) | ORAL | 0 refills | Status: AC

## 2016-07-25 NOTE — Assessment & Plan Note (Signed)
BP today 119/75. Well controlled on current regiment   Refilled lisinopril - HCTZ 10-25 mg

## 2016-07-25 NOTE — Assessment & Plan Note (Addendum)
About 1 week ago she developed vaginal pain, spotting and white malodorous discharge. These symptoms are associated with chills at night. She went through menopause in her 1540s, she has had no other episodes of vaginal bleeding since then. She had unprotected intercourse with a new partner about one month ago. She has a history of chlamydia treated two years ago. She was last screened for HIV 11/2015. Her last pap smear was in 2011, this was negative for malignancy.   Vaginal exam revealed no lesions or discharge. No tenderness on manual exam. Given the history of recent unprotected sex and history of STDs it is possible that she has an STD at this time. Wet prep was positive for gardnerella and trichomonas. Pap smear was negative for malignancy. Gonorrhea and chlamydia swabs were negative.   Ordered Pap smear, gonorrhea and chlamydia swabs, and Wet prep  Prescribed Metronidazole 500 mg BID 7 days   Called to inform patient of the results of the test. Her new partner was from out of town but she is in touch with him and will inform him that he needs to seek medical treatment.

## 2016-07-25 NOTE — Assessment & Plan Note (Signed)
She has had abdominal pain which was initially epigastric then radiated to her bilateral sides then radiated to her groin. She has also had bloating and diarrhea without mucous or blood. The abdominal pain is made worse with eating and better with gabapentin. The pain is not worse when she lies down. She denies cough, weight loss, dysuria, hematuria, or flank pain.   Epigastric tenderness on exam. This presentation may be related to GERD.   Trial of omeprazole 20 mg daily

## 2016-07-26 ENCOUNTER — Telehealth: Payer: Self-pay | Admitting: Internal Medicine

## 2016-07-26 MED FILL — metroNIDAZOLE 500 MG TABS: 500 | 7 days supply | Qty: 14 | Fill #0

## 2016-07-29 NOTE — Progress Notes (Signed)
Internal Medicine Clinic Attending  I saw and evaluated the patient.  I personally confirmed the key portions of the history and exam documented by Dr. Obie DredgeBlum and I reviewed pertinent patient test results.  The assessment, diagnosis, and plan were formulated together and I agree with the documentation in the resident's note. Purnell ShoemakerKaye was present as chaperone for exam.

## 2016-07-29 NOTE — Telephone Encounter (Signed)
This encounter was created in error

## 2016-08-23 ENCOUNTER — Telehealth: Payer: Self-pay | Admitting: Internal Medicine

## 2016-08-23 NOTE — Telephone Encounter (Signed)
APT. REMINDER CALL, PHONE NOT IN SERVICE °

## 2016-08-28 ENCOUNTER — Ambulatory Visit: Payer: Self-pay

## 2016-08-28 ENCOUNTER — Encounter: Payer: Self-pay | Admitting: Internal Medicine

## 2016-09-04 ENCOUNTER — Encounter: Payer: Self-pay | Admitting: Internal Medicine

## 2016-09-27 ENCOUNTER — Telehealth: Payer: Self-pay | Admitting: Internal Medicine

## 2016-09-27 NOTE — Telephone Encounter (Signed)
APT. REMINDER CALL, PHONE NOT IN SERVICE °

## 2016-09-28 ENCOUNTER — Ambulatory Visit: Payer: Self-pay

## 2016-10-05 ENCOUNTER — Encounter: Payer: Self-pay | Admitting: Internal Medicine

## 2016-10-11 ENCOUNTER — Telehealth: Payer: Self-pay | Admitting: Internal Medicine

## 2016-10-11 NOTE — Telephone Encounter (Signed)
First certified letter sent back on September 04, 2016 instructing patient to schedule an appt in 60 days was just returned today unclaimed and unable to forward.  Second letter was just mailed this week to the same address on file.  Forwarding to Dr. Rogelia BogaButcher.

## 2016-11-02 ENCOUNTER — Encounter: Payer: Self-pay | Admitting: Internal Medicine

## 2016-12-07 ENCOUNTER — Observation Stay (HOSPITAL_COMMUNITY): Payer: Medicaid Other

## 2016-12-07 ENCOUNTER — Inpatient Hospital Stay (HOSPITAL_COMMUNITY)
Admission: EM | Admit: 2016-12-07 | Discharge: 2016-12-10 | DRG: 690 | Disposition: A | Discharge status: Home / Self Care | Payer: Medicaid Other | Attending: Internal Medicine | Admitting: Internal Medicine

## 2016-12-07 ENCOUNTER — Encounter (HOSPITAL_COMMUNITY): Payer: Self-pay | Admitting: *Deleted

## 2016-12-07 DIAGNOSIS — F1011 Alcohol abuse, in remission: Secondary | ICD-10-CM | POA: Diagnosis not present

## 2016-12-07 DIAGNOSIS — F329 Major depressive disorder, single episode, unspecified: Secondary | ICD-10-CM | POA: Diagnosis present

## 2016-12-07 DIAGNOSIS — Z880 Allergy status to penicillin: Secondary | ICD-10-CM | POA: Diagnosis not present

## 2016-12-07 DIAGNOSIS — G44229 Chronic tension-type headache, not intractable: Secondary | ICD-10-CM | POA: Diagnosis present

## 2016-12-07 DIAGNOSIS — K219 Gastro-esophageal reflux disease without esophagitis: Secondary | ICD-10-CM | POA: Diagnosis present

## 2016-12-07 DIAGNOSIS — K7460 Unspecified cirrhosis of liver: Secondary | ICD-10-CM | POA: Diagnosis present

## 2016-12-07 DIAGNOSIS — Z87440 Personal history of urinary (tract) infections: Secondary | ICD-10-CM

## 2016-12-07 DIAGNOSIS — F1411 Cocaine abuse, in remission: Secondary | ICD-10-CM

## 2016-12-07 DIAGNOSIS — R740 Nonspecific elevation of levels of transaminase and lactic acid dehydrogenase [LDH]: Secondary | ICD-10-CM | POA: Diagnosis present

## 2016-12-07 DIAGNOSIS — N12 Tubulo-interstitial nephritis, not specified as acute or chronic: Secondary | ICD-10-CM

## 2016-12-07 DIAGNOSIS — F1910 Other psychoactive substance abuse, uncomplicated: Secondary | ICD-10-CM | POA: Diagnosis present

## 2016-12-07 DIAGNOSIS — Z9049 Acquired absence of other specified parts of digestive tract: Secondary | ICD-10-CM

## 2016-12-07 DIAGNOSIS — B182 Chronic viral hepatitis C: Secondary | ICD-10-CM | POA: Diagnosis not present

## 2016-12-07 DIAGNOSIS — Z66 Do not resuscitate: Secondary | ICD-10-CM | POA: Diagnosis present

## 2016-12-07 DIAGNOSIS — R911 Solitary pulmonary nodule: Secondary | ICD-10-CM | POA: Diagnosis present

## 2016-12-07 DIAGNOSIS — G629 Polyneuropathy, unspecified: Secondary | ICD-10-CM | POA: Diagnosis present

## 2016-12-07 DIAGNOSIS — B9620 Unspecified Escherichia coli [E. coli] as the cause of diseases classified elsewhere: Secondary | ICD-10-CM | POA: Diagnosis present

## 2016-12-07 DIAGNOSIS — N179 Acute kidney failure, unspecified: Secondary | ICD-10-CM | POA: Diagnosis present

## 2016-12-07 DIAGNOSIS — Z833 Family history of diabetes mellitus: Secondary | ICD-10-CM

## 2016-12-07 DIAGNOSIS — I10 Essential (primary) hypertension: Secondary | ICD-10-CM | POA: Diagnosis not present

## 2016-12-07 DIAGNOSIS — F17210 Nicotine dependence, cigarettes, uncomplicated: Secondary | ICD-10-CM

## 2016-12-07 DIAGNOSIS — Z809 Family history of malignant neoplasm, unspecified: Secondary | ICD-10-CM | POA: Diagnosis not present

## 2016-12-07 DIAGNOSIS — B1920 Unspecified viral hepatitis C without hepatic coma: Secondary | ICD-10-CM | POA: Diagnosis present

## 2016-12-07 DIAGNOSIS — D696 Thrombocytopenia, unspecified: Secondary | ICD-10-CM | POA: Diagnosis present

## 2016-12-07 DIAGNOSIS — N10 Acute tubulo-interstitial nephritis: Secondary | ICD-10-CM | POA: Diagnosis not present

## 2016-12-07 DIAGNOSIS — Z8249 Family history of ischemic heart disease and other diseases of the circulatory system: Secondary | ICD-10-CM

## 2016-12-07 DIAGNOSIS — Z79899 Other long term (current) drug therapy: Secondary | ICD-10-CM

## 2016-12-07 DIAGNOSIS — R509 Fever, unspecified: Secondary | ICD-10-CM

## 2016-12-07 LAB — CBC WITH DIFFERENTIAL/PLATELET
BASOS PCT: 0 %
Basophils Absolute: 0 10*3/uL (ref 0.0–0.1)
EOS ABS: 0 10*3/uL (ref 0.0–0.7)
Eosinophils Relative: 0 %
HEMATOCRIT: 38.4 % (ref 36.0–46.0)
Hemoglobin: 13.3 g/dL (ref 12.0–15.0)
LYMPHS ABS: 2.2 10*3/uL (ref 0.7–4.0)
Lymphocytes Relative: 13 %
MCH: 32.6 pg (ref 26.0–34.0)
MCHC: 34.6 g/dL (ref 30.0–36.0)
MCV: 94.1 fL (ref 78.0–100.0)
Monocytes Absolute: 1.3 10*3/uL — ABNORMAL HIGH (ref 0.1–1.0)
Monocytes Relative: 8 %
NEUTROS ABS: 12.7 10*3/uL — AB (ref 1.7–7.7)
NEUTROS PCT: 78 %
Platelets: 112 10*3/uL — ABNORMAL LOW (ref 150–400)
RBC: 4.08 MIL/uL (ref 3.87–5.11)
RDW: 15 % (ref 11.5–15.5)
WBC: 16.1 10*3/uL — AB (ref 4.0–10.5)

## 2016-12-07 LAB — URINALYSIS, ROUTINE W REFLEX MICROSCOPIC
BILIRUBIN URINE: NEGATIVE
GLUCOSE, UA: NEGATIVE mg/dL
KETONES UR: NEGATIVE mg/dL
NITRITE: POSITIVE — AB
PROTEIN: 30 mg/dL — AB
Specific Gravity, Urine: 1.015 (ref 1.005–1.030)
pH: 6 (ref 5.0–8.0)

## 2016-12-07 LAB — COMPREHENSIVE METABOLIC PANEL
ALT: 157 U/L — ABNORMAL HIGH (ref 14–54)
ANION GAP: 7 (ref 5–15)
AST: 182 U/L — ABNORMAL HIGH (ref 15–41)
Albumin: 3.2 g/dL — ABNORMAL LOW (ref 3.5–5.0)
Alkaline Phosphatase: 64 U/L (ref 38–126)
BUN: 12 mg/dL (ref 6–20)
CALCIUM: 9.3 mg/dL (ref 8.9–10.3)
CHLORIDE: 106 mmol/L (ref 101–111)
CO2: 23 mmol/L (ref 22–32)
CREATININE: 1.01 mg/dL — AB (ref 0.44–1.00)
GFR, EST NON AFRICAN AMERICAN: 58 mL/min — AB (ref >60)
Glucose, Bld: 103 mg/dL — ABNORMAL HIGH (ref 65–99)
Potassium: 3.8 mmol/L (ref 3.5–5.1)
SODIUM: 136 mmol/L (ref 135–145)
Total Bilirubin: 1 mg/dL (ref 0.3–1.2)
Total Protein: 8.4 g/dL — ABNORMAL HIGH (ref 6.5–8.1)

## 2016-12-07 LAB — RAPID URINE DRUG SCREEN, HOSP PERFORMED
Amphetamines: NOT DETECTED
Barbiturates: NOT DETECTED
Benzodiazepines: NOT DETECTED
Cocaine: NOT DETECTED
Opiates: NOT DETECTED
TETRAHYDROCANNABINOL: NOT DETECTED

## 2016-12-07 LAB — I-STAT CG4 LACTIC ACID, ED
LACTIC ACID, VENOUS: 0.86 mmol/L (ref 0.5–1.9)
Lactic Acid, Venous: 1.94 mmol/L (ref 0.5–1.9)

## 2016-12-07 MED ORDER — HYDROCHLOROTHIAZIDE 12.5 MG PO CAPS
12.5000 mg | ORAL_CAPSULE | Freq: Every day | ORAL | Status: DC
  Administered 2016-12-07 – 2016-12-10 (×4): 12.5 mg via ORAL
  Filled 2016-12-07 (×4): qty 1

## 2016-12-07 MED ORDER — ACETAMINOPHEN 325 MG PO TABS
650.0000 mg | ORAL_TABLET | Freq: Four times a day (QID) | ORAL | Status: DC | PRN
  Administered 2016-12-07 – 2016-12-09 (×3): 650 mg via ORAL
  Filled 2016-12-07 (×4): qty 2

## 2016-12-07 MED ORDER — LISINOPRIL-HYDROCHLOROTHIAZIDE 10-12.5 MG PO TABS: 1.0000 | ORAL_TABLET | Freq: Every day | ORAL | Status: DC

## 2016-12-07 MED ORDER — GABAPENTIN 300 MG PO CAPS
300.0000 mg | ORAL_CAPSULE | Freq: Three times a day (TID) | ORAL | Status: DC
  Administered 2016-12-07 – 2016-12-10 (×9): 300 mg via ORAL
  Filled 2016-12-07 (×9): qty 1

## 2016-12-07 MED ORDER — LISINOPRIL 10 MG PO TABS
10.0000 mg | ORAL_TABLET | Freq: Every day | ORAL | Status: DC
  Administered 2016-12-07 – 2016-12-10 (×4): 10 mg via ORAL
  Filled 2016-12-07 (×4): qty 1

## 2016-12-07 MED ORDER — DEXTROSE 5 % IV SOLN
1.0000 g | Freq: Once | INTRAVENOUS | Status: AC
  Administered 2016-12-07: 1 g via INTRAVENOUS
  Filled 2016-12-07: qty 10

## 2016-12-07 MED ORDER — NICOTINE 7 MG/24HR TD PT24
7.0000 mg | MEDICATED_PATCH | Freq: Every day | TRANSDERMAL | Status: DC
  Administered 2016-12-07 – 2016-12-10 (×4): 7 mg via TRANSDERMAL
  Filled 2016-12-07 (×4): qty 1

## 2016-12-07 MED ORDER — ACETAMINOPHEN 325 MG PO TABS
650.0000 mg | ORAL_TABLET | Freq: Once | ORAL | Status: AC
  Administered 2016-12-07: 650 mg via ORAL
  Filled 2016-12-07: qty 2

## 2016-12-07 MED ORDER — SODIUM CHLORIDE 0.9 % IV BOLUS (SEPSIS)
1000.0000 mL | Freq: Once | INTRAVENOUS | Status: AC
  Administered 2016-12-07: 1000 mL via INTRAVENOUS

## 2016-12-07 MED ORDER — DEXTROSE 5 % IV SOLN
1.0000 g | INTRAVENOUS | Status: DC
  Administered 2016-12-08 – 2016-12-10 (×3): 1 g via INTRAVENOUS
  Filled 2016-12-07 (×3): qty 10

## 2016-12-07 MED ORDER — ACETAMINOPHEN 650 MG RE SUPP: 650.0000 mg | Freq: Four times a day (QID) | RECTAL | Status: DC | PRN

## 2016-12-07 MED ORDER — ENOXAPARIN SODIUM 40 MG/0.4ML ~~LOC~~ SOLN
40.0000 mg | SUBCUTANEOUS | Status: DC
  Administered 2016-12-07 – 2016-12-09 (×3): 40 mg via SUBCUTANEOUS
  Filled 2016-12-07 (×3): qty 0.4

## 2016-12-07 MED ORDER — SODIUM CHLORIDE 0.9 % IV SOLN
INTRAVENOUS | Status: AC
  Administered 2016-12-07: 100 mL/h via INTRAVENOUS
  Administered 2016-12-08: 02:00:00 via INTRAVENOUS

## 2016-12-07 MED ORDER — KETOROLAC TROMETHAMINE 15 MG/ML IJ SOLN
15.0000 mg | Freq: Four times a day (QID) | INTRAMUSCULAR | Status: DC | PRN
  Administered 2016-12-07: 15 mg via INTRAVENOUS
  Filled 2016-12-07: qty 1

## 2016-12-07 NOTE — ED Notes (Signed)
Report given to Ginger RN on the floor. RN stated she is okay with pt coming up after 640pm

## 2016-12-07 NOTE — ED Notes (Signed)
Pt ambulated to restroom to collect urine sample.

## 2016-12-07 NOTE — Progress Notes (Signed)
Attempted report to [facility]. Will call me back.

## 2016-12-07 NOTE — ED Notes (Signed)
Patient transported to X-ray 

## 2016-12-07 NOTE — ED Notes (Signed)
Attempted report x1. 

## 2016-12-07 NOTE — ED Notes (Signed)
Pt reports chills Wednesday night and vomit x 1.  Since then, chills are more controlled and less frequent.  Nausea has passed.

## 2016-12-07 NOTE — H&P (Signed)
Date: 12/07/2016               Patient Name:  Dana Mcintosh MRN: [id number]  DOB: Feb 07, 1951 Age / Sex: 66 y.o., female   PCP: Gentry Fitz         Medical Service: Internal Medicine Teaching Service         Attending Physician: Dr. Tyson Alias, MD    First Contact: Melba Coon, MSIV Pager: [phone]  Second Contact: Dr. Darreld Mclean Pager: [phone]       After Hours (After 5p/  First Contact Pager: [phone]  weekends / holidays): Second Contact Pager: [phone]   Chief Complaint: "kidney infection"  History of Present Illness: [name] is a 66 year old female with PMH of Untreated Chronic Hepatitis C (RNA 12,100,100 on 11/03/2015), HTN, Polysubstance Abuse (hx of alcohol and cocaine), Tobacco use, and solitary right lower lobe pulmonary nodule who presents with back pain. Patient states she had new-onset mid-low back pain beginning two days ago with associated chills, generalized body aches, diffuse headache, nausea, with one episode of unobserved emesis, and blurry vision. She reports urinary frequency, but no burning with urination or hematuria. She feels thirsty. She has had a poor appetite with decreased fluid intake during this time. She denies any subjective fevers, abdominal pain, diarrhea, cough, shortness of breath, chest pain, hematochezia, or rash. She reports a suspected remote history of kidney infection in the past. She was positive for Trichomonas and Gardnerella on wet prep in November 2017 (negative GC/Chlamydia) at which time she was treated with metronidazole.  In the ED, patient was found to be febrile to 101.1 F. BP was 136/93, pulse 91, satting 97% on room air. UA was positive for nitrites, moderate leukocytes, and TNTC WBCs. CBC was notable for a leukocytosis of 16.1 and platelet count of 112 (104 six months ago). CMP revealed a serum creatinine of 1.01 (baseline <1.00), AST/ALT 182/157 (129/104 six months ago). Lactic acid was 0.86 > 1.94. Per ED  provider, patient had bilateral CVA tenderness on exam. She was started on IV Ceftriaxone, given 1 L NS bolus, and Tylenol with improvement in symptoms. IMTS was asked to admit for observation.  Meds:  Current Meds  Medication Sig  . gabapentin (NEURONTIN) 300 MG capsule Take 1 capsule (300 mg total) by mouth 3 (three) times daily. For pain  . lisinopril-hydrochlorothiazide (ZESTORETIC) 10-12.5 MG tablet Take 1 tablet by mouth daily.  Marland Kitchen omeprazole (PRILOSEC) 20 MG capsule Take 1 capsule (20 mg total) by mouth daily.     Allergies: Allergies as of 12/07/2016 - Review Complete 12/07/2016  Allergen Reaction Noted  . Penicillins Hives 10/08/2011   Past Medical History:  Diagnosis Date  . Depression   . ETOH abuse   . Hepatitis C   . Hypertension   . Mental disorder   . Substance abuse     Family History:  Mother: Diabetes, HTN Father: Cancer  Social History:  Current smoker of 7-8 cigarettes since age 13 (used to smoke more). Former alcohol use, reports sobriety for the last 60 days. Former use of cocaine, reports sobriety for last 60 days.  Review of Systems: A complete ROS was negative except as per HPI.   Physical Exam: Blood pressure (!) 165/135, pulse 72, temperature (!) 103 F (39.4 C), temperature source Oral, resp. rate 20, height  (1.702 m), weight 141 lb (64 kg), last menstrual period 07/27/1997, SpO2 100 %. Physical Exam  Constitutional: She is oriented  to person, place, and time. She appears well-developed and well-nourished. No distress.  HENT:  Head: Normocephalic and atraumatic.  Mouth/Throat: Oropharynx is clear and moist. No oropharyngeal exudate.  Eyes: Pupils are equal, round, and reactive to light. No scleral icterus.  Neck: Neck supple. No tracheal deviation present. No thyromegaly present.  Cardiovascular: Normal rate, regular rhythm and intact distal pulses.   No murmur heard. Pulmonary/Chest: Effort normal. No respiratory distress. She has no  wheezes. She has no rales.  Abdominal: Soft. She exhibits no distension. There is no guarding.  Minimal tenderness in epigastric, upper quadrants, and suprapubic area. Quiet bowel sounds.  Musculoskeletal: She exhibits no edema or tenderness.  Lymphadenopathy:    She has no cervical adenopathy.  Neurological: She is alert and oriented to person, place, and time.  Skin: Capillary refill takes less than 2 seconds. She is not diaphoretic.     EKG: Not obtained.  CXR: Not obtained.  Assessment & Plan by Problem: Principal Problem:   Acute pyelonephritis Active Problems:   Chronic hepatitis C (HCC)   Polysubstance abuse   Essential hypertension, benign   Solitary pulmonary nodule  [name] is a 66 year old female with PMH of Untreated Chronic Hepatitis C (RNA 12,100,100 on 11/03/2015), HTN, Polysubstance Abuse (hx of alcohol and cocaine), Tobacco use, and solitary right lower lobe pulmonary nodule who presents with clinical suspicion of acute pyelonephritis.  Acute Pyelonephritis: Patient presenting with fever of 101.13F, bilateral CVA tenderness, UTI, and Leukocytosis of 16. She was started on treatment for clinically suspected pyelonephritis with improvement in her symptoms. Fever and back pain initially subsided with IV Ceftriaxone, fluids, and Tylenol. Patient had recurrent fever spike to 103F and chills. Will continue with IV antibiotics and fluids. Patient had a previous urine culture in 2015 growing E coli with resistance to Ampicillin, otherwise pan-sensitive. - Continue Ceftriaxone 1 gram daily - IVF NS @ 100 mL/hr for 16h - Tylenol and Toradol prn - Obtain Urine Culture - Obtain Blood Cultures (received abx already)  HTN: BPs elevated, unclear adherence to medications. - Resume home Lisinopril-HCTZ 10-12.5 mg daily  Chronic Untreated Hepatitis C:   Last RNA 12,100,100 on 11/03/2015. CT abd/pelvis in August 2015 with imaging findings suggestive of cirrhosis. She was  previously referred to ID, but did not follow up. She has a chronic transaminitis that is slightly elevated from prior in the setting of acute illness. - f/u with ID - f/u with [facility] to establish primary care  DVT/PE ppx: Lovenox Diet: HH Code: DNR    Dispo: Admit patient to Observation with expected length of stay less than 2 midnights.  Signed: Darreld Mclean, MD 12/07/2016, 6:03 PM

## 2016-12-07 NOTE — ED Triage Notes (Signed)
Pt states headache and body aches since Wed.  Fever of 101.1 in triage.  Asks not go give narcotics - recently quit in Feb.

## 2016-12-07 NOTE — ED Notes (Signed)
Pt just finishing lunch tray, approx 80% completed.  States she is beginning to feel better.  Awaiting admission.

## 2016-12-07 NOTE — ED Provider Notes (Signed)
Medical screening examination/treatment/procedure(s) were conducted as a shared visit with non-physician practitioner(s) and myself.  I personally evaluated the patient during the encounter.   EKG Interpretation None        Patient presents 2 days fever, nausea, vomiting, body aches, urinary frequency and suprapubic pressure. History of HCV w/ cirrhosis. Febrile on arrival, hemodynamically stable. No acute distress. Soft abdomen, With bilateral CVA tenderness. Blood work with leukocytosis of 16 as well as an acute kidney injury. Given IV fluids, ceftriaxone, and Tylenol. Does feel improved. Given systemic signs and symptoms of illness, will admit for observation for pyelonephritis. Discussed with internal medicine teaching service.   Lavera Guise, MD 12/07/16 1434

## 2016-12-07 NOTE — H&P (Signed)
Date: 12/07/2016               Patient Name:  Dana Mcintosh MRN: [id number]  DOB: Jul 23, 1951 Age / Sex: 66 y.o., female   PCP: John Giovanni, MD              Medical Service: Internal Medicine Teaching Service              Attending Physician: Dr. Tyson Alias, MD    First Contact: Melba Coon, MS IV Pager: [phone]  Second Contact: Dr. Allena Katz Pager: [phone]            After Hours (After 5p/  First Contact Pager: [phone]  weekends / holidays): Second Contact Pager: [phone]    Chief Complaint: fever  History of Present Illness: Dana Mcintosh is a 66 y.o. Female with PMHx HTN, solitary pulmonary nodule, Hepatitis C, depression, tobacco abuse, alcohol abuse, substance abuse (crack cocaine) who presents to the ED with fever with associated increased urinary frequency. Patient reports she was in her normal state of health up until 2 days ago when she developed back pain and generalized aches. Around the same time, she started to have increased urinary frequency without burning with urination. She reports that she had chills that started yesterday. Reports vomiting (unable to comment whether it was non-bloody, non-bilious) x 1 at onset, no vomiting since then. Denies documented fever at home, abdominal pain, shortness of breath, cough, chest pain, dry mouth. Patient denies any sick contacts. She has not taken any medications for her symptoms. She reports that she does not want any narcotic medications; she reports she quit in 09/2016. She also reports she quit cocaine and alcohol 2 months ago as well. Still 1/2 PPD smoker since she was 66 y.o.  ED Course: Patient was febrile 101.1, with BP max in the ED 171/92, non-tachycardic, non-tachypneic, satting 100% RA. Patient had leukocytosis 16.1, lactate 1.94, Cr 1.01 (baseline 0.7-0.8), transaminitis with AST 182, ALT 157, AP 64, total bili 1.0. UA showed positive nitrite with moderate leukocytes. Patient was started on ceftriaxine 1 g  for empiric coverage, NS 1 L bolus, Tylenol 650 mg. Patient's fever resolved and patient is overall feeling much better with improved generalized body aches and back pain.   Meds: Current Facility-Administered Medications  Medication Dose Route Frequency Provider Last Rate Last Dose  . 0.9 %  sodium chloride infusion   Intravenous Continuous Darreld Mclean, MD 100 mL/hr at 12/07/16 1519 100 mL/hr at 12/07/16 1519  . acetaminophen (TYLENOL) tablet 650 mg  650 mg Oral Q6H PRN Darreld Mclean, MD       Or  . acetaminophen (TYLENOL) suppository 650 mg  650 mg Rectal Q6H PRN Darreld Mclean, MD      . Melene Muller ON 12/08/2016] cefTRIAXone (ROCEPHIN) 1 g in dextrose 5 % 50 mL IVPB  1 g Intravenous Q24H Darreld Mclean, MD      . enoxaparin (LOVENOX) injection 40 mg  40 mg Subcutaneous Q24H Darreld Mclean, MD      . gabapentin (NEURONTIN) capsule 300 mg  300 mg Oral TID Darreld Mclean, MD      . lisinopril-hydrochlorothiazide (PRINZIDE,ZESTORETIC) 10-12.5 MG per tablet 1 tablet  1 tablet Oral Daily Darreld Mclean, MD       Current Outpatient Prescriptions  Medication Sig Dispense Refill  . gabapentin (NEURONTIN) 300 MG capsule Take 1 capsule (300 mg total) by mouth 3 (three) times daily. For pain 90 capsule 0  . lisinopril-hydrochlorothiazide (ZESTORETIC) 10-12.5 MG tablet Take  1 tablet by mouth daily. 30 tablet 0  . omeprazole (PRILOSEC) 20 MG capsule Take 1 capsule (20 mg total) by mouth daily. 30 capsule 0    Allergies: Allergies as of 12/07/2016 - Review Complete 12/07/2016  Allergen Reaction Noted  . Penicillins Hives 10/08/2011   Past Medical History:  Diagnosis Date  . Depression   . ETOH abuse   . Hepatitis C   . Hypertension   . Mental disorder   . Substance abuse    Past Surgical History:  Procedure Laterality Date  . CHOLECYSTECTOMY    . TUBAL LIGATION     Family History  Problem Relation Age of Onset  . Diabetes Mother   . Hypertension Mother   . Cancer Father    Social History    Social History  . Marital status: Divorced    Spouse name: N/A  . Number of children: N/A  . Years of education: N/A   Occupational History  . Not on file.   Social History Main Topics  . Smoking status: Current Every Day Smoker    Packs/day: 0.50    Years: 40.00    Types: Cigarettes  . Smokeless tobacco: Never Used  . Alcohol use No     Comment: quit feb 19th, 2018  . Drug use: Yes    Frequency: 7.0 times per week    Types: Cocaine, Marijuana, "Crack" cocaine     Comment: quit feb 19th, 2018  . Sexual activity: No   Other Topics Concern  . Not on file   Social History Narrative  . No narrative on file    Review of Systems: Pertinent items noted in HPI and remainder of comprehensive ROS otherwise negative.  Physical Exam: Blood pressure (!) 171/92, pulse 66, temperature 99 F (37.2 C), temperature source Oral, resp. rate 14, height  (1.702 m), weight 64 kg (141 lb), last menstrual period 07/27/1997, SpO2 100 %. BP (!) 171/92   Pulse 66   Temp 99 F (37.2 C) (Oral)   Resp 14   Ht  (1.702 m)   Wt 64 kg (141 lb)   LMP 07/27/1997   SpO2 100%   BMI 22.08 kg/m   General: resting in bed comfortably, appropriately conversational,  HEENT: no scleral icterus, extra-ocular muscles intact, MMM, oropharynx without lesions Cardiac: regular rate and rhythm, no rubs, murmurs or gallops Pulm: breathing well, clear to auscultation bilaterally Abd: bowel sounds normal, soft, nondistended, mild TTP to medial abdomen, no organomegaly Ext: warm and well perfused, without pedal edema MSK: no point tenderness over spine, strength 5/5 throughout; no CVA tenderness at time of exam Lymph: no cervical or supraclavicular lymphadenopathy  Skin: no rash, hair, or nail changes     Lab results: Labs in EMR reviewed  Imaging results:  No results found.   Assessment & Plan by Problem: Active Problems:   Pyelonephritis  [name] is a 66 y.o. Female with PMHx HTN,  solitary pulmonary nodule, Hepatitis C, depression, tobacco abuse, alcohol abuse, substance abuse (crack cocaine) who presents to the ED with fever with associated increased urinary frequency.   1) Fever - patient presented with fever with associated increased urinary frequency without dysuria, but in the ED was noted to have CVA tenderness, which was not present on my exam after patient had already received abx and Tylenol. Patient had leukocytosis and UA showed positive nitrite with moderate leukocytes. Patient most likely has pyelonephritis vs. Urinary tract infection. Other sources of infection less likely given  no chest pain, shortness of breath, cough or congestion, remarkable abdominal pain, or open wounds. Will treat as pyelonephritis. - IV ceftriaxone; d/c on PO cefdinir - IVFs (NS 100 ml/hr) - Tylenol - Urine cx  2) Hypertension - history of HTN not compliant on medication. Presents with elevated BP to the 170's systolically which could be due to pain and current infection, but also due to medication non compliance. Will restart BP meds here. - Lisinopril-HCTZ 10-12.5mg  daily  3) Transaminitis - patient presents with elevated liver enzymes with AST 182, ALT 157, AP 64, total bili 1.0 which is actually her baseline over the past couple of years. Patient has a history of Hepatitis C antibody positive dating back to 08/2015, and cirrhosis was noted on abdominal CT in 2015. Since this transaminitis does not appear to be acute and is not elevated above her current baseline, will CTM but no intervention. Patient will need to be seen by Infectious Disease outpatient for management of Hepatitis C.  4) Possible DM - patient presents with increased urinary frequency and mildly elevated BG 102. Patient also has neuropathy for which she takes gabapentin at home. Patient reports she drinks a lot of Pepsi. She also has increased cardiac risk with uncontrolled HTN. Will check for DM. - A1C  DVT/PE ppx:  Lovenox CODE: DNR FEN: Heart healthy  Dispo: observation unit; anticipated d/c date 12/08/2016; will not be able to follow up with [facility] Internal Medicine clinic; dismissed from clinic due to missed appointments   This is a Medical Student Note.  The care of the patient was discussed with Dr. Josem Kaufmann and the assessment and plan was formulated with their assistance.  Please see their note for official documentation of the patient encounter.   Signed: Fredricka Bonine, Medical Student 12/07/2016, 3:30 PM

## 2016-12-07 NOTE — Discharge Summary (Signed)
Name: Dana Mcintosh MRN: [id number] DOB: Sep 06, 1950 66 y.o. PCP:   Date of Admission: 12/07/2016 10:16 AM Date of Discharge: 12/10/2016 Attending Physician: Earl Lagos, MD  Discharge Diagnosis: Principal Problem:   Acute pyelonephritis Active Problems:   Chronic hepatitis C (HCC)   Polysubstance abuse   Essential hypertension, benign   Solitary pulmonary nodule   Discharge Medications: Allergies as of 12/10/2016      Reactions   Penicillins Hives      Medication List    TAKE these medications   cefdinir 300 MG capsule Commonly known as:  OMNICEF Take 1 capsule (300 mg total) by mouth 2 (two) times daily. Start taking on:  12/11/2016   gabapentin 300 MG capsule Commonly known as:  NEURONTIN Take 1 capsule (300 mg total) by mouth 3 (three) times daily. For pain   lisinopril-hydrochlorothiazide 10-12.5 MG tablet Commonly known as:  ZESTORETIC Take 1 tablet by mouth daily.   omeprazole 20 MG capsule Commonly known as:  PRILOSEC Take 1 capsule (20 mg total) by mouth daily.       Disposition and follow-up:   Ms.Lalla R Reinhold was discharged from [facility] in Good condition.  At the hospital follow up visit please address:  1.  Continue cefdinir 300 mg twice a day x 6 days (4/17-4/22/2018); already received IV ceftriaxone 4/13-4/16  2. Continue lisinopril-HCTZ 10-12.5 mg daily for HTN control  3. Patient also needs outpatient follow-up with Infectious Disease for hepatitis management; will defer to PCP  4.  Labs / imaging needed at time of follow-up: N/A  5.  Pending labs/ test needing follow-up: - Follow up 4/13 urine cx sensitivities    Follow-up Appointments: Follow-up Information    [facility] Follow up on 12/14/2016.   Why:  Please follow up with [facility] on Friday, December 14, 2016 at 10:30AM. If you can not make this appointment, then please call the center at [phone] to reschedule your  appointment within 1 week of your discharge from the hospital. Contact information: [address] [place] [address]          Hospital Course by problem list: Principal Problem:   Acute pyelonephritis Active Problems:   Chronic hepatitis C (HCC)   Polysubstance abuse   Essential hypertension, benign   Solitary pulmonary nodule    Hospital Course:  Dana Mcintosh is a 66 y.o. Female with PMHx HTN, solitary RLL pulmonary nodule, Hepatitis C, depression, tobacco abuse, alcohol abuse, substance abuse (crack cocaine) who presents to the ED with fever with associated increased urinary frequency.   ED Course: Patient was febrile 101.1, with BP max in the ED 171/92, non-tachycardic, non-tachypneic, satting 100% RA. Patient had leukocytosis 16.1, lactate 1.94, Cr 1.01 (baseline 0.7-0.8), transaminitis with AST 182, ALT 157, AP 64, total bili 1.0. UA showed positive nitrite with moderate leukocytes. Patient was started on ceftriaxine 1 g for empiric coverage, NS 1 L bolus, Tylenol 650 mg. Patient's fever resolved and patient is overall feeling much better with improved generalized body aches and back pain. Patient was admitted to observation unit on 12/07/2016.  Patient was continued on IV ceftriaxone and maintenance IVFs. Patient overall had improved urinary frequency, generalized body aches, and back pain. Leukocytosis resolved on abx therapy. Patient had 2 urine samples in the ED with the latter sample being after she was given initial dose of ceftriaxone; that culture was negative. During the admission, we were able to have  the original urine cx (collected 12/07/16 @ 10:22 am) analyzed before it was discarded which showed gram negative rods, c/w E Coli. Sensitivities were pending at time of discharge. BPs remained in normal range on antihypertensive therapy. Patient reported she has been sober from cocaine and alcohol for 2 months; no evidence of withdrawal symptoms during  admission. HIV antibody negative. Patient's fever was waxing and waning throughout admission but was ultimately afebrile at discharge. CT chest 4/15 to evaluate RLL pulmonary nodule was stable compared to 2015 CT. Patient was hemodynamically stable at discharge with normal physical exam. Patient was discharged on 12/10/2016.   Discharge Vitals:   BP 123/70 (BP Location: Left Arm)   Pulse (!) 58   Temp 98.5 F (36.9 C) (Oral)   Resp 20   Ht  (1.702 m)   Wt 141 lb (64 kg)   LMP 07/27/1997   SpO2 99%   BMI 22.08 kg/m    Discharge Exam: Blood pressure (!) 171/92, pulse 66, temperature 99 F (37.2 C), temperature source Oral, resp. rate 14, height  (1.702 m), weight 64 kg (141 lb), last menstrual period 07/27/1997, SpO2 100 %. General: NAD, appropriately conversational,  HEENT: no scleral icterus, extra-ocular muscles intact, MMM, oropharynx without lesions Cardiac: regular rate and rhythm, no rubs, murmurs or gallops Pulm: breathing well, clear to auscultation bilaterally Abd: bowel sounds normal, soft, nondistended, no TTP, no organomegaly Ext: warm and well perfused, without pedal edema MSK: no point tenderness over spine, strength 5/5 throughout; no CVA tenderness Lymph: no cervical or supraclavicular lymphadenopathy  Skin: no rash, hair, or nail changes  Pertinent Labs, Studies, and Procedures:  Specimen Description URINE, RANDOM   Special Requests NONE   Culture >=100,000 COLONIES/mL ESCHERICHIA COLI    Report Status PENDING   Resulting Agency [facility]    Specimen Collected: 12/09/16 09:20 Last Resulted: 12/10/16 11:27       CT CHEST WO CONTRAST 12/09/16: IMPRESSION: 1. 7 mm right lower lobe pulmonary nodule, no change compared with 2015. 2. Minimal emphysematous changes in the apices.   Discharge Instructions: Discharge Instructions    Call MD for:  difficulty breathing, headache or visual disturbances    Complete by:  As directed    Call MD for:   persistant dizziness or light-headedness    Complete by:  As directed    Call MD for:  persistant nausea and vomiting    Complete by:  As directed    Call MD for:  severe uncontrolled pain    Complete by:  As directed    Call MD for:  temperature >100.4    Complete by:  As directed    Diet - low sodium heart healthy    Complete by:  As directed    Discharge instructions    Complete by:  As directed    Patient to complete 10 day total antibiotics course with Cefdinir 300 mg twice daily for pyelonephritis to be completed on 12/17/15.  Please consider ID referral for management of Chronic Hepatitis C.   Increase activity slowly    Complete by:  As directed        Ms. Kidney, you were treated for a kidney infection called pyelonephritis. We gave you an IV antibiotics called Ceftriaxone and are switching you to an oral antibiotic called Cefdinir. Please complete this antibiotic as follows.  - Take Cefdinir 300 mg by mouth twice a day starting on 12/11/16. You will have 12 pills to take with the last day being  12/16/16.  A CT scan of your chest showed no change to the 7 mm right lower lung nodule that was seen on a CT scan from 2015.   Pyelonephritis, Adult Pyelonephritis is a kidney infection. The kidneys are organs that help clean your blood by moving waste out of your blood and into your pee (urine). This infection can happen quickly, or it can last for a long time. In most cases, it clears up with treatment and does not cause other problems. Follow these instructions at home: Medicines   Take over-the-counter and prescription medicines only as told by your doctor.  Take your antibiotic medicine as told by your doctor. Do not stop taking the medicine even if you start to feel better. General instructions   Drink enough fluid to keep your pee clear or pale yellow.  Avoid caffeine, tea, and carbonated drinks.  Pee (urinate) often. Avoid holding in pee for long periods of time.  Pee  before and after sex.  After pooping (having a bowel movement), women should wipe from front to back. Use each tissue only once.  Keep all follow-up visits as told by your doctor. This is important. Contact a doctor if:  You do not feel better after 2 days.  Your symptoms get worse.  You have a fever. Get help right away if:  You cannot take your medicine or drink fluids as told.  You have chills and shaking.  You throw up (vomit).  You have very bad pain in your side (flank) or back.  You feel very weak or you pass out (faint). This information is not intended to replace advice given to you by your health care provider. Make sure you discuss any questions you have with your health care provider. Document Released: 09/20/2004 Document Revised: 01/19/2016 Document Reviewed: 12/06/2014 Elsevier Interactive Patient Education  2017 [employer]   Signed: Darreld Mclean, MD 12/10/2016, 2:23 PM

## 2016-12-07 NOTE — ED Provider Notes (Signed)
[facility]-EMERGENCY DEPT Provider Note   CSN: [id number] Arrival date & time: 12/07/16  0946     History   Chief Complaint Chief Complaint  Patient presents with  . Headache  . Generalized Body Aches    HPI Dana Mcintosh is a 66 y.o. female.  Pt w PMHx Hep C, cirrhosis, polysubstance abuse, reports w acute onset HA and generalized body aches since Wednesday. Pt reports HA is constant, aching, diffuse, not better w motrin. Assoc intermittent blurry vision. Reports body aches are generalized, w assoc chills and dec energy. One episode N/V on Wednesday, no hematemesis, no nausea since. Reports inc urinary frequency, suprapubic and b/l lower abdl pain w radiation to flanks. Denies vaginal bleeding/discharge, cough, congestion, sore throat.       Past Medical History:  Diagnosis Date  . Depression   . ETOH abuse   . Hepatitis C   . Hypertension   . Mental disorder   . Substance abuse     Patient Active Problem List   Diagnosis Date Noted  . Acute pyelonephritis 12/07/2016  . Vaginal pain 07/25/2016  . Abdominal pain 07/25/2016  . Tobacco use disorder 11/03/2015  . Muscle strain 11/03/2015  . Plantar fasciitis, bilateral 10/05/2015  . Transaminitis 10/05/2015  . Polysubstance abuse 08/30/2015  . Essential hypertension, benign 08/30/2015  . Solitary pulmonary nodule 08/30/2015  . GERD (gastroesophageal reflux disease) 08/30/2015  . Healthcare maintenance 08/30/2015  . Alcohol dependence (HCC) 06/17/2014  . Severe major depression with psychotic features (HCC) 06/17/2014  . Chronic hepatitis C (HCC) 04/02/2014  . Cirrhosis (HCC) 04/02/2014  . Cocaine abuse 07/30/2012    Class: Acute  . Cannabis abuse 07/30/2012    Class: Acute    Past Surgical History:  Procedure Laterality Date  . CHOLECYSTECTOMY    . TUBAL LIGATION      OB History    No data available       Home Medications    Prior to Admission medications   Medication Sig Start Date End Date Taking?  Authorizing Provider  gabapentin (NEURONTIN) 300 MG capsule Take 1 capsule (300 mg total) by mouth 3 (three) times daily. For pain 05/26/16  Yes Gerhard Munch, MD  lisinopril-hydrochlorothiazide (ZESTORETIC) 10-12.5 MG tablet Take 1 tablet by mouth daily. 07/24/16 07/24/17 Yes Eulah Pont, MD  omeprazole (PRILOSEC) 20 MG capsule Take 1 capsule (20 mg total) by mouth daily. 07/24/16 07/24/17 Yes Eulah Pont, MD    Family History Family History  Problem Relation Age of Onset  . Diabetes Mother   . Hypertension Mother   . Cancer Father     Social History Social History  Substance Use Topics  . Smoking status: Current Every Day Smoker    Packs/day: 0.50    Years: 40.00    Types: Cigarettes  . Smokeless tobacco: Never Used  . Alcohol use No     Comment: quit feb 19th, 2018     Allergies   Penicillins   Review of Systems Review of Systems  Constitutional: Positive for activity change, chills, fatigue and fever.  HENT: Positive for rhinorrhea. Negative for congestion, ear pain and sore throat.   Eyes: Negative for photophobia.  Respiratory: Negative for cough and shortness of breath.   Cardiovascular: Negative for chest pain.  Gastrointestinal: Positive for abdominal pain (suprapubic, b/l lower abd), diarrhea, nausea (1 episode) and vomiting (1 episode). Negative for blood in stool.  Genitourinary: Positive for flank pain and frequency. Negative for difficulty urinating, dysuria, hematuria, vaginal bleeding and vaginal  discharge.  Musculoskeletal: Positive for myalgias (generalized).  Neurological: Positive for headaches (dull/achy). Negative for weakness.     Physical Exam Updated Vital Signs BP (!) 165/135 (BP Location: Right Arm)   Pulse 72   Temp (!) 101.9 F (38.8 C) (Oral)   Resp 20   Ht  (1.702 m)   Wt 64 kg   LMP 07/27/1997   SpO2 100%   BMI 22.08 kg/m   Physical Exam  Constitutional: She appears well-developed and well-nourished. No distress.  HENT:    Head: Normocephalic and atraumatic.  Mouth/Throat: No oropharyngeal exudate.  Eyes: Conjunctivae and EOM are normal. Pupils are equal, round, and reactive to light.  Neck: Normal range of motion. Neck supple.  Cardiovascular: Normal rate, regular rhythm, normal heart sounds and intact distal pulses.  Exam reveals no friction rub.   No murmur heard. Pulmonary/Chest: Effort normal and breath sounds normal. No respiratory distress. She has no wheezes. She has no rales.  Abdominal: Soft. Bowel sounds are normal. She exhibits no distension. There is tenderness (suprapubic, b/l lower abd). There is no rebound and no guarding.  b/l CVA tenderness to percussion  Musculoskeletal: Normal range of motion.  Lymphadenopathy:    She has no cervical adenopathy.  Neurological: She is alert. No cranial nerve deficit or sensory deficit. Coordination normal.  Steady gait  Psychiatric: She has a normal mood and affect. Her behavior is normal.  Nursing note and vitals reviewed.    ED Treatments / Results  Labs (all labs ordered are listed, but only abnormal results are displayed) Labs Reviewed  COMPREHENSIVE METABOLIC PANEL - Abnormal; Notable for the following:       Result Value   Glucose, Bld 103 (*)    Creatinine, Ser 1.01 (*)    Total Protein 8.4 (*)    Albumin 3.2 (*)    AST 182 (*)    ALT 157 (*)    GFR calc non Af Amer 58 (*)    All other components within normal limits  CBC WITH DIFFERENTIAL/PLATELET - Abnormal; Notable for the following:    WBC 16.1 (*)    Platelets 112 (*)    Neutro Abs 12.7 (*)    Monocytes Absolute 1.3 (*)    All other components within normal limits  URINALYSIS, ROUTINE W REFLEX MICROSCOPIC - Abnormal; Notable for the following:    Color, Urine AMBER (*)    APPearance HAZY (*)    Hgb urine dipstick SMALL (*)    Protein, ur 30 (*)    Nitrite POSITIVE (*)    Leukocytes, UA MODERATE (*)    Bacteria, UA RARE (*)    Squamous Epithelial / LPF 0-5 (*)    All other  components within normal limits  I-STAT CG4 LACTIC ACID, ED - Abnormal; Notable for the following:    Lactic Acid, Venous 1.94 (*)    All other components within normal limits  URINE CULTURE  HEMOGLOBIN A1C  I-STAT CG4 LACTIC ACID, ED    EKG  EKG Interpretation None       Radiology No results found.  Procedures Procedures (including critical care time)  Medications Ordered in ED Medications  enoxaparin (LOVENOX) injection 40 mg (not administered)  0.9 %  sodium chloride infusion (100 mL/hr Intravenous New Bag/Given 12/07/16 1519)  acetaminophen (TYLENOL) tablet 650 mg (not administered)    Or  acetaminophen (TYLENOL) suppository 650 mg (not administered)  cefTRIAXone (ROCEPHIN) 1 g in dextrose 5 % 50 mL IVPB (not administered)  lisinopril-hydrochlorothiazide (  PRINZIDE,ZESTORETIC) 10-12.5 MG per tablet 1 tablet (not administered)  gabapentin (NEURONTIN) capsule 300 mg (not administered)  acetaminophen (TYLENOL) tablet 650 mg (650 mg Oral Given 12/07/16 1038)  sodium chloride 0.9 % bolus 1,000 mL (0 mLs Intravenous Stopped 12/07/16 1402)  cefTRIAXone (ROCEPHIN) 1 g in dextrose 5 % 50 mL IVPB (0 g Intravenous Stopped 12/07/16 1402)     Initial Impression / Assessment and Plan / ED Course  I have reviewed the triage vital signs and the nursing notes.  Pertinent labs & imaging results that were available during my care of the patient were reviewed by me and considered in my medical decision making (see chart for details).     Pt w pyelonephritis. Pt febrile, systemic sx of fever, chills, myalgias, n/v. Pt w suprapubic tenderness and flank pain. CBC w leukocytosis, CMP w AKI, U/A abnl. Pt given 1g IV Rocephin in ED and IVF. Internal Medicine accepting admission for observation.   The patient appears reasonably stabilized for admission considering the current resources, flow, and capabilities available in the ED at this time, and I doubt any other [facility] requiring further screening  and/or treatment in the ED prior to admission.  Patient discussed with and seen by Dr. Verdie Mosher.   Final Clinical Impressions(s) / ED Diagnoses   Final diagnoses:  Pyelonephritis    New Prescriptions New Prescriptions   No medications on file     [place] N Russo, PA-C 12/07/16 1616    Lavera Guise, MD 12/07/16 2023

## 2016-12-07 NOTE — Progress Notes (Signed)
LAVANDA NEVELS [id number] Admission Data: 12/07/2016 7:20 PM Attending Provider: Tyson Alias, MD  WUJ:[id number] Loney Loh, MD Consults/ Treatment Team:   JOLYNDA TOWNLEY is a 66 y.o. female patient admitted from ED awake, alert  & orientated  X 3,  DNR, VSS - Blood pressure 133/83, pulse 81, temperature (!) 102.3 F (39.1 C), temperature source Oral, resp. rate (!) 24, height  (1.702 m), weight 64 kg (141 lb), last menstrual period 07/27/1997, SpO2 99 %.,no c/o shortness of breath, no c/o chest pain, no distress noted.    IV site WDL:  forearm right, condition patent and no redness with a transparent dsg that's clean dry and intact.  Allergies:   Allergies  Allergen Reactions  . Penicillins Hives     Past Medical History:  Diagnosis Date  . Depression   . ETOH abuse   . Hepatitis C   . Hypertension   . Mental disorder   . Substance abuse     History:  obtained from the patient. Tobacco/alcohol: In recovery from alcohol and drug abuse, requesting no narcotics per Ladona Ridgel, RN in ED.   Pt orientation to unit, room and routine. Information packet given to patient/family and safety video watched.  Admission INP armband ID verified with patient/family, and in place. SR up x 2, fall risk assessment complete with Patient and family verbalizing understanding of risks associated with falls. Pt verbalizes an understanding of how to use the call bell and to call for help before getting out of bed.  Skin, clean-dry- intact without evidence of bruising, or skin tears.   No evidence of skin break down noted on exam. no rashes, no ecchymoses, no petechiae    Will cont to monitor and assist as needed.  Kazia Grisanti Consuella Lose, RN 12/07/2016 7:20 PM

## 2016-12-08 DIAGNOSIS — Z9049 Acquired absence of other specified parts of digestive tract: Secondary | ICD-10-CM | POA: Diagnosis not present

## 2016-12-08 DIAGNOSIS — G629 Polyneuropathy, unspecified: Secondary | ICD-10-CM | POA: Diagnosis present

## 2016-12-08 DIAGNOSIS — N10 Acute tubulo-interstitial nephritis: Secondary | ICD-10-CM | POA: Diagnosis present

## 2016-12-08 DIAGNOSIS — B1920 Unspecified viral hepatitis C without hepatic coma: Secondary | ICD-10-CM | POA: Diagnosis present

## 2016-12-08 DIAGNOSIS — R911 Solitary pulmonary nodule: Secondary | ICD-10-CM | POA: Diagnosis present

## 2016-12-08 DIAGNOSIS — F1411 Cocaine abuse, in remission: Secondary | ICD-10-CM | POA: Diagnosis not present

## 2016-12-08 DIAGNOSIS — K7460 Unspecified cirrhosis of liver: Secondary | ICD-10-CM | POA: Diagnosis present

## 2016-12-08 DIAGNOSIS — F329 Major depressive disorder, single episode, unspecified: Secondary | ICD-10-CM | POA: Diagnosis present

## 2016-12-08 DIAGNOSIS — Z833 Family history of diabetes mellitus: Secondary | ICD-10-CM | POA: Diagnosis not present

## 2016-12-08 DIAGNOSIS — F17210 Nicotine dependence, cigarettes, uncomplicated: Secondary | ICD-10-CM | POA: Diagnosis present

## 2016-12-08 DIAGNOSIS — D696 Thrombocytopenia, unspecified: Secondary | ICD-10-CM | POA: Diagnosis present

## 2016-12-08 DIAGNOSIS — F1011 Alcohol abuse, in remission: Secondary | ICD-10-CM | POA: Diagnosis not present

## 2016-12-08 DIAGNOSIS — N12 Tubulo-interstitial nephritis, not specified as acute or chronic: Secondary | ICD-10-CM | POA: Diagnosis not present

## 2016-12-08 DIAGNOSIS — B9620 Unspecified Escherichia coli [E. coli] as the cause of diseases classified elsewhere: Secondary | ICD-10-CM | POA: Diagnosis present

## 2016-12-08 DIAGNOSIS — Z79899 Other long term (current) drug therapy: Secondary | ICD-10-CM | POA: Diagnosis not present

## 2016-12-08 DIAGNOSIS — R740 Nonspecific elevation of levels of transaminase and lactic acid dehydrogenase [LDH]: Secondary | ICD-10-CM | POA: Diagnosis present

## 2016-12-08 DIAGNOSIS — Z66 Do not resuscitate: Secondary | ICD-10-CM | POA: Diagnosis present

## 2016-12-08 DIAGNOSIS — Z880 Allergy status to penicillin: Secondary | ICD-10-CM | POA: Diagnosis not present

## 2016-12-08 DIAGNOSIS — B182 Chronic viral hepatitis C: Secondary | ICD-10-CM | POA: Diagnosis present

## 2016-12-08 DIAGNOSIS — G44229 Chronic tension-type headache, not intractable: Secondary | ICD-10-CM | POA: Diagnosis present

## 2016-12-08 DIAGNOSIS — K219 Gastro-esophageal reflux disease without esophagitis: Secondary | ICD-10-CM | POA: Diagnosis present

## 2016-12-08 DIAGNOSIS — Z87440 Personal history of urinary (tract) infections: Secondary | ICD-10-CM | POA: Diagnosis not present

## 2016-12-08 DIAGNOSIS — Z8249 Family history of ischemic heart disease and other diseases of the circulatory system: Secondary | ICD-10-CM | POA: Diagnosis not present

## 2016-12-08 DIAGNOSIS — I10 Essential (primary) hypertension: Secondary | ICD-10-CM | POA: Diagnosis present

## 2016-12-08 DIAGNOSIS — N179 Acute kidney failure, unspecified: Secondary | ICD-10-CM | POA: Diagnosis present

## 2016-12-08 LAB — BASIC METABOLIC PANEL
Anion gap: 4 — ABNORMAL LOW (ref 5–15)
BUN: 13 mg/dL (ref 6–20)
CO2: 26 mmol/L (ref 22–32)
Calcium: 8.5 mg/dL — ABNORMAL LOW (ref 8.9–10.3)
Chloride: 109 mmol/L (ref 101–111)
Creatinine, Ser: 0.98 mg/dL (ref 0.44–1.00)
GFR calc non Af Amer: >60 mL/min (ref >60)
GLUCOSE: 104 mg/dL — AB (ref 65–99)
POTASSIUM: 3.7 mmol/L (ref 3.5–5.1)
Sodium: 139 mmol/L (ref 135–145)

## 2016-12-08 LAB — CBC
HEMATOCRIT: 32.9 % — AB (ref 36.0–46.0)
HEMOGLOBIN: 11.3 g/dL — AB (ref 12.0–15.0)
MCH: 32.4 pg (ref 26.0–34.0)
MCHC: 34.3 g/dL (ref 30.0–36.0)
MCV: 94.3 fL (ref 78.0–100.0)
Platelets: 86 10*3/uL — ABNORMAL LOW (ref 150–400)
RBC: 3.49 MIL/uL — AB (ref 3.87–5.11)
RDW: 15.5 % (ref 11.5–15.5)
WBC: 7.7 10*3/uL (ref 4.0–10.5)

## 2016-12-08 LAB — URINE CULTURE: Culture: NO GROWTH

## 2016-12-08 LAB — HEMOGLOBIN A1C
Hgb A1c MFr Bld: 4.9 % (ref 4.8–5.6)
Mean Plasma Glucose: 94 mg/dL

## 2016-12-08 MED ORDER — RAMELTEON 8 MG PO TABS
8.0000 mg | ORAL_TABLET | Freq: Every day | ORAL | Status: DC
  Administered 2016-12-08 – 2016-12-09 (×2): 8 mg via ORAL
  Filled 2016-12-08 (×2): qty 1

## 2016-12-08 NOTE — Progress Notes (Signed)
   Subjective:  Patient is starting to feel better this morning. She has had some suprapubic and flank pain overnight that is starting to find relief. She is eating well. She denies any dysuria. She has been afebrile overnight.  Objective:  Vital signs in last 24 hours: Vitals:   12/07/16 1830 12/07/16 1857 12/07/16 2116 12/08/16 0526  BP: (!) 142/90 133/83 106/68 (!) 149/71  Pulse:  81 67 (!) 51  Resp:  (!) Temp:  (!) 102.3 F (39.1 C) 98.6 F (37 C) 98 F (36.7 C)  TempSrc:  Oral    SpO2:  99% 100% 100%  Weight:      Height:       General: resting in bed, no acute distress Cardiac: RRR, no rubs, murmurs or gallops Pulm: clear to auscultation bilaterally, moving normal volumes of air Abd: soft, nondistended, BS present, mild left CVA tenderness Ext: warm, no pedal edema Neuro: alert and oriented X3   Assessment/Plan:  Principal Problem:   Acute pyelonephritis Active Problems:   Chronic hepatitis C (HCC)   Polysubstance abuse   Essential hypertension, benign   Solitary pulmonary nodule  Dana Mcintosh is a 66 year old female with PMH of Untreated Chronic Hepatitis C (RNA 12,100,100 on 11/03/2015), HTN, Polysubstance Abuse (hx of alcohol and cocaine), Tobacco use, and solitary right lower lobe pulmonary nodule who presents with clinical suspicion of acute pyelonephritis.  Acute Pyelonephritis: Symptoms improving. Leukocytosis has resolved. She is afebrile overnight, however did have a fever up to 103F last night. Will continue with current plan of care and follow up urine culture for appropriate antibiotic regimen. - Continue Ceftriaxone 1 gram daily - Transition to oral abx on discharge to complete 10 day course - Tylenol and Toradol prn - f/u Urine Culture  HTN: Blood pressure mildly elevated this morning. - Continue home Lisinopril-HCTZ 10-12.5 mg daily  Hx Polysubstance Abuse: Patient reports 60 days of sobriety. UDS was negative on admission. Do not  suspect she is at risk for withdrawal.  Dispo: Anticipated discharge in approximately 1 day(s).   Darreld Mclean, MD 12/08/2016, 10:00 AM

## 2016-12-08 NOTE — Progress Notes (Signed)
Patient complained of chills and headache, feels warm to touch. Checked temp 99.5. Gave tylenol at patient request.

## 2016-12-09 ENCOUNTER — Inpatient Hospital Stay (HOSPITAL_COMMUNITY): Payer: Medicaid Other

## 2016-12-09 DIAGNOSIS — R911 Solitary pulmonary nodule: Secondary | ICD-10-CM

## 2016-12-09 DIAGNOSIS — D696 Thrombocytopenia, unspecified: Secondary | ICD-10-CM

## 2016-12-09 LAB — CBC
HCT: 33.3 % — ABNORMAL LOW (ref 36.0–46.0)
Hemoglobin: 11.5 g/dL — ABNORMAL LOW (ref 12.0–15.0)
MCH: 31.8 pg (ref 26.0–34.0)
MCHC: 34.5 g/dL (ref 30.0–36.0)
MCV: 92 fL (ref 78.0–100.0)
PLATELETS: 106 10*3/uL — AB (ref 150–400)
RBC: 3.62 MIL/uL — ABNORMAL LOW (ref 3.87–5.11)
RDW: 14.9 % (ref 11.5–15.5)
WBC: 6.6 10*3/uL (ref 4.0–10.5)

## 2016-12-09 LAB — BASIC METABOLIC PANEL
Anion gap: 8 (ref 5–15)
BUN: 9 mg/dL (ref 6–20)
CALCIUM: 8.9 mg/dL (ref 8.9–10.3)
CO2: 23 mmol/L (ref 22–32)
CREATININE: 0.83 mg/dL (ref 0.44–1.00)
Chloride: 107 mmol/L (ref 101–111)
GFR calc Af Amer: >60 mL/min (ref >60)
GFR calc non Af Amer: >60 mL/min (ref >60)
Glucose, Bld: 95 mg/dL (ref 65–99)
Potassium: 3.5 mmol/L (ref 3.5–5.1)
Sodium: 138 mmol/L (ref 135–145)

## 2016-12-09 LAB — HIV ANTIBODY (ROUTINE TESTING W REFLEX): HIV Screen 4th Generation wRfx: NONREACTIVE

## 2016-12-09 LAB — INFLUENZA PANEL BY PCR (TYPE A & B)
INFLBPCR: NEGATIVE
Influenza A By PCR: NEGATIVE

## 2016-12-09 MED ORDER — POLYETHYLENE GLYCOL 3350 17 G PO PACK
17.0000 g | PACK | Freq: Every day | ORAL | Status: DC | PRN
  Administered 2016-12-09: 17 g via ORAL
  Filled 2016-12-09: qty 1

## 2016-12-09 NOTE — Progress Notes (Signed)
Subjective:  Patient is having continued fevers with a temperature of 100.78F this morning which improved with tylenol. She denies any current fevers, chills, diaphoresis, abdominal pain, or any dysuria. She is eating well. She denies any joint pain but does have neuropathic pain which she believes began after she sustained fractures to her right fifth metatarsal and medial malleolus in 2015. She takes Gabapentin for her neuropathy with relief. She denies any cough, hemoptysis, shortness of breath, or back pain. She reports chronic tension headaches which occur across the front of her head with a throbbing sensation. She says she felt this type of headache this morning which resolved with tylenol. She has not had any change in the quality or location of her headache. She denies any change in vision. She reports poor dentition but denies any tooth pain. She has not noticed any swelling, lumps, or bumps. She thinks she might have been bitten by an insect at her left leg, but has not noticed any rash. She has not noticed any open sores or wounds anywhere. She says she is about 60 days sober from alcohol and cocaine use. She denies any history of injection drug use.   Objective:  Vital signs in last 24 hours: Vitals:   12/08/16 1420 12/08/16 1725 12/08/16 2249 12/09/16 0615  BP: 135/73  131/77 139/82  Pulse: 61  61 72  Resp: Temp: 98.3 F (36.8 C) 99.5 F (37.5 C) 99.1 F (37.3 C) (!) 100.9 F (38.3 C)  TempSrc:  Oral    SpO2: 99%  99% 97%  Weight:      Height:       General: resting in bed, no acute distress HEENT: no scleral icterus, poor dentition, multiple missing teeth, freely mobile round non tender ~0.5 cm nodule felt in right anterior cervical region Cardiac: RRR, no rubs, murmurs or gallops Pulm: clear to auscultation bilaterally, moving normal volumes of air Abd: soft, nondistended, BS present, no CVA tenderness Ext: warm, no pedal edema, no splinter hemorrhages Neuro:  alert and oriented X3   Assessment/Plan:  Principal Problem:   Acute pyelonephritis Active Problems:   Chronic hepatitis C (HCC)   Polysubstance abuse   Essential hypertension, benign   Solitary pulmonary nodule  Ms. Tameah Mihalko is a 66 year old female with PMH of Untreated Chronic Hepatitis C (RNA 12,100,100 on 11/03/2015), HTN, Polysubstance Abuse (hx of alcohol and cocaine), Tobacco use, and solitary right lower lobe pulmonary nodule who presents with clinical suspicion of acute pyelonephritis.  Fevers/ Suspected Acute Pyelonephritis: Patient continues to have fevers with a temp of 100.9 F this morning. Unfortunately the urine culture collected on admission was obtained after she already received antibiotics and has resulted as no growth. If truly negative, her fevers may be due to another cause.  She does not have any obvious other sources of infection on physical exam. Her HIV is non reactive. CXR was without acute cardiopulmonary process on admission. Blood cultures have been NGTD. Given her continued fevers, we will keep patient for continued observation and evaluation of other causes of her fevers. Repeat CBC this morning is without leukocytosis. - Continue Ceftriaxone 1 gram daily (Day #3 of antibiotics) - Added Urine Culture on initial Urine specimen collected on 4/13 at 10 am  Pulmonary Nodule: Solitary right lower lobe pulmonary nodule 7 mm in diameter seen on CT abd/pelvis in August 2015. This has not been followed up on with imaging. Will obtain CT Chest this admission for  follow up assessment. She is a smoker with ~ 23 pack year history. - CT Chest  Chronic Untreated Hepatitis C: Last RNA 12,100,100 on 11/03/2015. CT abd/pelvis in August 2015 with imaging findings suggestive of cirrhosis. She was previously referred to ID, but did not follow up. She has a chronic transaminitis that is slightly elevated from prior in the setting of acute illness. - f/u with ID - f/u with [facility] to establish primary care  Thrombocytopenia: Platelets 106,000 this morning compared to 104,000 six months ago. Suspect this is related to her underlying hepatic dysfunction. She is not having any obvious bleeding. - Continue to monitor  HTN:  - Continue home Lisinopril-HCTZ 10-12.5 mg daily  Hx Polysubstance Abuse: Patient reports 60 days of sobriety. UDS was negative on admission. Do not suspect she is at risk for withdrawal.  Dispo: Anticipated discharge in 1-2 days.   Darreld Mclean, MD 12/09/2016, 8:08 AM

## 2016-12-09 NOTE — Progress Notes (Signed)
Subjective: [name]. Patient is doing overall well. Denies back pain, abdominal pain, or SOB. Reports improved urinary frequency. Patient did spike a fever 100.9 this morning. Patient denies any generalized aches, denies new wounds or rashes, denies ear pain or changes in hearing. Reports headache this AM that resolved with Tylenol. Denies vision changes. Denies tooth pain.   Objective: Vital signs in last 24 hours: Vitals:   12/08/16 1420 12/08/16 1725 12/08/16 2249 12/09/16 0615  BP: 135/73  131/77 139/82  Pulse: 61  61 72  Resp: Temp: 98.3 F (36.8 C) 99.5 F (37.5 C) 99.1 F (37.3 C) (!) 100.9 F (38.3 C)  TempSrc:  Oral    SpO2: 99%  99% 97%  Weight:      Height:       Weight change:   Intake/Output Summary (Last 24 hours) at 12/09/16 0715 Last data filed at 12/09/16 0501  Gross per 24 hour  Intake              290 ml  Output                0 ml  Net              290 ml   General: resting in bed, no acute distress Cardiac: RRR, no rubs, murmurs or gallops Pulm: clear to auscultation bilaterally w/o wheezing, rales, or rhonchi Abd: soft, nondistended, BS present, mild left CVA tenderness Ext: warm, no pedal edema Neuro: alert and oriented X3  Lab Results: Reviewed in EMR Micro Results: Recent Results (from the past 240 hour(s))  Culture, blood (Routine X 2) w Reflex to ID Panel     Status: None (Preliminary result)   Collection Time: 12/07/16  5:58 PM  Result Value Ref Range Status   Specimen Description BLOOD RIGHT FOREARM  Final   Special Requests   Final    BOTTLES DRAWN AEROBIC AND ANAEROBIC Blood Culture adequate volume   Culture NO GROWTH < 24 HOURS  Final   Report Status PENDING  Incomplete  Culture, blood (Routine X 2) w Reflex to ID Panel     Status: None (Preliminary result)   Collection Time: 12/07/16  6:04 PM  Result Value Ref Range Status   Specimen Description BLOOD LEFT FOREARM  Final   Special Requests   Final    BOTTLES DRAWN  AEROBIC AND ANAEROBIC Blood Culture adequate volume   Culture NO GROWTH < 24 HOURS  Final   Report Status PENDING  Incomplete  Culture, Urine     Status: None   Collection Time: 12/07/16  6:37 PM  Result Value Ref Range Status   Specimen Description URINE, CLEAN CATCH  Final   Special Requests NONE  Final   Culture NO GROWTH  Final   Report Status 12/08/2016 FINAL  Final   Studies/Results: Dg Chest 2 View  Result Date: 12/07/2016 CLINICAL DATA:  Fever EXAM: CHEST  2 VIEW COMPARISON:  05/26/2016 chest radiograph. FINDINGS: Stable cardiomediastinal silhouette with normal heart size and aortic atherosclerosis. No pneumothorax. No pleural effusion. Lungs appear clear, with no acute consolidative airspace disease and no pulmonary edema. Stable healed deformity in the lateral lower left rib. IMPRESSION: No active cardiopulmonary disease. Aortic atherosclerosis. Electronically Signed   By: Delbert Phenix M.D.   On: 12/07/2016 18:23   Medications: I have reviewed the patient's current medications. Scheduled Meds: . cefTRIAXone (ROCEPHIN)  IV  1 g Intravenous Q24H  . enoxaparin (LOVENOX) injection  40  mg Subcutaneous Q24H  . gabapentin  300 mg Oral TID  . hydrochlorothiazide  12.5 mg Oral Daily  . lisinopril  10 mg Oral Daily  . nicotine  7 mg Transdermal Daily  . ramelteon  8 mg Oral QHS   Continuous Infusions: PRN Meds:.acetaminophen **OR** acetaminophen, ketorolac Assessment/Plan: Principal Problem:   Acute pyelonephritis Active Problems:   Chronic hepatitis C (HCC)   Polysubstance abuse   Essential hypertension, benign   Solitary pulmonary nodule  Dana Mcintosh is a 66 y.o. Female with PMHx HTN, solitary RLL pulmonary nodule, Hepatitis C, depression, tobacco abuse, alcohol abuse, substance abuse (crack cocaine) who presented 12/07/2016 with fever with associated increased urinary frequency.   Acute Pyelonephritis: Symptoms improving. Leukocytosis has resolved. However, she is febrile  this morning 100.9 so will continue to watch for another day. Urine cx NGTD s/p abx in ED. Original urine still on file so will try to cx this. Will continue with current abx regimen and CTM. - IV Ceftriaxone daily; d/c on PO cefdinir - Tylenol and Toradol prn - CBC this AM for trend WBC - F/u urine cx  HTN: BP in normal range for past 24 hours. Will continue current antihypertensive regimen. - Continue home Lisinopril-HCTZ 10-12.5 mg daily  Hx Polysubstance Abuse: Patient reports 60 days of sobriety. UDS was negative on admission. Do not suspect she is at risk for withdrawal. Denies any history of IV drug use. HIV Ab negative.  Transaminitis - patient presented with elevated liver enzymes with AST 182, ALT 157, AP 64, total bili 1.0 which is actually her baseline over the past couple of years. Patient has a history of Hepatitis C antibody positive dating back to 08/2015, and cirrhosis was noted on abdominal CT in 2015. Since this transaminitis does not appear to be acute and is not elevated above her current baseline, will CTM but no intervention. Patient will need to be seen by Infectious Disease outpatient for management of Hepatitis C.   DVT/PE ppx: Lovenox CODE: DNR FEN: Heart healthy  Dispo: Anticipated discharge in approximately 1 day(s).   This is a Psychologist, occupational Note.  The care of the patient was discussed with Dr. Oswaldo Done and the assessment and plan formulated with their assistance.  Please see their attached note for official documentation of the daily encounter.   LOS: 1 day   Fredricka Bonine, Medical Student 12/09/2016, 7:15 AM

## 2016-12-10 DIAGNOSIS — B9620 Unspecified Escherichia coli [E. coli] as the cause of diseases classified elsewhere: Secondary | ICD-10-CM

## 2016-12-10 MED ORDER — SENNOSIDES-DOCUSATE SODIUM 8.6-50 MG PO TABS
1.0000 | ORAL_TABLET | Freq: Every day | ORAL | Status: DC
  Administered 2016-12-10: 1 via ORAL
  Filled 2016-12-10: qty 1

## 2016-12-10 MED ORDER — CEFDINIR 300 MG PO CAPS: 300.0000 mg | ORAL_CAPSULE | Freq: Two times a day (BID) | ORAL | 0 refills | Status: DC

## 2016-12-10 NOTE — Discharge Instructions (Signed)
Dana Mcintosh, you were treated for a kidney infection called pyelonephritis. We gave you an IV antibiotics called Ceftriaxone and are switching you to an oral antibiotic called Cefdinir. Please complete this antibiotic as follows.  - Take Cefdinir 300 mg by mouth twice a day starting on 12/11/16. You will have 12 pills to take with the last day being 12/16/16.  A CT scan of your chest showed no change to the 7 mm right lower lung nodule that was seen on a CT scan from 2015.   Pyelonephritis, Adult Pyelonephritis is a kidney infection. The kidneys are organs that help clean your blood by moving waste out of your blood and into your pee (urine). This infection can happen quickly, or it can last for a long time. In most cases, it clears up with treatment and does not cause other problems. Follow these instructions at home: Medicines   Take over-the-counter and prescription medicines only as told by your doctor.  Take your antibiotic medicine as told by your doctor. Do not stop taking the medicine even if you start to feel better. General instructions   Drink enough fluid to keep your pee clear or pale yellow.  Avoid caffeine, tea, and carbonated drinks.  Pee (urinate) often. Avoid holding in pee for long periods of time.  Pee before and after sex.  After pooping (having a bowel movement), women should wipe from front to back. Use each tissue only once.  Keep all follow-up visits as told by your doctor. This is important. Contact a doctor if:  You do not feel better after 2 days.  Your symptoms get worse.  You have a fever. Get help right away if:  You cannot take your medicine or drink fluids as told.  You have chills and shaking.  You throw up (vomit).  You have very bad pain in your side (flank) or back.  You feel very weak or you pass out (faint). This information is not intended to replace advice given to you by your health care provider. Make sure you discuss any questions  you have with your health care provider. Document Released: 09/20/2004 Document Revised: 01/19/2016 Document Reviewed: 12/06/2014 Elsevier Interactive Patient Education  2017 [employer].

## 2016-12-10 NOTE — Progress Notes (Signed)
Dana Mcintosh to be D/C'd to home per MD order.  Discussed with the patient and all questions fully answered.  VSS, Skin clean, dry and intact without evidence of skin break down, no evidence of skin tears noted. IV catheter discontinued intact. Site without signs and symptoms of complications. Dressing and pressure applied.  An After Visit Summary was printed and given to the patient. Patient received prescription. Pt. Did not receive home medications from pharmacy, she said she would pick them up tomorrow morning.  D/c education completed with patient/family including follow up instructions, medication list, d/c activities limitations if indicated, with other d/c instructions as indicated by MD - patient able to verbalize understanding, all questions fully answered.   Patient instructed to return to ED, call 911, or call MD for any changes in condition.   Patient escorted via WC, and D/C home via private auto.  [name] Price 12/10/2016 4:29 PM

## 2016-12-10 NOTE — Progress Notes (Signed)
   Subjective:  Patient feels "fantastic" today. She slept well. She denies any dysuria or trouble eating.   Objective:  Vital signs in last 24 hours: Vitals:   12/09/16 2251 12/09/16 2253 12/10/16 0637 12/10/16 0934  BP:  112/71 128/78 121/76  Pulse:  (!) 56 63   Resp:  18 18   Temp: 98.3 F (36.8 C)  99 F (37.2 C)   TempSrc: Oral     SpO2:  100% 99%   Weight:      Height:       General: resting in bed, no acute distress Cardiac: RRR, no rubs, murmurs or gallops Pulm: clear to auscultation bilaterally, moving normal volumes of air Abd: soft, no CVA tenderness Ext: warm, no pedal edema Neuro: alert and oriented X3   Assessment/Plan:  Principal Problem:   Acute pyelonephritis Active Problems:   Chronic hepatitis C (HCC)   Polysubstance abuse   Essential hypertension, benign   Solitary pulmonary nodule  [name] is a 66 year old female with PMH of Untreated Chronic Hepatitis C (RNA 12,100,100 on 11/03/2015), HTN, Polysubstance Abuse (hx of alcohol and cocaine), Tobacco use, and solitary right lower lobe pulmonary nodule who presents with clinical suspicion of acute pyelonephritis.  Acute Pyelonephritis: Patient had a mild fever last night with temp of 100.9 F. Influenza screen was obtained which is negative. Urine culture from initial hospital visit has grown >100,000 colonies of gram negative rods consistent with a clinical picture of acute pyelonephritis. She has improved symptomatically with current therapy on IV Ceftriaxone. Will plan on discharge today. - Give Ceftriaxone 1 gram today (Day #4 of antibiotics) --> transition to oral Cefdinir 300 mg po BID to complete 10 total days starting tomorrow (last day 4/22) - will send prescription to [facility] - f/u Urine Culture sensitivities  Pulmonary Nodule: Solitary right lower lobe pulmonary nodule 7 mm in diameter seen on CT abd/pelvis in August 2015. She is a smoker with ~ 23 pack year history. Repeat CT  Chest on 4/15 shows an unchanged 7 mm right lower lobe pulmonary nodule.  Chronic Untreated Hepatitis C: Last RNA 12,100,100 on 11/03/2015. CT abd/pelvis in August 2015 with imaging findings suggestive of cirrhosis. She was previously referred to ID, but did not follow up. She has a chronic transaminitis that is slightly elevated from prior in the setting of acute illness. - f/u with ID - f/u with  [facility] to establish primary care  Thrombocytopenia: Platelets 106,000 on 4/15 compared to 104,000 six months ago. Suspect this is related to her underlying hepatic dysfunction. She is not having any obvious bleeding. - Continue to monitor  HTN:  - Continue home Lisinopril-HCTZ 10-12.5 mg daily  Hx Polysubstance Abuse: Patient reports 60 days of sobriety. UDS was negative on admission. Do not suspect she is at risk for withdrawal.  Dispo: Anticipated discharge today.   Dana Mclean, MD 12/10/2016, 11:32 AM

## 2016-12-11 LAB — URINE CULTURE: Culture: >=100000 — AB

## 2016-12-12 LAB — CULTURE, BLOOD (ROUTINE X 2)
Culture: NO GROWTH
Culture: NO GROWTH
Special Requests: ADEQUATE
Special Requests: ADEQUATE

## 2016-12-14 ENCOUNTER — Ambulatory Visit (INDEPENDENT_AMBULATORY_CARE_PROVIDER_SITE_OTHER): Payer: Medicaid Other | Admitting: Family Medicine

## 2016-12-14 ENCOUNTER — Encounter: Payer: Self-pay | Admitting: Family Medicine

## 2016-12-14 VITALS — BP 140/86 | HR 60 | Temp 97.8°F | Ht 66.5 in | Wt 141.0 lb

## 2016-12-14 DIAGNOSIS — F17200 Nicotine dependence, unspecified, uncomplicated: Secondary | ICD-10-CM

## 2016-12-14 DIAGNOSIS — Z23 Encounter for immunization: Secondary | ICD-10-CM

## 2016-12-14 DIAGNOSIS — I10 Essential (primary) hypertension: Secondary | ICD-10-CM

## 2016-12-14 DIAGNOSIS — G629 Polyneuropathy, unspecified: Secondary | ICD-10-CM

## 2016-12-14 DIAGNOSIS — R911 Solitary pulmonary nodule: Secondary | ICD-10-CM | POA: Diagnosis not present

## 2016-12-14 DIAGNOSIS — R0602 Shortness of breath: Secondary | ICD-10-CM | POA: Diagnosis not present

## 2016-12-14 DIAGNOSIS — G4700 Insomnia, unspecified: Secondary | ICD-10-CM

## 2016-12-14 DIAGNOSIS — Z1211 Encounter for screening for malignant neoplasm of colon: Secondary | ICD-10-CM | POA: Diagnosis not present

## 2016-12-14 DIAGNOSIS — B182 Chronic viral hepatitis C: Secondary | ICD-10-CM

## 2016-12-14 DIAGNOSIS — R400 Somnolence: Secondary | ICD-10-CM

## 2016-12-14 LAB — POCT URINALYSIS DIP (DEVICE)
BILIRUBIN URINE: NEGATIVE
Glucose, UA: NEGATIVE mg/dL
HGB URINE DIPSTICK: NEGATIVE
Ketones, ur: NEGATIVE mg/dL
LEUKOCYTES UA: NEGATIVE
Nitrite: NEGATIVE
PH: 7 (ref 5.0–8.0)
Protein, ur: NEGATIVE mg/dL
SPECIFIC GRAVITY, URINE: 1.02 (ref 1.005–1.030)
Urobilinogen, UA: 2 mg/dL — ABNORMAL HIGH (ref 0.0–1.0)

## 2016-12-14 MED ORDER — TRAZODONE HCL 50 MG PO TABS: 25.0000 mg | ORAL_TABLET | Freq: Every evening | ORAL | 0 refills | Status: DC | PRN

## 2016-12-14 MED ORDER — GABAPENTIN 300 MG PO CAPS: 300.0000 mg | ORAL_CAPSULE | Freq: Three times a day (TID) | ORAL | 5 refills | Status: DC

## 2016-12-14 MED ORDER — LISINOPRIL-HYDROCHLOROTHIAZIDE 10-12.5 MG PO TABS: 1.0000 | ORAL_TABLET | Freq: Every day | ORAL | 5 refills | Status: DC

## 2016-12-14 MED ORDER — NICOTINE 21 MG/24HR TD PT24: 21.0000 mg | MEDICATED_PATCH | Freq: Every day | TRANSDERMAL | 5 refills | Status: DC

## 2016-12-14 NOTE — Progress Notes (Signed)
Subjective:    Patient ID: Dana Mcintosh, female    DOB: Sep 08, 1950, 66 y.o.   MRN: [id number]  HPI Ms. Jarrod Bodkins, a 66 year old female with a history of hypertension, tobacco abuse and hepatitis C presents to establish care. Ms. Sampley says that she was a patient of [facility], but has been lost to follow up. She has a history of hypertension. She says that she has been taking lisinopril-hydrochlorothiazide consistently. She follows a low sodium diet and remains active. She does not check blood pressures at home. She currently denies dizziness, fatigue, edema, heart palpitations, chest pain, nausea, vomiting, or diarrhea.   She is complaining of insomnia. She has difficulty falling asleep and staying asleep. She says that she has excessive daytime sleepiness. She does not have a sleep routine and typically sleeps with a television on. She gets around 2-3 hours of sleep per night.   Patient also has a history of hepatitis C. She says that she has not been followed by infectious disease. She has a history of alcohol and polysubstance abuse. She has been drug free for greater than 60 days.   Past Medical History:  Diagnosis Date  . Depression   . ETOH abuse   . Hepatitis C   . Hypertension   . Mental disorder   . Substance abuse    Social History   Social History  . Marital status: Divorced    Spouse name: N/A  . Number of children: N/A  . Years of education: N/A   Occupational History  . Not on file.   Social History Main Topics  . Smoking status: Current Every Day Smoker    Packs/day: 0.50    Years: 40.00    Types: Cigarettes  . Smokeless tobacco: Never Used  . Alcohol use No     Comment: quit feb 19th, 2018  . Drug use: Yes    Frequency: 7.0 times per week    Types: Cocaine, Marijuana, "Crack" cocaine     Comment: quit feb 19th, 2018  . Sexual activity: No   Other Topics Concern  . Not on file   Social History Narrative  . No narrative on file     Immunization History  Administered Date(s) Administered  . Pneumococcal Polysaccharide-23 12/14/2016  . Tdap 12/14/2016    Review of Systems  Constitutional: Positive for fatigue.  HENT: Negative.   Eyes: Negative.   Respiratory: Positive for shortness of breath.   Cardiovascular: Negative for chest pain, palpitations and leg swelling.  Gastrointestinal: Negative.   Endocrine: Negative.  Negative for polydipsia, polyphagia and polyuria.  Genitourinary: Negative.   Musculoskeletal: Negative.   Allergic/Immunologic: Positive for immunocompromised state (hepatitis C).  Neurological: Negative.   Psychiatric/Behavioral: Positive for sleep disturbance.       Objective:   Physical Exam  Constitutional: She is oriented to person, place, and time. She appears well-developed and well-nourished. No distress.  HENT:  Head: Normocephalic and atraumatic.  Right Ear: External ear normal.  Left Ear: External ear normal.  Nose: Nose normal.  Mouth/Throat: Oropharynx is clear and moist.  Eyes: Conjunctivae and EOM are normal. Pupils are equal, round, and reactive to light.  Neck: Normal range of motion. Neck supple. No tracheal deviation present. No thyromegaly present.  Cardiovascular: Normal rate, normal heart sounds and intact distal pulses.  Exam reveals no gallop.   No murmur heard. Pulmonary/Chest: Effort normal and breath sounds normal.  Abdominal: Soft. Bowel sounds are normal.  Musculoskeletal:  Normal range of motion.  Neurological: She is alert and oriented to person, place, and time. She has normal reflexes.  Skin: Skin is warm and dry.  Psychiatric: She has a normal mood and affect. Her behavior is normal. Judgment and thought content normal.      BP (!) 144/88 (BP Location: Right Arm, Patient Position: Sitting, Cuff Size: Normal)   Pulse 60   Temp 97.8 F (36.6 C) (Oral)   Ht 5' 6.5" (1.689 m)   Wt 141 lb (64 kg)   LMP 07/27/1997   SpO2 100%   BMI 22.42 kg/m   Assessment & Plan:  1. Essential hypertension, benign - POCT urinalysis dip (device) - lisinopril-hydrochlorothiazide (ZESTORETIC) 10-12.5 MG tablet; Take 1 tablet by mouth daily.  Dispense: 30 tablet; Refill: 5  2. Tobacco dependence Smoking cessation instruction/counseling given:  counseled patient on the dangers of tobacco use, advised patient to stop smoking, and reviewed strategies to maximize success - nicotine (NICODERM CQ - DOSED IN MG/24 HOURS) 21 mg/24hr patch; Place 1 patch (21 mg total) onto the skin daily.  Dispense: 28 patch; Refill: 5  3. Insomnia, unspecified type Practice good sleep hygiene.  Stick to a sleep schedule, even on weekends. Exercise is great, but not too late in the day Avoid alcoholic drinks before bed Avoid large meals and beverages late before bed Don't take naps after 3 pm. Keep power naps less than 1 hour.  Relax before bed.  Take a hot bath before bed.  Have a good sleeping environment. Get rid of anything in your bedroom that might distract you from sleep.  Adopt good sleeping posture.   - traZODone (DESYREL) 50 MG tablet; Take 0.5 tablets (25 mg total) by mouth at bedtime as needed for sleep.  Dispense: 30 tablet; Refill: 0  4. Daytime sleepiness  - Split night study; Future  5. Shortness of breath - Split night study; Future  6. Chronic hepatitis C without hepatic coma (HCC) - Ambulatory referral to Infectious Disease  7. Neuropathy - gabapentin (NEURONTIN) 300 MG capsule; Take 1 capsule (300 mg total) by mouth 3 (three) times daily. For pain  Dispense: 90 capsule; Refill: 5  8. Nodule of right lung Reviewed recent chest CT from 12/09/2016. Patient has a stable 7 mm pulmonary nodule. Will repeat chest CT in 1 year. There has not been a changes when comparing to CT from 2015.   9. Immunization due - Pneumococcal polysaccharide vaccine 23-valent greater than or equal to 2yo subcutaneous/IM  10. Need for Tdap vaccination - Tdap vaccine  greater than or equal to 7yo IM  11. Colon cancer screening  - Ambulatory referral to Gastroenterology  RTC: 1 week for a pap smear   Nolon Nations  MSN, FNP-C [facility] [address] [place], [place] [address] [phone]

## 2016-12-14 NOTE — Patient Instructions (Addendum)
Hypertension:  Continue Lisinopril-hydrochlorothiazide 10-12.5 daily. The patient is asked to make an attempt to improve diet and exercise patterns to aid in medical management of this problem.   Insomnia:  Will start a trial of Trazadone 25 mg at night for insomnia.  Sent a referral in for a sleep study.    Smoking cessation:  Nicotine patches daily. Please refrain from smoking and using Nicotine patches.

## 2016-12-16 DIAGNOSIS — R0602 Shortness of breath: Secondary | ICD-10-CM | POA: Insufficient documentation

## 2016-12-16 DIAGNOSIS — R400 Somnolence: Secondary | ICD-10-CM | POA: Insufficient documentation

## 2016-12-16 DIAGNOSIS — G4700 Insomnia, unspecified: Secondary | ICD-10-CM | POA: Insufficient documentation

## 2016-12-16 DIAGNOSIS — F17200 Nicotine dependence, unspecified, uncomplicated: Secondary | ICD-10-CM | POA: Insufficient documentation

## 2016-12-21 ENCOUNTER — Encounter: Payer: Self-pay | Admitting: Family Medicine

## 2016-12-21 ENCOUNTER — Ambulatory Visit (INDEPENDENT_AMBULATORY_CARE_PROVIDER_SITE_OTHER): Payer: Medicaid Other | Admitting: Family Medicine

## 2016-12-21 ENCOUNTER — Other Ambulatory Visit (HOSPITAL_COMMUNITY)
Admission: RE | Admit: 2016-12-21 | Discharge: 2016-12-21 | Disposition: A | Discharge status: Home / Self Care | Payer: Medicaid Other | Source: Ambulatory Visit | Attending: Family Medicine | Admitting: Family Medicine

## 2016-12-21 VITALS — BP 142/82 | HR 56 | Temp 97.6°F | Resp 16 | Ht 67.0 in | Wt 144.0 lb

## 2016-12-21 DIAGNOSIS — Z01419 Encounter for gynecological examination (general) (routine) without abnormal findings: Secondary | ICD-10-CM | POA: Insufficient documentation

## 2016-12-21 DIAGNOSIS — Z1231 Encounter for screening mammogram for malignant neoplasm of breast: Secondary | ICD-10-CM

## 2016-12-21 DIAGNOSIS — Z1239 Encounter for other screening for malignant neoplasm of breast: Secondary | ICD-10-CM

## 2016-12-21 DIAGNOSIS — F17200 Nicotine dependence, unspecified, uncomplicated: Secondary | ICD-10-CM | POA: Diagnosis not present

## 2016-12-21 LAB — POCT URINALYSIS DIP (DEVICE)
BILIRUBIN URINE: NEGATIVE
Glucose, UA: NEGATIVE mg/dL
HGB URINE DIPSTICK: NEGATIVE
Ketones, ur: NEGATIVE mg/dL
LEUKOCYTES UA: NEGATIVE
NITRITE: NEGATIVE
PH: 7 (ref 5.0–8.0)
Protein, ur: NEGATIVE mg/dL
Specific Gravity, Urine: 1.015 (ref 1.005–1.030)
UROBILINOGEN UA: 4 mg/dL — AB (ref 0.0–1.0)

## 2016-12-21 NOTE — Progress Notes (Signed)
[name], a 66 year old patient with a history of hepatitis C, alcohol abuse and hypertension presents for a routine gynecological exam. It has been many years since last pap smear and mammogram.    Gynecologic Exam  The patient's pertinent negatives include no genital itching, genital lesions, genital odor, genital rash, missed menses, pelvic pain, vaginal bleeding or vaginal discharge. The patient is experiencing no pain. She is not pregnant. Pertinent negatives include no abdominal pain, anorexia, back pain, chills, constipation, diarrhea, discolored urine, dysuria, fever, flank pain, frequency, headaches, hematuria, joint pain, joint swelling, nausea, painful intercourse, rash, sore throat, urgency or vomiting. She is not sexually active. No, her partner does not have an STD. She is postmenopausal.   Past Medical History:  Diagnosis Date  . Depression   . ETOH abuse   . Hepatitis C   . Hypertension   . Mental disorder   . Substance abuse    Social History   Social History  . Marital status: Divorced    Spouse name: N/A  . Number of children: N/A  . Years of education: N/A   Occupational History  . Not on file.   Social History Main Topics  . Smoking status: Current Every Day Smoker    Packs/day: 0.50    Years: 40.00    Types: Cigarettes  . Smokeless tobacco: Never Used  . Alcohol use No     Comment: quit feb 19th, 2018  . Drug use: Yes    Frequency: 7.0 times per week    Types: Cocaine, Marijuana, "Crack" cocaine     Comment: quit feb 19th, 2018  . Sexual activity: No   Other Topics Concern  . Not on file   Social History Narrative  . No narrative on file   Immunization History  Administered Date(s) Administered  . Pneumococcal Polysaccharide-23 12/14/2016  . Tdap 12/14/2016   Allergies  Allergen Reactions  . Penicillins Hives   Review of Systems  Constitutional: Negative for chills, fever, malaise/fatigue and weight loss.  HENT: Negative.  Negative  for sore throat.   Eyes: Negative.   Cardiovascular: Negative.  Negative for chest pain.  Gastrointestinal: Negative for abdominal pain, anorexia, constipation, diarrhea, nausea and vomiting.  Genitourinary: Negative.  Negative for dysuria, flank pain, frequency, hematuria, missed menses, pelvic pain, urgency and vaginal discharge.  Musculoskeletal: Negative.  Negative for back pain and joint pain.  Skin: Negative.  Negative for rash.  Neurological: Negative.  Negative for headaches.  Endo/Heme/Allergies: Negative.    Physical Exam  Constitutional: She is well-developed, well-nourished, and in no distress.  HENT:  Head: Normocephalic and atraumatic.  Right Ear: External ear normal.  Left Ear: External ear normal.  Nose: Nose normal.  Mouth/Throat: Oropharynx is clear and moist.  Eyes: Conjunctivae and EOM are normal. Pupils are equal, round, and reactive to light.  Neck: Normal range of motion. Neck supple.  Cardiovascular: Normal rate, regular rhythm, normal heart sounds and intact distal pulses.   Pulmonary/Chest: Effort normal and breath sounds normal.  Abdominal: Soft. Bowel sounds are normal.  Genitourinary: Uterus normal, cervix normal, right adnexa normal, left adnexa normal and vulva normal. Right adnexum displays no deviation, no mass and no tenderness. Left adnexum displays no deviation, no mass and no tenderness. Vagina exhibits normal mucosa and no exudate. Watery and vaginal discharge found.  Neurological: She is alert.  Skin: Skin is warm and dry.  Psychiatric: Mood, memory, affect and judgment normal.   Plan    BP (!) 142/82 (  BP Location: Right Arm, Patient Position: Sitting, Cuff Size: Normal)   Pulse (!) 56   Temp 97.6 F (36.4 C) (Oral)   Resp 16   Ht  (1.702 m)   Wt 144 lb (65.3 kg)   LMP 07/27/1997   SpO2 100%   BMI 22.55 kg/m     1. Papanicolaou smear, as part of routine gynecological examination Will follow up by phone with laboratory results.   - Cytology - PAP [place] - POCT urinalysis dip (device)  2. Breast cancer screening - MM DIGITAL SCREENING BILATERAL; Future  3. Tobacco use disorder Patient was started on Nicotine patches 1 week ago. Smoking cessation instruction/counseling given:  counseled patient on the dangers of tobacco use, advised patient to stop smoking, and reviewed strategies to maximize success   RTC 3 months for chronic conditions  [name]  MSN, FNP-C [facility] [address] [place], [place] [address] [phone]

## 2016-12-21 NOTE — Patient Instructions (Addendum)
Cancer Screening for Women A cancer screening is a test or exam that checks for cancer. Your health care provider will recommend specific cancer screenings based on your age, personal history, and family history of cancer. Work with your health care provider to create a cancer screening schedule that protects your health. Why is cancer screening done? Cancer screening is done to look for cancer in the very early stages, before it spreads and becomes harder to treat and before you would start to notice symptoms. Finding cancer early improves the chances of successful treatment. It may save your life. Who should be screened for cancer? All women should be screened for certain cancers, including breast cancer, cervical cancer, and skin cancer. Your health care provider may recommend screenings for other types of cancer if:  You had cancer before.  You have a family member with cancer.  You have abnormal genes that could increase the risk of cancer.  You have risk factors for certain cancers, such as smoking. When you should be screened for cancer depends on:  Your age.  Your medical history and your family's medical history.  Certain lifestyle factors, such as smoking.  Environmental exposure, such as to asbestos. What are some common cancer screenings? Breast cancer  Breast cancer screening is done with a test that takes images of breast tissue (mammogram). Here are some screening guidelines:  When you are age 64-44. you will be given the choice to start having mammograms.  When you are age 54-54, you should have a mammogram every year.  You may start having mammograms before age 42 if you have risk factors for breast cancer, such as having an immediate family member with breast cancer.  When you are age 41 or older, you should have a mammogram every 1-2 years for as long as you are in good health and have a life expectancy of 10 years or more.  It is important to know what your  breasts look and feel like so you can report any changes to your health care provider. Cervical cancer  Cervical cancer screening is done with a Pap test. This testchecks for abnormalities, including the virus that causes cervical cancer (human papillomavirus, or HPV). To perform the test, a health care provider takes a swab of cervical cells during a pelvic exam. Screening for cervical cancer with a Pap test should start at age 63. Here are some screening guidelines:  When you are age 62-29, you should have a Pap test every 3 years.  When you are age 52-65, you should have a Pap test and HPV test every 5 years or have a Pap test every 3 years.  You may be screened for cervical cancer more often if you have risk factors for cervical cancer.  If your Pap tests are abnormal, you may have an HPV test.  If you have had the HPV vaccine, you will still be screened for cervical cancer and follow normal screening recommendations. You do not need to be screened for cervical cancer if any of the following apply to you:  You are older than age 34 and you have not had a serious cervical precancer or cancer in the last 20 years.  Your cervix and uterus have been removed and you have never had cervical cancer or precancerous cells. Endometrial cancer  There is no standard screening test for endometrial cancer, but the cancer can be detected with:  A test of a sample of tissue taken from the lining of the uterus (  endometrial tissue biopsy).  A vaginal ultrasound.  Pap tests. If you are at increased risk for endometrial cancer, you may need to have these tests more often than normal. You are at increased risk if:  You have a family history of ovarian, uterine, or colon cancer.  You are taking tamoxifen, a drug that is used to treat breast cancer.  You have certain types of colon cancer. If you have reached menopause, it is especially important to talk with your health care provider about any vaginal  bleeding or spotting. Screening for endometrial cancer is not recommended for women who do not have symptoms of the cancer, such as vaginal bleeding. Colorectal cancer  Screening for colorectal cancer is recommended starting at age 82 for most women. If you have a family history of colon or rectal cancer or other risk factors, you may need to start having screenings earlier. Talk with your health care provider about which screening test is right for you and how often you should be screened. Colorectal cancer screening looks for cancer or for growths called polyps that often form before cancer starts. Tests to look for cancer or polyps include:  Colonoscopy or flexible sigmoidoscopy. For these procedures, a flexible tube with a small camera is inserted into the rectum.  CT colonography. This test uses X-rays and a contrast dye to check the colon for polyps. If a polyp is found, you may need to have a colonoscopy so the polyp can be located and removed. Tests to look for cancer in the stool (feces) include:  Guaiac-based fecal occult blood test (FOBT). This test detects blood in stool. It can be done at home with a kit.  Fecal immunochemical test (FIT). This test detects blood in stool. For this test, you will need to collect stool samples at home.  Stool DNA test. This test looks for blood in stool and any changes in DNA that can lead to colon cancer. For this test, you will need to collect a stool sample at home and send it to a lab. Skin cancer  Skin cancer screening is done by checking the skin for unusual moles or spots and any changes in existing moles. Your health care provider should check your skin for signs of skin cancer at every physical exam. You should check your skin every month and tell your health care provider right away if anything looks unusual. Women with a higher-than-normal risk for skin cancer may want to see a skin specialist (dermatologist) for an annual body check. Lung  cancer  Lung cancer screening is done with a CT scan that looks for abnormal cells in the lungs. Discuss lung cancer screening with your health care provider if you are 60-45 years old and if any of the following apply to you:  You currently smoke.  You used to smoke heavily.  You have had at least a 30-pack-year smoking history.  You have quit smoking within the past 15 years. If you smoke heavily or if you used to smoke, you may need to be screened every year. Where to find more information:  [place]: SkinPromotion.no  Centers for Disease Control and Prevention: http://knight-sullivan.biz/  Department of Health and Human Services: BankingDetective.si Contact a health care provider if:  You have concerns about any signs or symptoms of cancer, such as:  Moles that have an unusual shape or color.  Changes in existing moles.  A sore on your skin that does not heal.  Blood in your stool.  Fatigue that does not go away.  Frequent pain or cramping in your abdomen.  Coughing, or coughing up blood.  Losing weight without trying.  Lumps or other changes in your breasts.  Vaginal bleeding, spotting, or changes in your periods. Summary  Be aware of and watch for signs and symptoms of cancer, especially symptoms of breast cancer, cervical cancer, endometrial cancer, colorectal cancer, skin cancer, and lung cancer.  Early detection of cancer with cancer screening may save your life.  Talk with your health care provider about your specific cancer risks.  Work together with your health care provider to create a cancer screening plan that is right for you. This information is not intended to replace advice given to you by your health care provider. Make sure you discuss any questions you have with your health care provider. Document Released:  05/10/2016 Document Revised: 05/10/2016 Document Reviewed: 05/10/2016 Elsevier Interactive Patient Education  2017 [employer]. Pap Test Why am I having this test? A pap test is sometimes called a pap smear. It is a screening test that is used to check for signs of cancer of the vagina, cervix, and uterus. The test can also identify the presence of infection or precancerous changes. Your health care provider will likely recommend you have this test done on a regular basis. This test may be done:  Every 3 years, starting at age 72.  Every 5 years, in combination with testing for the presence of human papillomavirus (HPV).  More or less often depending on other medical conditions. What kind of sample is taken? Using a small cotton swab, plastic spatula, or brush, your health care provider will collect a sample of cells from the surface of your cervix. Your cervix is the opening to your uterus, also called a womb. Secretions from the cervix and vagina may also be collected. How do I prepare for this test?  Be aware of where you are in your menstrual cycle. You may be asked to reschedule the test if you are menstruating on the day of the test.  You may need to reschedule if you have a known vaginal infection on the day of the test.  You may be asked to avoid douching or taking a bath the day before or the day of the test.  Some medicines can cause abnormal test results, such as digitalis and tetracycline. Talk with your health care provider before your test if you take one of these medicines. What do the results mean? Abnormal test results may indicate a number of health conditions. These may include:  Cancer. Although pap test results cannot be used to diagnose cancer of the cervix, vagina, or uterus, they may suggest the possibility of cancer. Further tests would be required to determine if cancer is present.  Sexually transmitted disease.  Fungal infection.  Parasite  infection.  Herpes infection.  A condition causing or contributing to infertility. It is your responsibility to obtain your test results. Ask the lab or department performing the test when and how you will get your results. Contact your health care provider to discuss any questions you have about your results. Talk with your health care provider to discuss your results, treatment options, and if necessary, the need for more tests. Talk with your health care provider if you have any questions about your results. This information is not intended to replace advice given to you by your health care provider. Make sure you discuss any questions you have with your health care provider.  Document Released: 11/03/2002 Document Revised: 04/18/2016 Document Reviewed: 01/04/2014 Elsevier Interactive Patient Education  2017 [employer].

## 2016-12-25 LAB — CYTOLOGY - PAP
ADEQUACY: ABSENT
Bacterial vaginitis: NEGATIVE
Candida vaginitis: POSITIVE — AB
DIAGNOSIS: NEGATIVE

## 2016-12-26 ENCOUNTER — Other Ambulatory Visit: Payer: Self-pay | Admitting: Family Medicine

## 2016-12-26 MED ORDER — FLUCONAZOLE 150 MG PO TABS: 150.0000 mg | ORAL_TABLET | Freq: Once | ORAL | 0 refills | Status: AC

## 2017-01-07 ENCOUNTER — Encounter: Payer: Self-pay | Admitting: Internal Medicine

## 2017-01-07 ENCOUNTER — Ambulatory Visit (INDEPENDENT_AMBULATORY_CARE_PROVIDER_SITE_OTHER): Payer: Medicaid Other | Admitting: Internal Medicine

## 2017-01-07 VITALS — BP 131/83 | HR 52 | Temp 97.9°F | Ht 66.5 in | Wt 139.0 lb

## 2017-01-07 DIAGNOSIS — K7460 Unspecified cirrhosis of liver: Secondary | ICD-10-CM | POA: Diagnosis present

## 2017-01-07 DIAGNOSIS — B182 Chronic viral hepatitis C: Secondary | ICD-10-CM

## 2017-01-07 NOTE — Patient Instructions (Signed)
Date 01/07/17  Dear Ms Donavan FoilBass, As discussed in the ID Clinic, your hepatitis C therapy will include highly effective medication(s) for treatment and will vary based on the type of hepatitis C and insurance approval.  We will also wait for you to continue with your rehab efforts since medicaid will not approve medication until you can sign the 'readiness' form.  Potential medications include:          Harvoni (sofosbuvir 90mg /ledipasvir 400mg ) tablet oral daily          OR     Epclusa (sofosbuvir 400mg /velpatasvir 100mg ) tablet oral daily          OR      Mavyret (glecaprevir 100 mg/pibrentasvir 40 mg): Take 3 tablets oral daily          OR     Zepatier (elbasvir 50 mg/grazoprevir 100 mg) oral daily, +/- ribavirin              Medications are typically for 8 or 12 weeks total ---------------------------------------------------------------- Your HCV Treatment Start Date: You will be notified by our office once the medication is approved and where you can pick it up (or if mailed)   ---------------------------------------------------------------- YOUR PHARMACY CONTACT:   [facility] [address] [place], [place] [address] Phone: [phone] Hours: Monday to Friday 7:30 am to 6:00 pm   Please always contact your pharmacy at least 3-4 business days before you run out of medications to ensure your next month's medication is ready or 1 week prior to running out if you receive it by mail.  Remember, each prescription is for 28 days. ---------------------------------------------------------------- GENERAL NOTES REGARDING YOUR HEPATITIS C MEDICATION:  Some medications have the following interactions:  - Acid reducing agents such as H2 blockers (ie. Pepcid (famotidine), Zantac (ranitidine), Tagamet (cimetidine), Axid (nizatidine) and proton pump inhibitors (ie. Prilosec (omeprazole), Protonix (pantoprazole), Nexium (esomeprazole), or Aciphex (rabeprazole)). Do not take until  you have discussed with a health care provider.    -Antacids that contain magnesium and/or aluminum hydroxide (ie. Milk of Magensia, Rolaids, Gaviscon, Maalox, Mylanta, an dArthritis Pain Formula).  -Calcium carbonate (calcium supplements or antacids such as Tums, Caltrate, Os-Cal).  -St. John's wort or any products that contain St. John's wort like some herbal supplements  Please inform the office prior to starting any of these medications.  - The common side effects associated with Harvoni include:      1. Fatigue      2. Headache      3. Nausea      4. Diarrhea      5. Insomnia  Please note that this only lists the most common side effects and is NOT a comprehensive list of the potential side effects of these medications. For more information, please review the drug information sheets that come with your medication package from the pharmacy.  ---------------------------------------------------------------- GENERAL HELPFUL HINTS ON HCV THERAPY: 1. Stay well-hydrated. 2. Notify the ID Clinic of any changes in your other over-the-counter/herbal or prescription medications. 3. If you miss a dose of your medication, take the missed dose as soon as you remember. Return to your regular time/dose schedule the next day.  4.  Do not stop taking your medications without first talking with your healthcare provider. 5.  You may take Tylenol (acetaminophen), as long as the dose is less than 2000 mg (OR no more than 4 tablets of the Tylenol Extra Strengths 500mg  tablet) in 24 hours. 6.  You will see our pharmacist-specialist  within the first 2 weeks of starting your medication to monitor for any possible side effects. 7.  You will have labs once during treatment, soon after treatment completion and one final lab 6 months after treatment completion to verify the virus is out of your system.  Staci Righter, MD  [facility] [address]  [place] [address] [place], [place]  [address] [phone]

## 2017-01-07 NOTE — Addendum Note (Signed)
Addended by: Gardiner BarefootOMER, ROBERT W on: 01/07/2017 11:21 AM   Modules accepted: Orders

## 2017-01-07 NOTE — Progress Notes (Signed)
[facility]   CC: consideration for treatment for chronic hepatitis C  HPI:  +Dana Mcintosh is a 66 y.o. female who presents for initial evaluation and management of chronic hepatitis C.  Patient tested positive 1 year ago during screening with her risk factors. Hepatitis C-associated risk factors present are: tattoos (details: smoked crack). Patient denies IV drug abuse, sexual contact with person with liver disease, tattoos. Patient has had other studies performed. Results: hepatitis C RNA by PCR, result: positive. Patient has not had prior treatment for Hepatitis C. Patient does not have a past history of liver disease. Patient does not have a family history of liver disease. Patient does  have associated signs or symptoms related to liver disease.  Labs reviewed and confirm chronic hepatitis C with a positive viral load.   Records reviewed from Epic.  She was referred 1 year ago but no showed the appt. She did get labs then and was genotype 1a.   She currently has been alcohol free over 3 months and drug free since February.      Patient does not have documented immunity to Hepatitis A. Patient does not have documented immunity to Hepatitis B.    Review of Systems:  Constitutional: negative for fatigue and malaise Gastrointestinal: negative for diarrhea Integument/breast: negative for rash All other systems reviewed and are negative       Past Medical History:  Diagnosis Date  . Depression   . ETOH abuse   . Hepatitis C   . Hypertension   . Mental disorder   . Substance abuse     Prior to Admission medications   Medication Sig Start Date End Date Taking? Authorizing Provider  gabapentin (NEURONTIN) 300 MG capsule Take 1 capsule (300 mg total) by mouth 3 (three) times daily. For pain 12/14/16  Yes Massie MaroonHollis, Lachina M, FNP  lisinopril-hydrochlorothiazide (ZESTORETIC) 10-12.5 MG tablet Take 1 tablet by mouth daily. 12/14/16 12/14/17 Yes Massie MaroonHollis, Lachina  M, FNP  nicotine (NICODERM CQ - DOSED IN MG/24 HOURS) 21 mg/24hr patch Place 1 patch (21 mg total) onto the skin daily. 12/14/16  Yes Massie MaroonHollis, Lachina M, FNP  omeprazole (PRILOSEC) 20 MG capsule Take 1 capsule (20 mg total) by mouth daily. 07/24/16 07/24/17 Yes Eulah PontBlum, Nina, MD  traZODone (DESYREL) 50 MG tablet Take 0.5 tablets (25 mg total) by mouth at bedtime as needed for sleep. 12/14/16  Yes Massie MaroonHollis, Lachina M, FNP    Allergies  Allergen Reactions  . Penicillins Hives    Social History  Substance Use Topics  . Smoking status: Current Every Day Smoker    Packs/day: 0.50    Years: 40.00    Types: Cigarettes  . Smokeless tobacco: Never Used     Comment: on nicotene patch  . Alcohol use No     Comment: quit feb 19th, 2018    Family History  Problem Relation Age of Onset  . Diabetes Mother   . Hypertension Mother   . Cancer Father   sister with cirrhosis    Objective:  Constitutional: in no apparent distress and alert,  Vitals:   01/07/17 0840  BP: 131/83  Pulse: (!) 52  Temp: 97.9 F (36.6 C)   Eyes: anicteric Cardiovascular: Cor RRR and No murmurs Respiratory: CTA B; normal respiratory effort Gastrointestinal: Bowel sounds are normal, liver is not enlarged, spleen is not enlarged Musculoskeletal: no pedal edema noted Skin: negatives: no rash; no porphyria cutanea tarda Lymphatic: no cervical lymphadenopathy   Laboratory  Genotype: No results found for: HCVGENOTYPE HCV viral load: No results found for: HCVQUANT Lab Results  Component Value Date   WBC 6.6 12/09/2016   HGB 11.5 (L) 12/09/2016   HCT 33.3 (L) 12/09/2016   MCV 92.0 12/09/2016   PLT 106 (L) 12/09/2016    Lab Results  Component Value Date   CREATININE 0.83 12/09/2016   BUN 9 12/09/2016   NA 138 12/09/2016   K 3.5 12/09/2016   CL 107 12/09/2016   CO2 23 12/09/2016    Lab Results  Component Value Date   ALT 157 (H) 12/07/2016   AST 182 (H) 12/07/2016   ALKPHOS 64 12/07/2016     Labs and  history reviewed and show CHILD-PUGH unknown - her last INR was in September 2017 and based on that, she would be Child Pugh B.    5-6 points: Child class A 7-9 points: Child class B 10-15 points: Child class C  Lab Results  Component Value Date   INR 1.21 05/26/2016   BILITOT 1.0 12/07/2016   ALBUMIN 3.2 (L) 12/07/2016     Assessment: New Patient with Chronic Hepatitis C genotype 1a, untreated.  I discussed with the patient the lab findings that confirm chronic hepatitis C as well as the natural history and progression of disease including about 30% of people who develop cirrhosis of the liver if left untreated and once cirrhosis is established there is a 2-7% risk per year of liver cancer and liver failure.  I discussed the importance of treatment and benefits in reducing the risk, even if significant liver fibrosis exists.   Plan: 1) Patient counseled extensively on limiting acetaminophen to no more than 2 grams daily, avoidance of alcohol. 2) Transmission discussed with patient including sexual transmission, sharing razors and toothbrush.   3) Will need referral to gastroenterology if concern for cirrhosis 4) Will need referral for substance abuse counseling: Yes.  ; Further work up to include urine drug screen  No. 5) Will prescribe appropriate medication based on genotype and coverage - has medicaid 6) Hepatitis A and B titers 7) Pneumovax vaccine previously given 9) Further work up to include liver staging with elastography 10) will follow up after 3 months to continue rehab efforts.  As long as she remains drug free 3 more months, she is eligible to sign the readiness form and start the process for medication.   11) I will repeat the viral load today to be sure still active 12) will check her INR to determine her child pugh score as it is close to Child Pugh B.  If so, willl need alternative to Mavyret.   13) will follow up with pharmD in 3 months to initiate the process if she  remains drug free.

## 2017-01-08 LAB — HEPATITIS B CORE ANTIBODY, TOTAL: Hep B Core Total Ab: REACTIVE — AB

## 2017-01-08 LAB — HIV ANTIBODY (ROUTINE TESTING W REFLEX): HIV 1&2 Ab, 4th Generation: NONREACTIVE

## 2017-01-08 LAB — HEPATITIS A ANTIBODY, TOTAL: HEP A TOTAL AB: REACTIVE — AB

## 2017-01-08 LAB — HEPATITIS B SURFACE ANTIGEN: Hepatitis B Surface Ag: NEGATIVE

## 2017-01-08 LAB — HEPATITIS B SURFACE ANTIBODY,QUALITATIVE: Hep B S Ab: NEGATIVE

## 2017-01-09 LAB — HEPATITIS C RNA QUANTITATIVE
HCV QUANT LOG: 6.78 {Log_IU}/mL — AB
HCV QUANT: 6050000 [IU]/mL — AB

## 2017-01-10 ENCOUNTER — Telehealth: Payer: Self-pay | Admitting: *Deleted

## 2017-01-10 NOTE — Telephone Encounter (Signed)
Called to be sure that patient knew about [facility] at 9:15 AM. The prior authorization was done by this Imaging facility and has been approved through 02/08/17. Auth # [id number]. She is aware of appt date, time, and address. Nothing to eat or drink after midnight. Wendall MolaJacqueline Brad Lieurance

## 2017-01-15 ENCOUNTER — Ambulatory Visit: Payer: Self-pay | Admitting: Family Medicine

## 2017-01-17 ENCOUNTER — Other Ambulatory Visit: Payer: Self-pay

## 2017-01-22 ENCOUNTER — Other Ambulatory Visit: Payer: Self-pay | Admitting: Family Medicine

## 2017-01-22 DIAGNOSIS — G4700 Insomnia, unspecified: Secondary | ICD-10-CM

## 2017-01-31 ENCOUNTER — Encounter (HOSPITAL_BASED_OUTPATIENT_CLINIC_OR_DEPARTMENT_OTHER): Payer: Self-pay

## 2017-02-12 ENCOUNTER — Ambulatory Visit (INDEPENDENT_AMBULATORY_CARE_PROVIDER_SITE_OTHER): Payer: Medicaid Other | Admitting: Family Medicine

## 2017-02-12 ENCOUNTER — Other Ambulatory Visit: Payer: Self-pay

## 2017-02-12 ENCOUNTER — Encounter: Payer: Self-pay | Admitting: Family Medicine

## 2017-02-12 VITALS — BP 110/75 | HR 55 | Temp 98.2°F | Resp 16 | Ht 66.5 in | Wt 141.0 lb

## 2017-02-12 DIAGNOSIS — S99922A Unspecified injury of left foot, initial encounter: Secondary | ICD-10-CM | POA: Diagnosis not present

## 2017-02-12 DIAGNOSIS — M79672 Pain in left foot: Secondary | ICD-10-CM

## 2017-02-12 DIAGNOSIS — M7989 Other specified soft tissue disorders: Secondary | ICD-10-CM | POA: Diagnosis not present

## 2017-02-12 MED ORDER — NAPROXEN 500 MG PO TABS: 500.0000 mg | ORAL_TABLET | Freq: Two times a day (BID) | ORAL | 0 refills | Status: DC

## 2017-02-12 MED ORDER — TRAMADOL HCL 50 MG PO TABS: 50.0000 mg | ORAL_TABLET | Freq: Three times a day (TID) | ORAL | 0 refills | Status: DC | PRN

## 2017-02-12 MED ORDER — KETOROLAC TROMETHAMINE 60 MG/2ML IM SOLN
60.0000 mg | Freq: Once | INTRAMUSCULAR | Status: AC
  Administered 2017-02-12: 60 mg via INTRAMUSCULAR

## 2017-02-12 NOTE — Telephone Encounter (Signed)
Sent rx into correct pharmacy. [facility]. Thanks!

## 2017-02-12 NOTE — Patient Instructions (Addendum)
Left Foot Injury:  Will obtain an xray of left foot. Will follow up by phone after reviewing results Apply ice pack to left foot 20 minutes 4 times per day as needed. Use interchangeably with heat Elevate Left foot to heart level while at rest Naproxyn 500 mg twice daily as needed for mild to moderate foot pain. Refrain from taking medication for the next 8 hours. Received Toradol 60 mg IM in office Tramadol 50 mg every 8 hours as needed for moderate to severe pain as needed. Refrain from drinking alcohol, driving, or operating machinery while taking medication.  Scab to left great toe, apply Neosporin as needed  Foot Pain Many things can cause foot pain. Some common causes are:  An injury.  A sprain.  Arthritis.  Blisters.  Bunions.  Follow these instructions at home: Pay attention to any changes in your symptoms. Take these actions to help with your discomfort:  If directed, put ice on the affected area: ? Put ice in a plastic bag. ? Place a towel between your skin and the bag. ? Leave the ice on for 15-20 minutes, 3?4 times a day for 2 days.  Take over-the-counter and prescription medicines only as told by your health care provider.  Wear comfortable, supportive shoes that fit you well. Do not wear high heels.  Do not stand or walk for long periods of time.  Do not lift a lot of weight. This can put added pressure on your feet.  Do stretches to relieve foot pain and stiffness as told by your health care provider.  Rub your foot gently.  Keep your feet clean and dry.  Contact a health care provider if:  Your pain does not get better after a few days of self-care.  Your pain gets worse.  You cannot stand on your foot. Get help right away if:  Your foot is numb or tingling.  Your foot or toes are swollen.  Your foot or toes turn white or blue.  You have warmth and redness along your foot. This information is not intended to replace advice given to you by  your health care provider. Make sure you discuss any questions you have with your health care provider. Document Released: 09/09/2015 Document Revised: 01/19/2016 Document Reviewed: 09/08/2014 Elsevier Interactive Patient Education  2018 [employer].  Crush Injury of the Foot When a crush injury of the foot occurs, many structures within the foot and ankle joint can be affected. This can result in a complicated injury that may involve:  One or more broken (fractured) bones.  Lacerations or abrasions of the skin. These increase your risk of infection.  Compressed or torn muscles.  Torn ligaments and tendons.  Broken blood vessels, causing bleeding within the tissues. This can lead to dangerously high pressure within the tissues (compartment syndrome).  Damage to nerves.  One or more toe amputations.  What are the causes? This type of injury can happen when a great amount of force is suddenly applied to the foot. This might occur:  During a motor vehicle accident.  If the foot is pulled into a machine during industrial or agricultural work.  If a heavy load falls directly onto the foot.  What are the signs or symptoms? Symptoms will vary depending on which structures in your foot have been injured. Symptoms may include:  Moderate or severe pain in the foot, ankle, or leg.  Bleeding at the site of injury.  Tingling, numbness, or loss of feeling (sensation) in  part or all of your foot.  Loss of movement in part or all of your foot.  How is this diagnosed? Your health care provider will examine you and ask questions about how your injury happened. The exam may include checking for sensation and blood flow into your foot. You may also have tests, including X-rays and procedures to check the pressure in your foot. After initial treatment, additional studies may be done to further diagnose the extent of your injuries. These may include:  A nerve conduction study to determine  how well the nerves are working in your leg and foot.  MRI to determine if other injuries occurred that do not usually show up on X-ray.  How is this treated? Treatment for this condition depends on the severity of your crush injury. Treatment may include:  Thorough cleaning if you have an open wound. This may or may not require surgery.  Having a splint applied to your foot or leg.  Medicine to relieve pain.  Antibiotic medicine to prevent infection.  Stitches (sutures) to close open wounds.  One or more surgeries to address injuries to skin, bones, joints, tendons, ligaments, muscles, nerves, or blood vessels.  Follow these instructions at home: If you have a splint:  Wear the splint as told by your health care provider. Remove it only as told by your health care provider.  Loosen the splint if your toes tingle, become numb, or turn cold and blue.  Do not let your splint get wet if it is not waterproof.  Keep the splint clean. Wound care   If you have any skin wounds that were covered with bandages (dressings), follow instructions from your health care provider about how to take care of your wound. Make sure you: ? Wash your hands with soap and water before you change your dressing. If soap and water are not available, use hand sanitizer. ? Change your dressing as told by your health care provider. ? Leave stitches (sutures), skin glue, or adhesive strips in place. These skin closures may need to stay in place for 2 weeks or longer. If adhesive strip edges start to loosen and curl up, you may trim the loose edges. Do not remove adhesive strips completely unless your health care provider tells you to do that.  If you have skin wounds, check them every day for signs of infection. Check for: ? More redness, swelling, or pain. ? More fluid or blood. ? Warmth. ? Pus or a bad smell. Managing pain, stiffness, and swelling  If directed, put ice on the injured area. ? Put ice in  a plastic bag. ? Place a towel between your skin and the bag. ? Leave the ice on for 20 minutes, 2-3 times a day.  Raise (elevate) the injured area above the level of your heart while you are sitting or lying down. Driving  Do not drive or operate heavy machinery while taking prescription pain medicine.  Ask your health care provider when it is safe to drive if you have a splint on your foot or leg. Activity  Return to your normal activities as told by your health care provider. Ask your health care provider what activities are safe for you.  Work with a physical therapist (PT) or occupational therapist (OT) as told by your health care provider. General instructions  Do not put pressure on any part of the splint until it is fully hardened. This may take several hours.  If you have a splint and  it is not waterproof, cover it with a watertight plastic bag when you take a bath or a shower.  Take over-the-counter and prescription medicines only as told by your health care provider.  If you were prescribed an antibiotic, take it as told by your health care provider. Do not stop taking the antibiotic before the prescription is done.  Do not use any tobacco products, such as cigarettes, chewing tobacco, and e-cigarettes. Tobacco can delay bone healing. If you need help quitting, ask your health care provider.  Keep all follow-up visits as told by your health care provider. This is important. These include PT and OT visits. Contact a health care provider if:  A wound that was sutured opens up.  You have more redness, swelling, or pain in your foot.  You have more fluid or blood coming from your foot.  Your foot feels warm to the touch.  You have pus or a bad smell coming from your foot.  You have a fever. Get help right away if:  You suddenly develop severe pain in your foot.  You previously had sensation in your foot and you suddenly lose sensation.  Your symptoms had improved  and they suddenly get worse.  Your foot or toes are turning pink or blue. This information is not intended to replace advice given to you by your health care provider. Make sure you discuss any questions you have with your health care provider. Document Released: 07/25/2015 Document Revised: 01/19/2016 Document Reviewed: 04/06/2015 Elsevier Interactive Patient Education  [employer].

## 2017-02-12 NOTE — Progress Notes (Signed)
[name], a 65 year old female presents complaining of left foot pain. She says that she was shopping at [employer] and sustained an injury. An employee with a buffer machine accidentally ran over her left foot. She says that she did not go to the emergency department or urgent care directly after the accident, but pain continued to increase over the past several day.   Foot Injury   The incident occurred 5 to 7 days ago. Incident location: Sustained an injury to left foot at [employer]. The injury mechanism was a direct blow. The pain is present in the left ankle. The quality of the pain is described as aching. The pain is at a severity of 8/10. The pain is moderate. The pain has been worsening since onset. Associated symptoms include an inability to bear weight. She has tried elevation, heat and ice for the symptoms. The treatment provided no relief.   Past Medical History:  Diagnosis Date  . Depression   . ETOH abuse   . Hepatitis C   . Hypertension   . Mental disorder   . Substance abuse    Social History   Social History  . Marital status: Divorced    Spouse name: N/A  . Number of children: N/A  . Years of education: N/A   Occupational History  . Not on file.   Social History Main Topics  . Smoking status: Current Every Day Smoker    Packs/day: 0.50    Years: 40.00    Types: Cigarettes  . Smokeless tobacco: Never Used     Comment: on nicotene patch  . Alcohol use No     Comment: quit feb 19th, 2018  . Drug use: Yes    Frequency: 7.0 times per week    Types: Cocaine, Marijuana, "Crack" cocaine     Comment: quit feb 19th, 2018  . Sexual activity: No   Other Topics Concern  . Not on file   Social History Narrative  . No narrative on file  Review of Systems  Constitutional: Negative.   HENT: Negative.   Respiratory: Negative.   Cardiovascular: Negative.   Musculoskeletal: Negative.        Left foot pain  Neurological: Negative.   Psychiatric/Behavioral:  Negative.    Physical Exam  Constitutional: She is oriented to person, place, and time.  Eyes: Pupils are equal, round, and reactive to light.  Cardiovascular: Regular rhythm, normal heart sounds and intact distal pulses.   Pulmonary/Chest: Effort normal and breath sounds normal.  Abdominal: Soft. Bowel sounds are normal.  Musculoskeletal:       Right shoulder: She exhibits decreased range of motion, tenderness and swelling (Swelling to anterior aspect of left foot).       Left ankle: She exhibits decreased range of motion and swelling. She exhibits normal pulse.  Neurological: She is alert and oriented to person, place, and time. Gait normal.  Skin: Skin is warm and dry.  Scab to left great toe     Plan   1. Foot injury, left, initial encounter Will review an xray of the left foot to rule out fracture or any acute abnormalities - DG Foot Complete Left; Future  2. Left foot pain Will start Tramadol for moderate to severe left foot pain. Recommend that she elevate extremity to heart level while at rest. Apply cool compresses to left foot and use interchangeably with warm compresses.   - ketorolac (TORADOL) injection 60 mg; Inject 2 mLs (60 mg total)  into the muscle once. - traMADol (ULTRAM) 50 MG tablet; Take 1 tablet (50 mg total) by mouth every 8 (eight) hours as needed.  Dispense: 15 tablet; Refill: 0  3. Foot swelling Elevate left foot to heart level while at rest and apply cool compresses 20 minutes 4 times per day as needed.    RTC: Will follow up by phone after reviewing xray   Nolon NationsLaChina Moore Adilynn Bessey  MSN, FNP-C [facility] [address]  [place], [place] [address] [phone]

## 2017-02-13 ENCOUNTER — Ambulatory Visit (HOSPITAL_COMMUNITY)
Admission: RE | Admit: 2017-02-13 | Discharge: 2017-02-13 | Disposition: A | Discharge status: Home / Self Care | Payer: Medicaid Other | Source: Ambulatory Visit | Attending: Family Medicine | Admitting: Family Medicine

## 2017-02-13 DIAGNOSIS — S99922A Unspecified injury of left foot, initial encounter: Secondary | ICD-10-CM | POA: Diagnosis not present

## 2017-02-14 ENCOUNTER — Telehealth: Payer: Self-pay

## 2017-02-14 NOTE — Telephone Encounter (Signed)
-----   Message from Massie MaroonLachina M Hollis, [place]NP sent at 02/13/2017  4:21 PM EDT ----- Regarding: xray results Please inform Ms. Donavan FoilBass that xray showed no fractures or acute abnormalities. Continue treatment plan and follow up as discussed.   Thanks ----- Message ----- From: Interface, Rad Results In Sent: 02/13/2017  12:31 PM To: Massie MaroonLachina M Hollis, FNP

## 2017-02-14 NOTE — Telephone Encounter (Signed)
Called and spoke with patient advised of normal xray and to continue treatment plan as discussed during last appointment. Patient verbalized understanding. Thanks!

## 2017-02-14 NOTE — Telephone Encounter (Signed)
Called home number and mobile number no answer and no voicemail. Will try later. Thanks!

## 2017-03-04 ENCOUNTER — Ambulatory Visit: Payer: Self-pay | Admitting: Family Medicine

## 2017-03-14 ENCOUNTER — Ambulatory Visit: Payer: Self-pay | Admitting: Family Medicine

## 2017-03-15 ENCOUNTER — Ambulatory Visit: Payer: Self-pay | Admitting: Family Medicine

## 2017-03-22 ENCOUNTER — Other Ambulatory Visit: Payer: Self-pay | Admitting: Family Medicine

## 2017-03-22 ENCOUNTER — Ambulatory Visit (INDEPENDENT_AMBULATORY_CARE_PROVIDER_SITE_OTHER): Payer: Medicaid Other | Admitting: Family Medicine

## 2017-03-22 VITALS — BP 99/72 | HR 60 | Temp 98.1°F | Resp 14 | Ht 66.5 in | Wt 144.0 lb

## 2017-03-22 DIAGNOSIS — I10 Essential (primary) hypertension: Secondary | ICD-10-CM | POA: Diagnosis not present

## 2017-03-22 DIAGNOSIS — M79672 Pain in left foot: Secondary | ICD-10-CM | POA: Diagnosis not present

## 2017-03-22 DIAGNOSIS — Z1231 Encounter for screening mammogram for malignant neoplasm of breast: Secondary | ICD-10-CM

## 2017-03-22 LAB — POCT URINALYSIS DIP (DEVICE)
Bilirubin Urine: NEGATIVE
Glucose, UA: NEGATIVE mg/dL
HGB URINE DIPSTICK: NEGATIVE
KETONES UR: NEGATIVE mg/dL
Leukocytes, UA: NEGATIVE
Nitrite: NEGATIVE
PROTEIN: NEGATIVE mg/dL
SPECIFIC GRAVITY, URINE: 1.02 (ref 1.005–1.030)
UROBILINOGEN UA: 2 mg/dL — AB (ref 0.0–1.0)
pH: 6 (ref 5.0–8.0)

## 2017-03-22 MED ORDER — KETOROLAC TROMETHAMINE 60 MG/2ML IM SOLN
60.0000 mg | Freq: Once | INTRAMUSCULAR | Status: AC
  Administered 2017-03-22: 60 mg via INTRAMUSCULAR

## 2017-03-22 NOTE — Progress Notes (Signed)
Ms. Adele BarthelCarolyn Shehan, a 66 year old female presents for a follow up of left foot pain and hypertension. Greater than one month ago, Ms. Donavan FoilBass  was shopping at [employer] and sustained an injury. An employee with a buffer machine accidentally ran over her left foot. She says that she did not go to the emergency department or urgent care directly after the accident, but pain continued to increase over the past several day.    Foot Injury   The incident occurred 5 to 7 days ago. Incident location: Sustained an injury to left foot at [employer]. The injury mechanism was a direct blow. The pain is present in the left ankle. The quality of the pain is described as aching. The pain is at a severity of 8/10. The pain is moderate. The pain has been worsening since onset. Associated symptoms include an inability to bear weight. She has tried elevation, heat and ice for the symptoms. The treatment provided no relief.  Hypertension  The problem is controlled. Pertinent negatives include no anxiety, blurred vision, chest pain, headaches, neck pain, palpitations, PND, shortness of breath or sweats. Risk factors for coronary artery disease include smoking/tobacco exposure and sedentary lifestyle. The current treatment provides significant improvement. There is no history of chronic renal disease or renovascular disease.   Past Medical History:  Diagnosis Date  . Depression   . ETOH abuse   . Hepatitis C   . Hypertension   . Mental disorder   . Substance abuse    Social History   Social History  . Marital status: Divorced    Spouse name: N/A  . Number of children: N/A  . Years of education: N/A   Occupational History  . Not on file.   Social History Main Topics  . Smoking status: Current Every Day Smoker    Packs/day: 0.50    Years: 40.00    Types: Cigarettes  . Smokeless tobacco: Never Used     Comment: on nicotene patch  . Alcohol use No     Comment: quit feb 19th, 2018  . Drug use: Yes     Frequency: 7.0 times per week    Types: Cocaine, Marijuana, "Crack" cocaine     Comment: quit feb 19th, 2018  . Sexual activity: No   Other Topics Concern  . Not on file   Social History Narrative  . No narrative on file  Review of Systems  Constitutional: Negative.   HENT: Negative.   Eyes: Negative for blurred vision.  Respiratory: Negative.  Negative for shortness of breath.   Cardiovascular: Negative.  Negative for chest pain, palpitations and PND.  Musculoskeletal: Negative.  Negative for neck pain.       Left foot pain  Neurological: Negative.  Negative for headaches.  Psychiatric/Behavioral: Negative.    Physical Exam  Constitutional: She is oriented to person, place, and time.  Eyes: Pupils are equal, round, and reactive to light.  Cardiovascular: Regular rhythm, normal heart sounds and intact distal pulses.   Pulmonary/Chest: Effort normal and breath sounds normal.  Abdominal: Soft. Bowel sounds are normal.  Musculoskeletal:       Left ankle: She exhibits decreased range of motion and swelling (Swelling to anterior aspect of left foot). She exhibits normal pulse.  Neurological: She is alert and oriented to person, place, and time. Gait normal.  Skin: Skin is warm and dry.    BP 99/72 (BP Location: Left Arm, Patient Position: Sitting, Cuff Size: Normal)   Pulse 60  Temp 98.1 F (36.7 C) (Oral)   Resp 14   Ht 5' 6.5" (1.689 m)   Wt 144 lb (65.3 kg)   LMP 07/27/1997   SpO2 100%   BMI 22.89 kg/m   Plan   1. Left foot pain Reviewed left foot xray. There is no acute fracture or dislocation. Will send a referral to orthopedic services. Elevate left foot to heart level while at rest and apply cool compresses 20 minutes 4 times per day as needed.  - AMB referral to orthopedics - ketorolac (TORADOL) injection 60 mg; Inject 2 mLs (60 mg total) into the muscle once.  2. Essential hypertension, benign Blood pressure is controlled on current medication regimen.   Reviewed urinalysis, no proteinuria present.  - Basic Metabolic Panel  RTC: 3 months for hypertension  Nolon NationsLaChina Moore Abhijot Straughter  MSN, FNP-C [facility] [address]  [place], [place] [address] [phone]

## 2017-03-22 NOTE — Patient Instructions (Signed)
Left foot injury:  Received Toradol vaccination without complication Continues to have left foot pain.  Left foot xray unremarkable, will send a referral to orthopedic services for further evaluation     Foot Pain Many things can cause foot pain. Some common causes are:  An injury.  A sprain.  Arthritis.  Blisters.  Bunions.  Follow these instructions at home: Pay attention to any changes in your symptoms. Take these actions to help with your discomfort:  If directed, put ice on the affected area: ? Put ice in a plastic bag. ? Place a towel between your skin and the bag. ? Leave the ice on for 15-20 minutes, 3?4 times a day for 2 days.  Take over-the-counter and prescription medicines only as told by your health care provider.  Wear comfortable, supportive shoes that fit you well. Do not wear high heels.  Do not stand or walk for long periods of time.  Do not lift a lot of weight. This can put added pressure on your feet.  Do stretches to relieve foot pain and stiffness as told by your health care provider.  Rub your foot gently.  Keep your feet clean and dry.  Contact a health care provider if:  Your pain does not get better after a few days of self-care.  Your pain gets worse.  You cannot stand on your foot. Get help right away if:  Your foot is numb or tingling.  Your foot or toes are swollen.  Your foot or toes turn white or blue.  You have warmth and redness along your foot. This information is not intended to replace advice given to you by your health care provider. Make sure you discuss any questions you have with your health care provider. Document Released: 09/09/2015 Document Revised: 01/19/2016 Document Reviewed: 09/08/2014 Elsevier Interactive Patient Education  [employer]

## 2017-03-23 LAB — BASIC METABOLIC PANEL
BUN: 14 mg/dL (ref 7–25)
CALCIUM: 9.5 mg/dL (ref 8.6–10.4)
CHLORIDE: 106 mmol/L (ref 98–110)
CO2: 23 mmol/L (ref 20–31)
CREATININE: 0.92 mg/dL (ref 0.50–0.99)
Glucose, Bld: 75 mg/dL (ref 65–99)
Potassium: 4.4 mmol/L (ref 3.5–5.3)
Sodium: 141 mmol/L (ref 135–146)

## 2017-03-26 ENCOUNTER — Encounter: Payer: Self-pay | Admitting: Family Medicine

## 2017-04-05 ENCOUNTER — Ambulatory Visit: Payer: Self-pay

## 2017-04-09 ENCOUNTER — Ambulatory Visit (INDEPENDENT_AMBULATORY_CARE_PROVIDER_SITE_OTHER): Payer: Self-pay | Admitting: Orthopedic Surgery

## 2017-06-06 ENCOUNTER — Encounter: Payer: Self-pay | Admitting: Family Medicine

## 2017-06-06 ENCOUNTER — Ambulatory Visit (INDEPENDENT_AMBULATORY_CARE_PROVIDER_SITE_OTHER): Payer: Self-pay | Admitting: Family Medicine

## 2017-06-06 VITALS — BP 148/90 | HR 55 | Temp 97.7°F | Resp 18 | Ht 66.5 in | Wt 150.0 lb

## 2017-06-06 DIAGNOSIS — G4700 Insomnia, unspecified: Secondary | ICD-10-CM

## 2017-06-06 DIAGNOSIS — Z23 Encounter for immunization: Secondary | ICD-10-CM

## 2017-06-06 DIAGNOSIS — G629 Polyneuropathy, unspecified: Secondary | ICD-10-CM

## 2017-06-06 DIAGNOSIS — B182 Chronic viral hepatitis C: Secondary | ICD-10-CM

## 2017-06-06 DIAGNOSIS — F329 Major depressive disorder, single episode, unspecified: Secondary | ICD-10-CM

## 2017-06-06 DIAGNOSIS — F1491 Cocaine use, unspecified, in remission: Secondary | ICD-10-CM

## 2017-06-06 DIAGNOSIS — K219 Gastro-esophageal reflux disease without esophagitis: Secondary | ICD-10-CM

## 2017-06-06 DIAGNOSIS — R5383 Other fatigue: Secondary | ICD-10-CM

## 2017-06-06 DIAGNOSIS — I10 Essential (primary) hypertension: Secondary | ICD-10-CM

## 2017-06-06 DIAGNOSIS — Z87898 Personal history of other specified conditions: Secondary | ICD-10-CM

## 2017-06-06 DIAGNOSIS — F17200 Nicotine dependence, unspecified, uncomplicated: Secondary | ICD-10-CM

## 2017-06-06 LAB — POCT URINALYSIS DIP (DEVICE)
BILIRUBIN URINE: NEGATIVE
GLUCOSE, UA: NEGATIVE mg/dL
Hgb urine dipstick: NEGATIVE
KETONES UR: NEGATIVE mg/dL
NITRITE: NEGATIVE
PROTEIN: NEGATIVE mg/dL
Specific Gravity, Urine: 1.015 (ref 1.005–1.030)
Urobilinogen, UA: 4 mg/dL — ABNORMAL HIGH (ref 0.0–1.0)
pH: 7 (ref 5.0–8.0)

## 2017-06-06 MED ORDER — GABAPENTIN 300 MG PO CAPS: 300.0000 mg | ORAL_CAPSULE | Freq: Three times a day (TID) | ORAL | 5 refills | Status: DC

## 2017-06-06 MED ORDER — OMEPRAZOLE 20 MG PO CPDR: 20.0000 mg | DELAYED_RELEASE_CAPSULE | Freq: Every day | ORAL | 1 refills | Status: DC

## 2017-06-06 MED ORDER — LISINOPRIL-HYDROCHLOROTHIAZIDE 10-12.5 MG PO TABS: 1.0000 | ORAL_TABLET | Freq: Every day | ORAL | 5 refills | Status: DC

## 2017-06-06 MED ORDER — TRAZODONE HCL 50 MG PO TABS: 25.0000 mg | ORAL_TABLET | Freq: Every evening | ORAL | 3 refills | Status: DC | PRN

## 2017-06-06 NOTE — Progress Notes (Signed)
Subjective:    Patient ID: Dana Mcintosh, female    DOB: Jun 04, 1951, 66 y.o.   MRN: [id number]  HPI Dana Mcintosh, a 66 year old female with a history of hypertension, tobacco abuse and hepatitis C presents for a follow up of chronic conditions. She has a history of hypertension. She says that she has not been taking lisinopril-hydrochlorothiazide consistently. She follows a low sodium diet and remains active. She does not check blood pressures at home. She currently denies dizziness, edema, heart palpitations, chest pain, nausea, vomiting, or diarrhea.   Patient complains of periodic fatigue Symptoms began several months ago. [place] Symptoms of her fatigue have been diffuse soft tissue aches and pains, general malaise and lack of interest in usual activities. Patient denies significant change in weight, symptoms of arthritis, unusual rashes, cold intolerance, constipation and change in hair texture., GI blood loss and excessive menstrual bleeding.   She continues to complain of insomnia. She was previously taking Trazodone with maximum relief, but has been out of medication. She has difficulty falling asleep and staying asleep. She says that she has excessive daytime sleepiness. She does not have a sleep routine and typically sleeps with a television on. She gets around 2-3 hours of sleep per night.   Patient also has a history of hepatitis C. She says that she has not been followed by infectious disease. She has a history of alcohol and polysubstance abuse. She last used cocaine 3 weeks ago. She is followed by outpatient drug treatment program.     Dana Mcintosh is also requesting refills on all previous prescriptions Past Medical History:  Diagnosis Date  . Depression   . ETOH abuse   . Hepatitis C   . Hypertension   . Mental disorder   . Substance abuse [facility])    Social History   Social History  . Marital status: Divorced    Spouse name: N/A  . Number of children: N/A  . Years of  education: N/A   Occupational History  . Not on file.   Social History Main Topics  . Smoking status: Current Every Day Smoker    Packs/day: 0.50    Years: 40.00    Types: Cigarettes  . Smokeless tobacco: Never Used     Comment: on nicotene patch  . Alcohol use No     Comment: quit feb 19th, 2018  . Drug use: Yes    Frequency: 7.0 times per week    Types: Cocaine, Marijuana, "Crack" cocaine     Comment: quit feb 19th, 2018  . Sexual activity: No   Other Topics Concern  . Not on file   Social History Narrative  . No narrative on file    Immunization History  Administered Date(s) Administered  . Pneumococcal Polysaccharide-23 12/14/2016  . Tdap 12/14/2016    Review of Systems  Constitutional: Positive for fatigue.  HENT: Negative.   Eyes: Negative.   Cardiovascular: Negative for palpitations and leg swelling.  Gastrointestinal: Negative.   Endocrine: Negative.  Negative for polydipsia, polyphagia and polyuria.  Genitourinary: Negative.   Musculoskeletal: Negative.   Allergic/Immunologic: Positive for immunocompromised state (hepatitis C).  Neurological: Negative.   Psychiatric/Behavioral: Positive for sleep disturbance.       Objective:   Physical Exam  Constitutional: She is oriented to person, place, and time. She appears well-developed and well-nourished. No distress.  HENT:  Head: Normocephalic and atraumatic.  Right Ear: External ear normal.  Left Ear: External ear normal.  Nose: Nose  normal.  Mouth/Throat: Oropharynx is clear and moist.  Eyes: Pupils are equal, round, and reactive to light. Conjunctivae and EOM are normal.  Neck: Normal range of motion. Neck supple. No tracheal deviation present. No thyromegaly present.  Cardiovascular: Normal rate, normal heart sounds and intact distal pulses.  Exam reveals no gallop.   No murmur heard. Pulmonary/Chest: Effort normal and breath sounds normal.  Abdominal: Soft. Bowel sounds are normal.   Musculoskeletal: Normal range of motion.  Neurological: She is alert and oriented to person, place, and time. She has normal reflexes.  Skin: Skin is warm and dry.  Psychiatric: She has a normal mood and affect. Her behavior is normal. Judgment and thought content normal.      BP (!) 148/90 (BP Location: Left Arm, Patient Position: Sitting, Cuff Size: Normal) Comment: manually  Pulse (!) 55   Temp 97.7 F (36.5 C) (Oral)   Resp 18   Ht 5' 6.5" (1.689 m)   Wt 150 lb (68 kg)   LMP 07/27/1997   SpO2 100%   BMI 23.85 kg/m  Assessment & Plan:   1. Essential hypertension, benign Blood pressure is above goal. Patient has been out of medication.  No proteinuria present in urinalysis Will review renal functioning as results become available.  - lisinopril-hydrochlorothiazide (ZESTORETIC) 10-12.5 MG tablet; Take 1 tablet by mouth daily.  Dispense: 30 tablet; Refill: 5 - BASIC METABOLIC PANEL WITH GFR  2. Chronic hepatitis C without hepatic coma (HCC) Called infectious disease and set up appointment for 06/13/2017 with Dr. Staci Righter  3. Fatigue due to depression  - CBC with Differential - TSH  4. Neuropathy - gabapentin (NEURONTIN) 300 MG capsule; Take 1 capsule (300 mg total) by mouth 3 (three) times daily. For pain  Dispense: 90 capsule; Refill: 5  5. Gastroesophageal reflux disease without esophagitis  - omeprazole (PRILOSEC) 20 MG capsule; Take 1 capsule (20 mg total) by mouth daily.  Dispense: 30 capsule; Refill: 1  6. Insomnia, unspecified type - traZODone (DESYREL) 50 MG tablet; Take 0.5 tablets (25 mg total) by mouth at bedtime as needed for sleep.  Dispense: 30 tablet; Refill: 3  7. Tobacco use disorder Smoking cessation instruction/counseling given:  counseled patient on the dangers of tobacco use, advised patient to stop smoking, and reviewed strategies to maximize success  8. Influenza vaccination given - Flu Vaccine QUAD 6+ mos PF IM (Fluarix Quad PF)  9.  History of cocaine use Continue outpatient treatment for previous drug use. Dana Mcintosh says that it has been 3 weeks since cocaine use.     RTC: 3 months for chronic conditions     Megan Presti Rennis Petty  MSN, FNP-C Patient [facility] [address] [place], [place] [address] [phone]

## 2017-06-06 NOTE — Patient Instructions (Addendum)
[facility] [address] [place], [place] [address]   Hypertension: No medication changeswarranted - Continue medication, monitor blood pressure at home. Continue DASH diet. Reminder to go to the ER if any CP, SOB, nausea, dizziness, severe HA, changes vision/speech, left arm numbness and tingling and jaw pain.  Insomnia: Practice good sleep hygiene.  Stick to a sleep schedule, even on weekends. Exercise is great, but not too late in the day Avoid alcoholic drinks before bed Avoid large meals and beverages late before bed Don't take naps after 3 pm. Keep power naps less than 1 hour.  Relax before bed.  Take a hot bath before bed.  Have a good sleeping environment. Get rid of anything in your bedroom that might distract you from sleep.  Adopt good sleeping posture.   Continue smoking cessation.  DASH Eating Plan DASH stands for "Dietary Approaches to Stop Hypertension." The DASH eating plan is a healthy eating plan that has been shown to reduce high blood pressure (hypertension). It may also reduce your risk for type 2 diabetes, heart disease, and stroke. The DASH eating plan may also help with weight loss. What are tips for following this plan? General guidelines  Avoid eating more than 2,300 mg (milligrams) of salt (sodium) a day. If you have hypertension, you may need to reduce your sodium intake to 1,500 mg a day.  Limit alcohol intake to no more than 1 drink a day for nonpregnant women and 2 drinks a day for men. One drink equals 12 oz of beer, 5 oz of wine, or 1 oz of hard liquor.  Work with your health care provider to maintain a healthy body weight or to lose weight. Ask what an ideal weight is for you.  Get at least 30 minutes of exercise that causes your heart to beat faster (aerobic exercise) most days of the week. Activities may include walking, swimming, or biking.  Work with your health care provider or diet and nutrition specialist (dietitian)  to adjust your eating plan to your individual calorie needs. Reading food labels  Check food labels for the amount of sodium per serving. Choose foods with less than 5 percent of the Daily Value of sodium. Generally, foods with less than 300 mg of sodium per serving fit into this eating plan.  To find whole grains, look for the word "whole" as the first word in the ingredient list. Shopping  Buy products labeled as "low-sodium" or "no salt added."  Buy fresh foods. Avoid canned foods and premade or frozen meals. Cooking  Avoid adding salt when cooking. Use salt-free seasonings or herbs instead of table salt or sea salt. Check with your health care provider or pharmacist before using salt substitutes.  Do not fry foods. Cook foods using healthy methods such as baking, boiling, grilling, and broiling instead.  Cook with heart-healthy oils, such as olive, canola, soybean, or sunflower oil. Meal planning   Eat a balanced diet that includes: ? 5 or more servings of fruits and vegetables each day. At each meal, try to fill half of your plate with fruits and vegetables. ? Up to 6-8 servings of whole grains each day. ? Less than 6 oz of lean meat, poultry, or fish each day. A 3-oz serving of meat is about the same size as a deck of cards. One egg equals 1 oz. ? 2 servings of low-fat dairy each day. ? A serving of nuts, seeds, or beans 5 times each week. ? Heart-healthy  fats. Healthy fats called Omega-3 fatty acids are found in foods such as flaxseeds and coldwater fish, like sardines, salmon, and mackerel.  Limit how much you eat of the following: ? Canned or prepackaged foods. ? Food that is high in trans fat, such as fried foods. ? Food that is high in saturated fat, such as fatty meat. ? Sweets, desserts, sugary drinks, and other foods with added sugar. ? Full-fat dairy products.  Do not salt foods before eating.  Try to eat at least 2 vegetarian meals each week.  Eat more  home-cooked food and less restaurant, buffet, and fast food.  When eating at a restaurant, ask that your food be prepared with less salt or no salt, if possible. What foods are recommended? The items listed may not be a complete list. Talk with your dietitian about what dietary choices are best for you. Grains Whole-grain or whole-wheat bread. Whole-grain or whole-wheat pasta. Brown rice. Orpah Cobb. Bulgur. Whole-grain and low-sodium cereals. Pita bread. Low-fat, low-sodium crackers. Whole-wheat flour tortillas. Vegetables Fresh or frozen vegetables (raw, steamed, roasted, or grilled). Low-sodium or reduced-sodium tomato and vegetable juice. Low-sodium or reduced-sodium tomato sauce and tomato paste. Low-sodium or reduced-sodium canned vegetables. Fruits All fresh, dried, or frozen fruit. Canned fruit in natural juice (without added sugar). Meat and other protein foods Skinless chicken or [place]. Ground chicken or [place]. Pork with fat trimmed off. Fish and seafood. Egg whites. Dried beans, peas, or lentils. Unsalted nuts, nut butters, and seeds. Unsalted canned beans. Lean cuts of beef with fat trimmed off. Low-sodium, lean deli meat. Dairy Low-fat (1%) or fat-free (skim) milk. Fat-free, low-fat, or reduced-fat cheeses. Nonfat, low-sodium ricotta or cottage cheese. Low-fat or nonfat yogurt. Low-fat, low-sodium cheese. Fats and oils Soft margarine without trans fats. Vegetable oil. Low-fat, reduced-fat, or light mayonnaise and salad dressings (reduced-sodium). Canola, safflower, olive, soybean, and sunflower oils. Avocado. Seasoning and other foods Herbs. Spices. Seasoning mixes without salt. Unsalted popcorn and pretzels. Fat-free sweets. What foods are not recommended? The items listed may not be a complete list. Talk with your dietitian about what dietary choices are best for you. Grains Baked goods made with fat, such as croissants, muffins, or some breads. Dry pasta or rice meal  packs. Vegetables Creamed or fried vegetables. Vegetables in a cheese sauce. Regular canned vegetables (not low-sodium or reduced-sodium). Regular canned tomato sauce and paste (not low-sodium or reduced-sodium). Regular tomato and vegetable juice (not low-sodium or reduced-sodium). Rosita Fire. Olives. Fruits Canned fruit in a light or heavy syrup. Fried fruit. Fruit in cream or butter sauce. Meat and other protein foods Fatty cuts of meat. Ribs. Fried meat. Tomasa Blase. Sausage. Bologna and other processed lunch meats. Salami. Fatback. Hotdogs. Bratwurst. Salted nuts and seeds. Canned beans with added salt. Canned or smoked fish. Whole eggs or egg yolks. Chicken or [place] with skin. Dairy Whole or 2% milk, cream, and half-and-half. Whole or full-fat cream cheese. Whole-fat or sweetened yogurt. Full-fat cheese. Nondairy creamers. Whipped toppings. Processed cheese and cheese spreads. Fats and oils Butter. Stick margarine. Lard. Shortening. Ghee. Bacon fat. Tropical oils, such as coconut, palm kernel, or palm oil. Seasoning and other foods Salted popcorn and pretzels. Onion salt, garlic salt, seasoned salt, table salt, and sea salt. Worcestershire sauce. Tartar sauce. Barbecue sauce. Teriyaki sauce. Soy sauce, including reduced-sodium. Steak sauce. Canned and packaged gravies. Fish sauce. Oyster sauce. Cocktail sauce. Horseradish that you find on the shelf. Ketchup. Mustard. Meat flavorings and tenderizers. Bouillon cubes. Hot sauce and Tabasco sauce. Premade  or packaged marinades. Premade or packaged taco seasonings. Relishes. Regular salad dressings. Where to find more information:  National Heart, Lung, and Blood Institute: PopSteam.is  American Heart Association: www.heart.org Summary  The DASH eating plan is a healthy eating plan that has been shown to reduce high blood pressure (hypertension). It may also reduce your risk for type 2 diabetes, heart disease, and stroke.  With the DASH eating  plan, you should limit salt (sodium) intake to 2,300 mg a day. If you have hypertension, you may need to reduce your sodium intake to 1,500 mg a day.  When on the DASH eating plan, aim to eat more fresh fruits and vegetables, whole grains, lean proteins, low-fat dairy, and heart-healthy fats.  Work with your health care provider or diet and nutrition specialist (dietitian) to adjust your eating plan to your individual calorie needs. This information is not intended to replace advice given to you by your health care provider. Make sure you discuss any questions you have with your health care provider. Document Released: 08/02/2011 Document Revised: 08/06/2016 Document Reviewed: 08/06/2016 Elsevier Interactive Patient Education  2017 Elsevier Inc.  Tobacco Use Disorder Tobacco use disorder (TUD) is a mental disorder. It is the long-term use of tobacco in spite of related health problems or difficulty with normal life activities. Tobacco is most commonly smoked as cigarettes and less commonly as cigars or pipes. Smokeless chewing tobacco and snuff are also popular. People with TUD get a feeling of extreme pleasure (euphoria) from using tobacco and have a desire to use it again and again. Repeated use of tobacco can cause problems. The addictive effects of tobacco are due mainly tothe ingredient nicotine. Nicotine also causes a rush of adrenaline (epinephrine) in the body. This leads to increased blood pressure, heart rate, and breathing rate. These changes may cause problems for people with high blood pressure, weak hearts, or lung disease. High doses of nicotine in children and pets can lead to seizures and death. Tobacco contains a number of other unsafe chemicals. These chemicals are especially harmful when inhaled as smoke and can damage almost every organ in the body. Smokers live shorter lives than nonsmokers and are at risk of dying from a number of diseases and cancers. Tobacco smoke can also  cause health problems for nonsmokers (due to inhaling secondhand smoke). Smoking is also a fire hazard. TUD usually starts in the late teenage years and is most common in young adults between the ages of 13 and 25 years. People who start smoking earlier in life are more likely to continue smoking as adults. TUD is somewhat more common in men than women. People with TUD are at higher risk for using alcohol and other drugs of abuse. What increases the risk? Risk factors for TUD include:  Having family members with the disorder.  Being around people who use tobacco.  Having an existing mental health issue such as schizophrenia, depression, bipolar disorder, ADHD, or posttraumatic stress disorder (PTSD).  What are the signs or symptoms? People with tobacco use disorder have two or more of the following signs and symptoms within 12 months:  Use of more tobacco over a longer period than intended.  Not able to cut down or control tobacco use.  A lot of time spent obtaining or using tobacco.  Strong desire or urge to use tobacco (craving). Cravings may last for 6 months or longer after quitting.  Use of tobacco even when use leads to major problems at work, school, or home.  Use of tobacco even when use leads to relationship problems.  Giving up or cutting down on important life activities because of tobacco use.  Repeatedly using tobacco in situations where it puts you or others in physical danger, like smoking in bed.  Use of tobacco even when it is known that a physical or mental problem is likely related to tobacco use. ? Physical problems are numerous and may include chronic bronchitis, emphysema, lung and other cancers, gum disease, high blood pressure, heart disease, and stroke. ? Mental problems caused by tobacco may include difficulty sleeping and anxiety.  Need to use greater amounts of tobacco to get the same effect. This means you have developed a tolerance.  Withdrawal  symptoms as a result of stopping or rapidly cutting back use. These symptoms may last a month or more after quitting and include the following: ? Depressed, anxious, or irritable mood. ? Difficulty concentrating. ? Increased appetite. ? Restlessness or trouble sleeping. ? Use of tobacco to avoid withdrawal symptoms.  How is this diagnosed? Tobacco use disorder is diagnosed by your health care provider. A diagnosis may be made by:  Your health care provider asking questions about your tobacco use and any problems it may be causing.  A physical exam.  Lab tests.  You may be referred to a mental health professional or addiction specialist.  The severity of tobacco use disorder depends on the number of signs and symptoms you have:  Mild-Two or three symptoms.  Moderate-Four or five symptoms.  Severe-Six or more symptoms.  How is this treated? Many people with tobacco use disorder are unable to quit on their own and need help. Treatment options include the following:  Nicotine replacement therapy (NRT). NRT provides nicotine without the other harmful chemicals in tobacco. NRT gradually lowers the dosage of nicotine in the body and reduces withdrawal symptoms. NRT is available in over-the-counter forms (gum, lozenges, and skin patches) as well as prescription forms (mouth inhaler and nasal spray).  Medicines.This may include: ? Antidepressant medicine that may reduce nicotine cravings. ? A medicine that acts on nicotine receptors in the brain to reduce cravings and withdrawal symptoms. It may also block the effects of tobacco in people with TUD who relapse.  Counseling or talk therapy. A form of talk therapy called behavioral therapy is commonly used to treat people with TUD. Behavioral therapy looks at triggers for tobacco use, how to avoid them, and how to cope with cravings. It is most effective in person or by phone but is also available in self-help forms (books and Internet  websites).  Support groups. These provide emotional support, advice, and guidance for quitting tobacco.  The most effective treatment for TUD is usually a combination of medicine, talk therapy, and support groups. Follow these instructions at home:  Keep all follow-up visits as directed by your health care provider. This is important.  Take medicines only as directed by your health care provider.  Check with your health care provider before starting new prescription or over-the-counter medicines. Contact a health care provider if:  You are not able to take your medicines as prescribed.  Treatment is not helping your TUD and your symptoms get worse. Get help right away if:  You have serious thoughts about hurting yourself or others.  You have trouble breathing, chest pain, sudden weakness, or sudden numbness in part of your body. This information is not intended to replace advice given to you by your health care provider. Make sure you discuss  any questions you have with your health care provider. Document Released: 04/18/2004 Document Revised: 04/15/2016 Document Reviewed: 10/09/2013 Elsevier Interactive Patient Education  [employer].

## 2017-06-07 LAB — CBC WITH DIFFERENTIAL/PLATELET
BASOS ABS: 32 {cells}/uL (ref 0–200)
BASOS PCT: 0.5 %
EOS ABS: 101 {cells}/uL (ref 15–500)
Eosinophils Relative: 1.6 %
HEMATOCRIT: 36.8 % (ref 35.0–45.0)
HEMOGLOBIN: 12.9 g/dL (ref 11.7–15.5)
LYMPHS ABS: 2993 {cells}/uL (ref 850–3900)
MCH: 32.8 pg (ref 27.0–33.0)
MCHC: 35.1 g/dL (ref 32.0–36.0)
MCV: 93.6 fL (ref 80.0–100.0)
MPV: 12.1 fL (ref 7.5–12.5)
Monocytes Relative: 8.7 %
NEUTROS ABS: 2627 {cells}/uL (ref 1500–7800)
Neutrophils Relative %: 41.7 %
Platelets: 128 10*3/uL — ABNORMAL LOW (ref 140–400)
RBC: 3.93 10*6/uL (ref 3.80–5.10)
RDW: 13.9 % (ref 11.0–15.0)
Total Lymphocyte: 47.5 %
WBC: 6.3 10*3/uL (ref 3.8–10.8)
WBCMIX: 548 {cells}/uL (ref 200–950)

## 2017-06-07 LAB — BASIC METABOLIC PANEL WITH GFR
BUN: 14 mg/dL (ref 7–25)
CO2: 29 mmol/L (ref 20–32)
CREATININE: 0.9 mg/dL (ref 0.50–0.99)
Calcium: 9.6 mg/dL (ref 8.6–10.4)
Chloride: 103 mmol/L (ref 98–110)
GFR, EST AFRICAN AMERICAN: 78 mL/min/{1.73_m2} (ref >=60)
GFR, EST NON AFRICAN AMERICAN: 68 mL/min/{1.73_m2} (ref >=60)
GLUCOSE: 88 mg/dL (ref 65–99)
Potassium: 3.5 mmol/L (ref 3.5–5.3)
Sodium: 139 mmol/L (ref 135–146)

## 2017-06-07 LAB — TSH: TSH: 2.8 mIU/L (ref 0.40–4.50)

## 2017-06-13 ENCOUNTER — Encounter: Payer: Self-pay | Admitting: Internal Medicine

## 2017-06-13 ENCOUNTER — Ambulatory Visit (INDEPENDENT_AMBULATORY_CARE_PROVIDER_SITE_OTHER): Payer: Medicaid Other | Admitting: Internal Medicine

## 2017-06-13 VITALS — BP 167/90 | HR 67 | Temp 99.0°F | Wt 145.0 lb

## 2017-06-13 DIAGNOSIS — K7460 Unspecified cirrhosis of liver: Secondary | ICD-10-CM

## 2017-06-13 DIAGNOSIS — B182 Chronic viral hepatitis C: Secondary | ICD-10-CM | POA: Diagnosis not present

## 2017-06-13 DIAGNOSIS — F1021 Alcohol dependence, in remission: Secondary | ICD-10-CM

## 2017-06-13 MED ORDER — RIBAVIRIN 200 MG PO TABS: 600.0000 mg | ORAL_TABLET | Freq: Every day | ORAL | 2 refills | Status: DC

## 2017-06-13 MED ORDER — LEDIPASVIR-SOFOSBUVIR 90-400 MG PO TABS: 1.0000 | ORAL_TABLET | Freq: Every day | ORAL | 2 refills | Status: DC

## 2017-06-13 NOTE — Assessment & Plan Note (Signed)
She signed the readiness form.  I will get her treated with Harvoni and ribavirin.

## 2017-06-13 NOTE — Assessment & Plan Note (Signed)
Now in a treatment program.

## 2017-06-13 NOTE — Progress Notes (Signed)
   Subjective:    Patient ID: Dana Mcintosh, female    DOB: 10/03/1950, 66 y.o.   MRN: [id number]  HPI She is here for hepatitis C.   I saw her earlier this year and she had alcohol abuse and I discussed with her the options of treatment need to include alcohol rehab. She did not follow up until now but has been in a drug and alcohol program and doing well now.  She is pleased with her progress and ready for treatment.  Also followed by primary care.  No history if IVDU. Not taking omeprazole.   Review of Systems  Constitutional: Positive for fatigue.  Gastrointestinal: Negative for diarrhea.  Skin: Negative for rash.       Objective:   Physical Exam  Constitutional: She appears well-developed and well-nourished. No distress.  HENT:  Mouth/Throat: No oropharyngeal exudate.  Eyes: No scleral icterus.  Cardiovascular: Normal rate, regular rhythm and normal heart sounds.   No murmur heard. Pulmonary/Chest: Effort normal and breath sounds normal. No respiratory distress.  Skin: No rash noted.   SH: no alcohol for 2 weeks, no recent drug use       Assessment & Plan:

## 2017-06-13 NOTE — Patient Instructions (Signed)
Date 06/13/17  Dear Ms Donavan FoilBass, As discussed in the ID Clinic, your hepatitis C therapy will include the following medications:    sofosbuvir 400 mg/velpatasvir 100 mg (Epclusa) oral daily  Please note that ALL MEDICATIONS WILL START ON THE SAME DATE for a total of 12 weeks. ---------------------------------------------------------------- Your HCV Treatment Start Date: TBA   Your HCV genotype: unknown    Liver Fibrosis: TBD    ---------------------------------------------------------------- YOUR PHARMACY CONTACT (depending on your insurance):   [facility] [address] [place], [place] [address] Phone: [phone] Hours: Monday to Friday 7:30 am to 6:00 pm   Please always contact your pharmacy at least 3-4 business days before you run out of medications to ensure your next month's medication is ready or 1 week prior to running out if you receive it by mail.  Remember, each prescription is for 28 days. ---------------------------------------------------------------- GENERAL NOTES REGARDING YOUR HEPATITIS C MEDICATION:  SOFOSBUVIR (SOVALDI or EPCLUSA): - Sofosbuvir 400 mg tablet is taken daily with OR without food. - The sofosbuvir tablets are yellow. - The Epclusa tablets are pink, diamond-shaped - The tablets should be stored at room temperature. - The most common side effects with sofosbuvir or Epclusa include:      1. Fatigue      2. Headache      3. Nausea      4. Diarrhea      5. Insomnia  - Acid reducing agents such as H2 blockers (ie. Pepcid (famotidine), Zantac (ranitidine), Tagamet (cimetidine), Axid (nizatidine) and proton pump inhibitors (ie. Prilosec (omeprazole), Protonix (pantoprazole), Nexium (esomeprazole), or Aciphex (rabeprazole)) can decrease effectiveness of Harvoni. Do not take until you have discussed with a health care provider.    -Antacids that contain magnesium and/or aluminum hydroxide (ie. Milk of Magensia, Rolaids, Gaviscon,  Maalox, Mylanta, an dArthritis Pain Formula)can reduce absorption of sofosbuvir, so take them at least 4 hours before or after Harvoni.  -Calcium carbonate (calcium supplements or antacids such as Tums, Caltrate, Os-Cal)needs to be taken at least 4 hours hours before or after sofosbuvir.  -St. John's wort or any products that contain St. John's wort like some herbal supplements  Please inform the office prior to starting any of these medications.   Please note that this only lists the most common side effects and is NOT a comprehensive list of the potential side effects of these medications. For more information, please review the drug information sheets that come with your medication package from the pharmacy.  ---------------------------------------------------------------- GENERAL HELPFUL HINTS ON HCV THERAPY: 1. No alcohol. 2. Stay well-hydrated 3. Notify the ID Clinic of any changes in your other over-the-counter/herbal or prescription medications. 4. If you miss a dose of your medication, take the missed dose as soon as you remember. Return to your regular time/dose schedule the next day.  5.  Do not stop taking your medications without first talking with your healthcare provider. 6.  You may take Tylenol (acetaminophen), as long as the dose is less than 2000 mg (OR no more than 4 tablets of the Tylenol Extra Strengths 500mg  tablet) in 24 hours. 7. You will follow up with our clinic pharmacist initially after starting the medication to monitor for any possible side effects 8. You will get labs once during treatment, soon after treatment completion and again 6 months or more after treatment completion to verify the virus is completely gone.   Staci RighterOMER, Erminia Mcnew, MD  [facility] [address]  [place], [place]  [address] [phone]

## 2017-06-13 NOTE — Assessment & Plan Note (Signed)
[facility].  She missed her previous ultrasound appt.

## 2017-06-14 ENCOUNTER — Telehealth: Payer: Self-pay

## 2017-06-14 LAB — CBC WITH DIFFERENTIAL/PLATELET
Basophils Absolute: 39 cells/uL (ref 0–200)
Basophils Relative: 0.5 %
EOS PCT: 1.4 %
Eosinophils Absolute: 108 cells/uL (ref 15–500)
HCT: 37.7 % (ref 35.0–45.0)
Hemoglobin: 13 g/dL (ref 11.7–15.5)
Lymphs Abs: 4150 cells/uL — ABNORMAL HIGH (ref 850–3900)
MCH: 32.4 pg (ref 27.0–33.0)
MCHC: 34.5 g/dL (ref 32.0–36.0)
MCV: 94 fL (ref 80.0–100.0)
MONOS PCT: 6.2 %
MPV: 12.2 fL (ref 7.5–12.5)
NEUTROS PCT: 38 %
Neutro Abs: 2926 cells/uL (ref 1500–7800)
Platelets: 138 10*3/uL — ABNORMAL LOW (ref 140–400)
RBC: 4.01 10*6/uL (ref 3.80–5.10)
RDW: 13.7 % (ref 11.0–15.0)
TOTAL LYMPHOCYTE: 53.9 %
WBC mixed population: 477 cells/uL (ref 200–950)
WBC: 7.7 10*3/uL (ref 3.8–10.8)

## 2017-06-14 LAB — COMPLETE METABOLIC PANEL WITH GFR
AG Ratio: 0.8 (calc) — ABNORMAL LOW (ref 1.0–2.5)
ALBUMIN MSPROF: 3.5 g/dL — AB (ref 3.6–5.1)
ALKALINE PHOSPHATASE (APISO): 68 U/L (ref 33–130)
ALT: 70 U/L — ABNORMAL HIGH (ref 6–29)
AST: 95 U/L — ABNORMAL HIGH (ref 10–35)
BILIRUBIN TOTAL: 0.8 mg/dL (ref 0.2–1.2)
BUN: 11 mg/dL (ref 7–25)
CHLORIDE: 105 mmol/L (ref 98–110)
CO2: 26 mmol/L (ref 20–32)
Calcium: 9.2 mg/dL (ref 8.6–10.4)
Creat: 0.92 mg/dL (ref 0.50–0.99)
GFR, Est African American: 76 mL/min/{1.73_m2} (ref >=60)
GFR, Est Non African American: 66 mL/min/{1.73_m2} (ref >=60)
GLOBULIN: 4.5 g/dL — AB (ref 1.9–3.7)
GLUCOSE: 111 mg/dL — AB (ref 65–99)
Potassium: 4.3 mmol/L (ref 3.5–5.3)
SODIUM: 139 mmol/L (ref 135–146)
Total Protein: 8 g/dL (ref 6.1–8.1)

## 2017-06-14 LAB — PROTIME-INR
INR: 1.1
Prothrombin Time: 11.2 s (ref 9.0–11.5)

## 2017-06-14 LAB — HEPATITIS B SURFACE ANTIGEN: HEP B S AG: NONREACTIVE

## 2017-06-14 LAB — HIV ANTIBODY (ROUTINE TESTING W REFLEX): HIV 1&2 Ab, 4th Generation: NONREACTIVE

## 2017-06-14 NOTE — Telephone Encounter (Signed)
Voicemail : Pharmacy calling regarding Harvoni.  Harvoni need pre authorization for medicaid.    Please call for prior authorization.   [facility]  [phone]   Tomasita Morrowammy King, RN

## 2017-06-17 LAB — HEPATITIS C RNA QUANTITATIVE
HCV QUANT LOG: 6.76 {Log_IU}/mL — AB
HCV RNA, PCR, QN: 5790000 [IU]/mL — AB

## 2017-06-17 LAB — HEPATITIS C GENOTYPE

## 2017-06-17 NOTE — Telephone Encounter (Signed)
Kathie RhodesBetty please take a look at this.

## 2017-06-18 ENCOUNTER — Other Ambulatory Visit: Payer: Self-pay | Admitting: Pharmacist Clinician (PhC)/ Clinical Pharmacy Specialist

## 2017-06-18 MED ORDER — SOFOSBUVIR-VELPATASVIR 400-100 MG PO TABS: 1.0000 | ORAL_TABLET | Freq: Every day | ORAL | 2 refills | Status: DC

## 2017-06-18 NOTE — Telephone Encounter (Signed)
I will call [facility] and explain that this specialty medication will be filled elsewhere.

## 2017-06-19 ENCOUNTER — Other Ambulatory Visit: Payer: Self-pay | Admitting: Pharmacist

## 2017-06-19 MED FILL — RIBAVIRIN 200 MG TABLET: 200 | 30 days supply | Qty: 90 | Fill #0

## 2017-06-20 ENCOUNTER — Encounter: Payer: Self-pay | Admitting: Pharmacy Technician

## 2017-06-20 MED FILL — EPCLUSA 400 MG-100 MG TAB: 400-100 | 28 days supply | Qty: 28 | Fill #0

## 2017-06-24 ENCOUNTER — Ambulatory Visit: Payer: Self-pay | Admitting: Family Medicine

## 2017-06-26 ENCOUNTER — Telehealth: Payer: Self-pay | Admitting: Pharmacist

## 2017-06-26 NOTE — Telephone Encounter (Signed)
Patient called asking if any of her medications interact with her Epclusa.  No interactions noted except for omeprazole which she states she is not taking and will not take until she is done with Epclusa and ribavirin. Told her to take all 3 tablets of ribavirin and 1 tablet of Ecplusa at the same time every day. She will take it at night. She will start tomorrow 11/1.

## 2017-07-10 ENCOUNTER — Ambulatory Visit: Payer: Self-pay

## 2017-07-15 ENCOUNTER — Ambulatory Visit: Payer: Self-pay

## 2017-07-22 ENCOUNTER — Other Ambulatory Visit: Payer: Self-pay | Admitting: Family Medicine

## 2017-07-22 DIAGNOSIS — K219 Gastro-esophageal reflux disease without esophagitis: Secondary | ICD-10-CM

## 2017-07-23 ENCOUNTER — Telehealth: Payer: Self-pay | Admitting: Pharmacist

## 2017-07-23 ENCOUNTER — Other Ambulatory Visit: Payer: Self-pay | Admitting: Pharmacist

## 2017-07-23 MED FILL — RIBAVIRIN 200 MG TABLET: 200 | 30 days supply | Qty: 90 | Fill #1

## 2017-07-23 MED FILL — EPCLUSA 400 MG-100 MG TAB: 400-100 | 28 days supply | Qty: 28 | Fill #1

## 2017-07-23 NOTE — Telephone Encounter (Signed)
Dana Mcintosh and rescheduled her Hep C follow-up appointment for tomorrow 11/28 at 3pm.

## 2017-07-24 ENCOUNTER — Ambulatory Visit (INDEPENDENT_AMBULATORY_CARE_PROVIDER_SITE_OTHER): Payer: Medicaid Other | Admitting: Pharmacist

## 2017-07-24 DIAGNOSIS — B182 Chronic viral hepatitis C: Secondary | ICD-10-CM | POA: Diagnosis not present

## 2017-07-24 NOTE — Progress Notes (Signed)
HPI: Dana Mcintosh is a 66 y.o. female who presents to the pharmacy clinic today for a follow-up on her Hep C disease. She has genotype 1a disease with decompensated cirrhosis.   Lab Results  Component Value Date   HCVGENOTYPE 1a 06/13/2017    Allergies: Allergies  Allergen Reactions  . Penicillins Hives    Vitals:    Past Medical History: Past Medical History:  Diagnosis Date  . Depression   . ETOH abuse   . Hepatitis C   . Hypertension   . Mental disorder   . Substance abuse (HCC)     Social History: Social History   Socioeconomic History  . Marital status: Divorced    Spouse name: Not on file  . Number of children: Not on file  . Years of education: Not on file  . Highest education level: Not on file  Social Needs  . Financial resource strain: Not on file  . Food insecurity - worry: Not on file  . Food insecurity - inability: Not on file  . Transportation needs - medical: Not on file  . Transportation needs - non-medical: Not on file  Occupational History  . Not on file  Tobacco Use  . Smoking status: Current Every Day Smoker    Packs/day: 0.50    Years: 40.00    Pack years: 20.00    Types: Cigarettes  . Smokeless tobacco: Never Used  . Tobacco comment: on nicotene patch  Substance and Sexual Activity  . Alcohol use: No    Alcohol/week: 26.4 oz    Types: 24 Cans of beer, 20 Shots of liquor per week    Comment: quit feb 19th, 2018  . Drug use: No    Comment: quit feb 19th, 2018  . Sexual activity: No  Other Topics Concern  . Not on file  Social History Narrative  . Not on file    Labs: Hep B S Ab (no units)  Date Value  01/07/2017 NEG   Hepatitis B Surface Ag (no units)  Date Value  06/13/2017 NON-REACTIVE   HCV Ab (no units)  Date Value  04/02/2014 Reactive (A)    Lab Results  Component Value Date   HCVGENOTYPE 1a 06/13/2017    Hepatitis C RNA quantitative Latest Ref Rng & Units 06/13/2017 01/07/2017  HCV Quantitative NOT  DETECTED IU/mL - 6,050,000(H)  HCV Quantitative Log NOT DETECT Log IU/mL 6.76(H) 6.78(H)    AST (U/L)  Date Value  06/13/2017 95 (H)  12/07/2016 182 (H)  05/26/2016 129 (H)   ALT (U/L)  Date Value  06/13/2017 70 (H)  12/07/2016 157 (H)  05/26/2016 104 (H)   INR (no units)  Date Value  06/13/2017 1.1  05/26/2016 1.21  12/15/2015 1.11    CrCl: CrCl cannot be calculated (Patient's most recent lab result is older than the maximum 21 days allowed.).  Fibrosis Score: F4     Child-Pugh Score: B   Assessment: Dana Mcintosh presents to the pharmacy clinic today for her 1 month follow-up for Epclusa/Ribavirin. She has been doing well on this regimen. She started both medications on 06/27/17. She has about 4-5 days left of both medications and brought them with her. She notes that she missed 2 doses this month because she was visiting her son and forgot to take them. We counseled her on the importance of taking the medication and provided her with a medication keychain. We will make sure that she gets her refills.   She has not noted any  side effects from the medications but does note some shoulder pain. We referred her to her PCP for this problem.   We will re-collect her viral load, CBC, and CMET today. We were going to administer her first Hep B vaccine but the clinic ran out so she will pop in tomorrow to get the vaccine.   Recommendations: - HCV viral load, CMET, and CBC today - Continue taking Epclusa and Ribavirin every day - Come back tomorrow for Hep B # 1  - F/U with pharmacy on 08/29/2017    Della GooEmily S Sinclair, Pharm.D., BCPS PGY2 Infectious Diseases Pharmacy Resident  [facility] 07/24/2017, 3:02 PM

## 2017-07-25 LAB — CBC
HCT: 30.2 % — ABNORMAL LOW (ref 35.0–45.0)
Hemoglobin: 10.4 g/dL — ABNORMAL LOW (ref 11.7–15.5)
MCH: 33.1 pg — AB (ref 27.0–33.0)
MCHC: 34.4 g/dL (ref 32.0–36.0)
MCV: 96.2 fL (ref 80.0–100.0)
MPV: 11.2 fL (ref 7.5–12.5)
Platelets: 141 10*3/uL (ref 140–400)
RBC: 3.14 10*6/uL — ABNORMAL LOW (ref 3.80–5.10)
RDW: 13.2 % (ref 11.0–15.0)
WBC: 6.5 10*3/uL (ref 3.8–10.8)

## 2017-07-25 LAB — COMPREHENSIVE METABOLIC PANEL
AG RATIO: 0.8 (calc) — AB (ref 1.0–2.5)
ALBUMIN MSPROF: 3.5 g/dL — AB (ref 3.6–5.1)
ALKALINE PHOSPHATASE (APISO): 59 U/L (ref 33–130)
ALT: 8 U/L (ref 6–29)
AST: 21 U/L (ref 10–35)
BILIRUBIN TOTAL: 0.9 mg/dL (ref 0.2–1.2)
BUN: 11 mg/dL (ref 7–25)
CALCIUM: 9.1 mg/dL (ref 8.6–10.4)
CHLORIDE: 108 mmol/L (ref 98–110)
CO2: 25 mmol/L (ref 20–32)
Creat: 0.77 mg/dL (ref 0.50–0.99)
GLOBULIN: 4.3 g/dL — AB (ref 1.9–3.7)
Glucose, Bld: 102 mg/dL — ABNORMAL HIGH (ref 65–99)
POTASSIUM: 4.3 mmol/L (ref 3.5–5.3)
SODIUM: 139 mmol/L (ref 135–146)
TOTAL PROTEIN: 7.8 g/dL (ref 6.1–8.1)

## 2017-07-26 LAB — HEPATITIS C RNA QUANTITATIVE
HCV QUANT LOG: NOT DETECTED {Log_IU}/mL
HCV RNA, PCR, QN: <15 IU/mL

## 2017-08-12 ENCOUNTER — Other Ambulatory Visit: Payer: Self-pay | Admitting: Pharmacist

## 2017-08-28 ENCOUNTER — Other Ambulatory Visit: Payer: Self-pay | Admitting: Pharmacist

## 2017-08-28 MED FILL — EPCLUSA 400 MG-100 MG TAB: 400-100 | 28 days supply | Qty: 28 | Fill #2

## 2017-08-28 MED FILL — RIBAVIRIN 200 MG TABLET: 200 | 30 days supply | Qty: 90 | Fill #2

## 2017-08-29 ENCOUNTER — Ambulatory Visit: Payer: Self-pay

## 2017-09-06 ENCOUNTER — Ambulatory Visit: Payer: Self-pay | Admitting: Family Medicine

## 2017-09-13 ENCOUNTER — Ambulatory Visit: Payer: Self-pay | Admitting: Family Medicine

## 2017-09-17 ENCOUNTER — Ambulatory Visit (INDEPENDENT_AMBULATORY_CARE_PROVIDER_SITE_OTHER): Payer: Medicaid Other | Admitting: Pharmacist Clinician (PhC)/ Clinical Pharmacy Specialist

## 2017-09-17 DIAGNOSIS — Z23 Encounter for immunization: Secondary | ICD-10-CM | POA: Diagnosis not present

## 2017-09-17 DIAGNOSIS — B182 Chronic viral hepatitis C: Secondary | ICD-10-CM

## 2017-09-17 LAB — COMPREHENSIVE METABOLIC PANEL
AG RATIO: 0.9 (calc) — AB (ref 1.0–2.5)
ALBUMIN MSPROF: 3.9 g/dL (ref 3.6–5.1)
ALKALINE PHOSPHATASE (APISO): 63 U/L (ref 33–130)
ALT: 8 U/L (ref 6–29)
AST: 19 U/L (ref 10–35)
BILIRUBIN TOTAL: 0.7 mg/dL (ref 0.2–1.2)
BUN: 17 mg/dL (ref 7–25)
CO2: 27 mmol/L (ref 20–32)
Calcium: 9.9 mg/dL (ref 8.6–10.4)
Chloride: 105 mmol/L (ref 98–110)
Creat: 0.97 mg/dL (ref 0.50–0.99)
GLOBULIN: 4.2 g/dL — AB (ref 1.9–3.7)
Glucose, Bld: 98 mg/dL (ref 65–99)
POTASSIUM: 4.5 mmol/L (ref 3.5–5.3)
Sodium: 139 mmol/L (ref 135–146)
Total Protein: 8.1 g/dL (ref 6.1–8.1)

## 2017-09-17 LAB — CBC
HCT: 34.9 % — ABNORMAL LOW (ref 35.0–45.0)
Hemoglobin: 11.8 g/dL (ref 11.7–15.5)
MCH: 33.1 pg — ABNORMAL HIGH (ref 27.0–33.0)
MCHC: 33.8 g/dL (ref 32.0–36.0)
MCV: 97.8 fL (ref 80.0–100.0)
MPV: 11.5 fL (ref 7.5–12.5)
PLATELETS: 143 10*3/uL (ref 140–400)
RBC: 3.57 10*6/uL — AB (ref 3.80–5.10)
RDW: 12.1 % (ref 11.0–15.0)
WBC: 5.4 10*3/uL (ref 3.8–10.8)

## 2017-09-17 NOTE — Progress Notes (Addendum)
HPI: Dana Mcintosh is a 67 y.o. female who presents to the pharmacy clinic today for her Hepatitis C follow-up. She has genotype 1a disease with decompensated cirrhosis.   Lab Results  Component Value Date   HCVGENOTYPE 1a 06/13/2017    Allergies: Allergies  Allergen Reactions  . Penicillins Hives    Vitals:    Past Medical History: Past Medical History:  Diagnosis Date  . Depression   . ETOH abuse   . Hepatitis C   . Hypertension   . Mental disorder   . Substance abuse (HCC)     Social History: Social History   Socioeconomic History  . Marital status: Divorced    Spouse name: Not on file  . Number of children: Not on file  . Years of education: Not on file  . Highest education level: Not on file  Social Needs  . Financial resource strain: Not on file  . Food insecurity - worry: Not on file  . Food insecurity - inability: Not on file  . Transportation needs - medical: Not on file  . Transportation needs - non-medical: Not on file  Occupational History  . Not on file  Tobacco Use  . Smoking status: Current Every Day Smoker    Packs/day: 0.50    Years: 40.00    Pack years: 20.00    Types: Cigarettes  . Smokeless tobacco: Never Used  . Tobacco comment: on nicotene patch  Substance and Sexual Activity  . Alcohol use: No    Alcohol/week: 26.4 oz    Types: 24 Cans of beer, 20 Shots of liquor per week    Comment: quit feb 19th, 2018  . Drug use: No    Comment: quit feb 19th, 2018  . Sexual activity: No  Other Topics Concern  . Not on file  Social History Narrative  . Not on file    Labs: Hep B S Ab (no units)  Date Value  01/07/2017 NEG   Hepatitis B Surface Ag (no units)  Date Value  06/13/2017 NON-REACTIVE   HCV Ab (no units)  Date Value  04/02/2014 Reactive (A)    Lab Results  Component Value Date   HCVGENOTYPE 1a 06/13/2017    Hepatitis C RNA quantitative Latest Ref Rng & Units 07/24/2017 06/13/2017 01/07/2017  HCV Quantitative NOT  DETECTED IU/mL - - 6,050,000(H)  HCV Quantitative Log NOT DETECT Log IU/mL <1.18 NOT DETECTED 6.76(H) 6.78(H)    AST (U/L)  Date Value  07/24/2017 21  06/13/2017 95 (H)  12/07/2016 182 (H)   ALT (U/L)  Date Value  07/24/2017 8  06/13/2017 70 (H)  12/07/2016 157 (H)   INR (no units)  Date Value  06/13/2017 1.1  05/26/2016 1.21  12/15/2015 1.11    CrCl: CrCl cannot be calculated (Patient's most recent lab result is older than the maximum 21 days allowed.).  Fibrosis Score: F4  Child-Pugh Score: B   Assessment: Dana Mcintosh is here today to follow-up on her Hepatitis C disease. She is currently on her 3rd month of Epclusa + Ribavirin. She states that has only missed 3 doses during her entire course and only has a few days of therapy left. This adds up as she should almost be done with therapy since she started at the beginning of November.   Dana Mcintosh is still in her drug rehab classes and is doing well. She does report that she has some chest tightness and tingling down her arm that has been going on for about 1-2  weeks. She also reports occasional dizziness. We will re-check her labs today including HCV viral load, CMET and CBC since she is on ribavirin. We will also administer Hep B #1.   We referred her to Urgent Care for her chest tightness and arm tingling. She was a smoker, currently on patches, so she is at a higher risk of heart disease.      Recommendations: -HCV viral load, CMET and CBC today - Hepatitis B # 1 today - Continue taking Epclusa/Ribavirin daily until this fill is complete  - F/U for labs on 4/29 - F/U with Dr. Luciana Axe for [facility] on 12/31/17 - Go to Urgent Care after this visit for evaluation of chest tightness  Sharin Mons, PharmD, BCPS PGY2 Infectious Diseases Pharmacy Resident Pager: [phone]  09/17/2017, 4:08 PM   Agreed with Emily's note. Her chest symptoms resembled more GERD like. Hopefully, she will see urgent care so they can eval her. She  will f/u with Dr. Luciana Axe after her SVR12.  Ulyses Southward, PharmD, BCPS, AAHIVP, CPP Infectious Disease Pharmacist Pager: [phone] 09/18/2017 8:41 AM

## 2017-09-19 LAB — HEPATITIS C RNA QUANTITATIVE
HCV QUANT LOG: NOT DETECTED {Log_IU}/mL
HCV RNA, PCR, QN: <15 IU/mL

## 2017-09-26 ENCOUNTER — Ambulatory Visit (INDEPENDENT_AMBULATORY_CARE_PROVIDER_SITE_OTHER): Payer: Medicaid Other | Admitting: Family Medicine

## 2017-09-26 ENCOUNTER — Encounter: Payer: Self-pay | Admitting: Family Medicine

## 2017-09-26 VITALS — BP 121/69 | HR 62 | Temp 98.2°F | Resp 16 | Ht 66.5 in | Wt 159.0 lb

## 2017-09-26 DIAGNOSIS — R8290 Unspecified abnormal findings in urine: Secondary | ICD-10-CM | POA: Diagnosis not present

## 2017-09-26 DIAGNOSIS — G8929 Other chronic pain: Secondary | ICD-10-CM

## 2017-09-26 DIAGNOSIS — R635 Abnormal weight gain: Secondary | ICD-10-CM

## 2017-09-26 DIAGNOSIS — J309 Allergic rhinitis, unspecified: Secondary | ICD-10-CM

## 2017-09-26 DIAGNOSIS — I10 Essential (primary) hypertension: Secondary | ICD-10-CM | POA: Diagnosis not present

## 2017-09-26 DIAGNOSIS — M25511 Pain in right shoulder: Secondary | ICD-10-CM

## 2017-09-26 DIAGNOSIS — R911 Solitary pulmonary nodule: Secondary | ICD-10-CM

## 2017-09-26 DIAGNOSIS — R102 Pelvic and perineal pain unspecified side: Secondary | ICD-10-CM

## 2017-09-26 DIAGNOSIS — F17200 Nicotine dependence, unspecified, uncomplicated: Secondary | ICD-10-CM | POA: Diagnosis not present

## 2017-09-26 DIAGNOSIS — R42 Dizziness and giddiness: Secondary | ICD-10-CM

## 2017-09-26 LAB — POCT URINALYSIS DIP (DEVICE)
Bilirubin Urine: NEGATIVE
Glucose, UA: NEGATIVE mg/dL
HGB URINE DIPSTICK: NEGATIVE
Ketones, ur: NEGATIVE mg/dL
Nitrite: NEGATIVE
Protein, ur: NEGATIVE mg/dL
SPECIFIC GRAVITY, URINE: 1.01 (ref 1.005–1.030)
Urobilinogen, UA: 1 mg/dL (ref 0.0–1.0)
pH: 6.5 (ref 5.0–8.0)

## 2017-09-26 MED ORDER — CETIRIZINE HCL 10 MG PO TABS: 10.0000 mg | ORAL_TABLET | Freq: Every day | ORAL | 11 refills | Status: DC

## 2017-09-26 NOTE — Progress Notes (Signed)
urin

## 2017-09-26 NOTE — Progress Notes (Signed)
Subjective:    Patient ID: Dana Mcintosh, female    DOB: [birth year], 67 y.o.   MRN: [id number]  HPI [name], a 67 year old female with a history of hypertension, tobacco abuse and hepatitis C presents for a follow up of chronic conditions. She has a history of hypertension. She says that she been taking lisinopril-hydrochlorothiazide consistently. She follows a low sodium diet and remains active. She does not check blood pressures at home. She currently denies dizziness, edema, heart palpitations, chest pain, nausea, vomiting, or diarrhea. Cardiac risk factors include sedentary lifestyle and everyday tobacco user.  Patient complaints of left shoulder pain. Patient denies personal injury. Pain is characterized as intermittent and throbbing. Patient states that it is difficult to reach over head and pick up items. Pain has been present over the past several months. Current pain intensity is 6/10. She has be using OTC analgesics without sustained relief.  The pain occurs when active.      Past Medical History:  Diagnosis Date  . Depression   . ETOH abuse   . Hepatitis C   . Hypertension   . Mental disorder   . Substance abuse (HCC)    Social History   Socioeconomic History  . Marital status: Divorced    Spouse name: Not on file  . Number of children: Not on file  . Years of education: Not on file  . Highest education level: Not on file  Social Needs  . Financial resource strain: Not on file  . Food insecurity - worry: Not on file  . Food insecurity - inability: Not on file  . Transportation needs - medical: Not on file  . Transportation needs - non-medical: Not on file  Occupational History  . Not on file  Tobacco Use  . Smoking status: Current Every Day Smoker    Packs/day: 0.50    Years: 40.00    Pack years: 20.00    Types: Cigarettes  . Smokeless tobacco: Never Used  . Tobacco comment: on nicotene patch  Substance and Sexual Activity  . Alcohol use: No   Alcohol/week: 26.4 oz    Types: 24 Cans of beer, 20 Shots of liquor per week    Comment: quit feb 19th, 2018  . Drug use: No    Comment: quit feb 19th, 2018  . Sexual activity: No  Other Topics Concern  . Not on file  Social History Narrative  . Not on file    Immunization History  Administered Date(s) Administered  . Hepatitis B, adult 09/17/2017  . Influenza,inj,Quad PF,6+ Mos 06/06/2017  . Pneumococcal Polysaccharide-23 12/14/2016  . Tdap 12/14/2016    Review of Systems  HENT: Negative.   Eyes: Negative.   Cardiovascular: Negative for palpitations and leg swelling.  Gastrointestinal: Negative.  Negative for constipation.  Endocrine: Negative.  Negative for cold intolerance, heat intolerance, polydipsia, polyphagia and polyuria.  Genitourinary: Negative.   Musculoskeletal: Positive for arthralgias (right shoulder).  Allergic/Immunologic: Positive for immunocompromised state (hepatitis C).  Neurological: Negative.   Psychiatric/Behavioral: Positive for sleep disturbance.       Objective:   Physical Exam  Constitutional: She is oriented to person, place, and time. She appears well-developed and well-nourished. No distress.  HENT:  Head: Normocephalic and atraumatic.  Right Ear: External ear normal.  Left Ear: External ear normal.  Nose: Nose normal.  Mouth/Throat: Oropharynx is clear and moist.  Eyes: Conjunctivae and EOM are normal. Pupils are equal, round, and reactive to light.  Neck:  Normal range of motion. Neck supple. No tracheal deviation present. No thyromegaly present.  Cardiovascular: Normal rate, normal heart sounds and intact distal pulses. Exam reveals no gallop.  No murmur heard. Pulmonary/Chest: Effort normal and breath sounds normal.  Abdominal: Soft. Bowel sounds are normal.  Musculoskeletal:       Right shoulder: She exhibits decreased range of motion, tenderness, pain and decreased strength. She exhibits no swelling.  Neurological: She is alert  and oriented to person, place, and time. She has normal reflexes.  Skin: Skin is warm and dry.  Psychiatric: She has a normal mood and affect. Her behavior is normal. Judgment and thought content normal.      BP 121/69 (BP Location: Right Arm, Patient Position: Sitting, Cuff Size: Normal)   Pulse 62   Temp 98.2 F (36.8 C) (Oral)   Resp 16   Ht 5' 6.5" (1.689 m)   Wt 159 lb (72.1 kg)   LMP 07/27/1997   SpO2 100%   BMI 25.28 kg/m  Assessment & Plan:  1. Essential hypertension, benign Blood pressure at goal on current medication regimen. No changes warranted at this time.  Reviewed previous labs, renal functioning within a normal range - POCT urinalysis dip (device)  2. Abnormal urinalysis Urine leukocytes, will follow culture to r/o bacterial presene - Urine Culture  3. Chronic right shoulder pain Recommend Meloxicam 7.5 mg daily for mild to moderate pain. Also, recommend heat therapy as needed.  - DG Shoulder Left; Future  4. Pelvic pain - Basic Metabolic Panel - CBC  5. Dizziness - EKG 12-Lead - Orthostatic vital signs  6. Weight gain Recommend a lowfat, low carbohydrate diet divided over 5-6 small meals, increase water intake to 6-8 glasses, and 150 minutes per week of cardiovascular exercise.     7. Solitary pulmonary nodule Reviewed Chest CT from 12/09/16, yielded the following results:  IMPRESSION: 1. 7 mm right lower lobe pulmonary nodule, no change compared with 2015. 2. Minimal emphysematous changes in the apices.  Will repeat in April 2019.   8. Tobacco use disorder Smoking cessation instruction/counseling given:  counseled patient on the dangers of tobacco use, advised patient to stop smoking, and reviewed strategies to maximize success  9. Allergic rhinitis, unspecified seasonality, unspecified trigger - cetirizine (ZYRTEC) 10 MG tablet; Take 1 tablet (10 mg total) by mouth daily.  Dispense: 30 tablet; Refill: 11    RTC: 3 months for chronic  conditions  Nolon NationsLachina Moore Brinton Brandel  MSN, FNP-C [facility] [address]  [place], [place] [id number] [phone]

## 2017-09-26 NOTE — Patient Instructions (Addendum)
For allergic rhinitis, will start a trial of cetirizine 10 mg at bedtime. Your blood pressure is at goal, will continue medications as previously prescribed.  Recommend a DASH diet and low impact cardiovascular exercise routine. Continue to follow-up with infectious disease for hepatitis C. For right shoulder pain, will order a screening x-ray.  Recommend that she continue gabapentin 3 times a day for right arm numbness and tingling Shoulder Pain Many things can cause shoulder pain, including:  An injury.  Moving the arm in the same way again and again (overuse).  Joint pain (arthritis).  Follow these instructions at home: Take these actions to help with your pain:  Squeeze a soft ball or a foam pad as much as you can. This helps to prevent swelling. It also makes the arm stronger.  Take over-the-counter and prescription medicines only as told by your doctor.  If told, put ice on the area: ? Put ice in a plastic bag. ? Place a towel between your skin and the bag. ? Leave the ice on for 20 minutes, 2-3 times per day. Stop putting on ice if it does not help with the pain.  If you were given a shoulder sling or immobilizer: ? Wear it as told. ? Remove it to shower or bathe. ? Move your arm as little as possible. ? Keep your hand moving. This helps prevent swelling.  Contact a doctor if:  Your pain gets worse.  Medicine does not help your pain.  You have new pain in your arm, hand, or fingers. Get help right away if:  Your arm, hand, or fingers: ? Tingle. ? Are numb. ? Are swollen. ? Are painful. ? Turn white or blue. This information is not intended to replace advice given to you by your health care provider. Make sure you discuss any questions you have with your health care provider. Document Released: 01/30/2008 Document Revised: 04/08/2016 Document Reviewed: 12/06/2014 Elsevier Interactive Patient Education  [employer]2018 [employer]

## 2017-09-27 ENCOUNTER — Other Ambulatory Visit: Payer: Self-pay | Admitting: Family Medicine

## 2017-09-27 DIAGNOSIS — K219 Gastro-esophageal reflux disease without esophagitis: Secondary | ICD-10-CM

## 2017-09-27 LAB — BASIC METABOLIC PANEL
BUN/Creatinine Ratio: 14 (ref 12–28)
BUN: 13 mg/dL (ref 8–27)
CALCIUM: 9.7 mg/dL (ref 8.7–10.3)
CO2: 25 mmol/L (ref 20–29)
Chloride: 100 mmol/L (ref 96–106)
Creatinine, Ser: 0.95 mg/dL (ref 0.57–1.00)
GFR calc non Af Amer: 63 mL/min/{1.73_m2} (ref >59)
GFR, EST AFRICAN AMERICAN: 73 mL/min/{1.73_m2} (ref >59)
GLUCOSE: 82 mg/dL (ref 65–99)
POTASSIUM: 3.9 mmol/L (ref 3.5–5.2)
Sodium: 138 mmol/L (ref 134–144)

## 2017-09-27 LAB — CBC
HEMATOCRIT: 35.4 % (ref 34.0–46.6)
HEMOGLOBIN: 12 g/dL (ref 11.1–15.9)
MCH: 33.7 pg — ABNORMAL HIGH (ref 26.6–33.0)
MCHC: 33.9 g/dL (ref 31.5–35.7)
MCV: 99 fL — ABNORMAL HIGH (ref 79–97)
Platelets: 190 10*3/uL (ref 150–379)
RBC: 3.56 x10E6/uL — ABNORMAL LOW (ref 3.77–5.28)
RDW: 13.6 % (ref 12.3–15.4)
WBC: 5.6 10*3/uL (ref 3.4–10.8)

## 2017-09-28 LAB — URINE CULTURE: ORGANISM ID, BACTERIA: NO GROWTH

## 2017-10-29 ENCOUNTER — Other Ambulatory Visit: Payer: Self-pay | Admitting: Family Medicine

## 2017-10-29 DIAGNOSIS — F17200 Nicotine dependence, unspecified, uncomplicated: Secondary | ICD-10-CM

## 2017-11-22 ENCOUNTER — Ambulatory Visit: Payer: Self-pay | Admitting: Family Medicine

## 2017-11-25 ENCOUNTER — Other Ambulatory Visit: Payer: Self-pay | Admitting: Family Medicine

## 2017-11-25 DIAGNOSIS — K219 Gastro-esophageal reflux disease without esophagitis: Secondary | ICD-10-CM

## 2017-12-23 ENCOUNTER — Other Ambulatory Visit: Payer: Self-pay

## 2017-12-24 ENCOUNTER — Ambulatory Visit: Payer: Self-pay | Admitting: Family Medicine

## 2017-12-31 ENCOUNTER — Ambulatory Visit: Payer: Self-pay | Admitting: Internal Medicine

## 2018-02-13 ENCOUNTER — Other Ambulatory Visit: Payer: Self-pay | Admitting: Family Medicine

## 2018-02-13 DIAGNOSIS — G629 Polyneuropathy, unspecified: Secondary | ICD-10-CM

## 2018-02-13 DIAGNOSIS — I10 Essential (primary) hypertension: Secondary | ICD-10-CM

## 2018-02-13 DIAGNOSIS — K219 Gastro-esophageal reflux disease without esophagitis: Secondary | ICD-10-CM

## 2018-09-02 ENCOUNTER — Emergency Department (HOSPITAL_COMMUNITY)
Admission: EM | Admit: 2018-09-02 | Discharge: 2018-09-02 | Disposition: A | Discharge status: Other Acute Inpatient Hospital | Payer: Medicare Other | Attending: Emergency Medicine | Admitting: Emergency Medicine

## 2018-09-02 ENCOUNTER — Encounter (HOSPITAL_COMMUNITY): Payer: Self-pay

## 2018-09-02 ENCOUNTER — Inpatient Hospital Stay (HOSPITAL_COMMUNITY)
Admission: AD | Admit: 2018-09-02 | Discharge: 2018-09-05 | DRG: 880 | Disposition: A | Discharge status: Home / Self Care | Payer: Medicare Other | Source: Intra-hospital | Attending: Psychiatry | Admitting: Psychiatry

## 2018-09-02 ENCOUNTER — Other Ambulatory Visit: Payer: Self-pay

## 2018-09-02 DIAGNOSIS — F1420 Cocaine dependence, uncomplicated: Secondary | ICD-10-CM | POA: Diagnosis present

## 2018-09-02 DIAGNOSIS — F333 Major depressive disorder, recurrent, severe with psychotic symptoms: Secondary | ICD-10-CM

## 2018-09-02 DIAGNOSIS — F1910 Other psychoactive substance abuse, uncomplicated: Secondary | ICD-10-CM | POA: Insufficient documentation

## 2018-09-02 DIAGNOSIS — F1410 Cocaine abuse, uncomplicated: Secondary | ICD-10-CM | POA: Diagnosis not present

## 2018-09-02 DIAGNOSIS — F323 Major depressive disorder, single episode, severe with psychotic features: Secondary | ICD-10-CM | POA: Diagnosis not present

## 2018-09-02 DIAGNOSIS — Z590 Homelessness: Secondary | ICD-10-CM

## 2018-09-02 DIAGNOSIS — Z809 Family history of malignant neoplasm, unspecified: Secondary | ICD-10-CM

## 2018-09-02 DIAGNOSIS — J309 Allergic rhinitis, unspecified: Secondary | ICD-10-CM

## 2018-09-02 DIAGNOSIS — R41843 Psychomotor deficit: Secondary | ICD-10-CM | POA: Diagnosis present

## 2018-09-02 DIAGNOSIS — F17200 Nicotine dependence, unspecified, uncomplicated: Secondary | ICD-10-CM

## 2018-09-02 DIAGNOSIS — I10 Essential (primary) hypertension: Secondary | ICD-10-CM | POA: Diagnosis present

## 2018-09-02 DIAGNOSIS — B1920 Unspecified viral hepatitis C without hepatic coma: Secondary | ICD-10-CM | POA: Diagnosis present

## 2018-09-02 DIAGNOSIS — Z833 Family history of diabetes mellitus: Secondary | ICD-10-CM | POA: Diagnosis not present

## 2018-09-02 DIAGNOSIS — F1010 Alcohol abuse, uncomplicated: Secondary | ICD-10-CM | POA: Diagnosis present

## 2018-09-02 DIAGNOSIS — Z8249 Family history of ischemic heart disease and other diseases of the circulatory system: Secondary | ICD-10-CM

## 2018-09-02 DIAGNOSIS — F17210 Nicotine dependence, cigarettes, uncomplicated: Secondary | ICD-10-CM | POA: Insufficient documentation

## 2018-09-02 DIAGNOSIS — N898 Other specified noninflammatory disorders of vagina: Secondary | ICD-10-CM | POA: Insufficient documentation

## 2018-09-02 DIAGNOSIS — R45851 Suicidal ideations: Secondary | ICD-10-CM | POA: Diagnosis present

## 2018-09-02 DIAGNOSIS — K219 Gastro-esophageal reflux disease without esophagitis: Secondary | ICD-10-CM

## 2018-09-02 LAB — RAPID URINE DRUG SCREEN, HOSP PERFORMED
AMPHETAMINES: NOT DETECTED
Barbiturates: NOT DETECTED
Benzodiazepines: NOT DETECTED
Cocaine: POSITIVE — AB
Opiates: NOT DETECTED
Tetrahydrocannabinol: POSITIVE — AB

## 2018-09-02 LAB — CBC
HEMATOCRIT: 41.7 % (ref 36.0–46.0)
HEMOGLOBIN: 14.1 g/dL (ref 12.0–15.0)
MCH: 33.5 pg (ref 26.0–34.0)
MCHC: 33.8 g/dL (ref 30.0–36.0)
MCV: 99 fL (ref 80.0–100.0)
Platelets: 128 10*3/uL — ABNORMAL LOW (ref 150–400)
RBC: 4.21 MIL/uL (ref 3.87–5.11)
RDW: 13.9 % (ref 11.5–15.5)
WBC: 5.8 10*3/uL (ref 4.0–10.5)
nRBC: 0 % (ref 0.0–0.2)

## 2018-09-02 LAB — URINALYSIS, ROUTINE W REFLEX MICROSCOPIC
BACTERIA UA: NONE SEEN
Bilirubin Urine: NEGATIVE
Glucose, UA: NEGATIVE mg/dL
Hgb urine dipstick: NEGATIVE
Ketones, ur: NEGATIVE mg/dL
Nitrite: NEGATIVE
PROTEIN: 30 mg/dL — AB
Specific Gravity, Urine: 1.018 (ref 1.005–1.030)
pH: 6 (ref 5.0–8.0)

## 2018-09-02 LAB — COMPREHENSIVE METABOLIC PANEL
ALT: 11 U/L (ref 0–44)
AST: 25 U/L (ref 15–41)
Albumin: 3.8 g/dL (ref 3.5–5.0)
Alkaline Phosphatase: 63 U/L (ref 38–126)
Anion gap: 10 (ref 5–15)
BUN: 10 mg/dL (ref 8–23)
CO2: 26 mmol/L (ref 22–32)
Calcium: 9.1 mg/dL (ref 8.9–10.3)
Chloride: 104 mmol/L (ref 98–111)
Creatinine, Ser: 0.78 mg/dL (ref 0.44–1.00)
GFR calc Af Amer: >60 mL/min (ref >60)
GFR calc non Af Amer: >60 mL/min (ref >60)
Glucose, Bld: 104 mg/dL — ABNORMAL HIGH (ref 70–99)
Potassium: 2.8 mmol/L — ABNORMAL LOW (ref 3.5–5.1)
Sodium: 140 mmol/L (ref 135–145)
Total Bilirubin: 0.6 mg/dL (ref 0.3–1.2)
Total Protein: 7.7 g/dL (ref 6.5–8.1)

## 2018-09-02 LAB — WET PREP, GENITAL
Sperm: NONE SEEN
Yeast Wet Prep HPF POC: NONE SEEN

## 2018-09-02 LAB — ACETAMINOPHEN LEVEL: Acetaminophen (Tylenol), Serum: <10 ug/mL — ABNORMAL LOW (ref 10–30)

## 2018-09-02 LAB — SALICYLATE LEVEL: Salicylate Lvl: <7 mg/dL (ref 2.8–30.0)

## 2018-09-02 LAB — ETHANOL: Alcohol, Ethyl (B): <10 mg/dL (ref <10)

## 2018-09-02 MED ORDER — LISINOPRIL-HYDROCHLOROTHIAZIDE 10-12.5 MG PO TABS: 1.0000 | ORAL_TABLET | Freq: Every day | ORAL | Status: DC

## 2018-09-02 MED ORDER — NICOTINE 21 MG/24HR TD PT24
21.0000 mg | MEDICATED_PATCH | Freq: Every day | TRANSDERMAL | Status: DC
  Administered 2018-09-03 – 2018-09-05 (×3): 21 mg via TRANSDERMAL
  Filled 2018-09-02 (×5): qty 1

## 2018-09-02 MED ORDER — IBUPROFEN 600 MG PO TABS: 600.0000 mg | ORAL_TABLET | Freq: Three times a day (TID) | ORAL | Status: DC | PRN

## 2018-09-02 MED ORDER — NICOTINE 21 MG/24HR TD PT24
21.0000 mg | MEDICATED_PATCH | Freq: Every day | TRANSDERMAL | Status: DC
  Administered 2018-09-02: 21 mg via TRANSDERMAL

## 2018-09-02 MED ORDER — LISINOPRIL 10 MG PO TABS
10.0000 mg | ORAL_TABLET | Freq: Every day | ORAL | Status: DC
  Administered 2018-09-03 – 2018-09-05 (×3): 10 mg via ORAL
  Filled 2018-09-02 (×2): qty 1
  Filled 2018-09-02: qty 2
  Filled 2018-09-02 (×2): qty 1

## 2018-09-02 MED ORDER — HYDROCHLOROTHIAZIDE 12.5 MG PO CAPS
12.5000 mg | ORAL_CAPSULE | Freq: Every day | ORAL | Status: DC
  Filled 2018-09-02: qty 1

## 2018-09-02 MED ORDER — TRAZODONE HCL 50 MG PO TABS
25.0000 mg | ORAL_TABLET | Freq: Every evening | ORAL | Status: DC | PRN
  Filled 2018-09-02: qty 1

## 2018-09-02 MED ORDER — TRAZODONE HCL 50 MG PO TABS: 25.0000 mg | ORAL_TABLET | Freq: Every evening | ORAL | Status: DC | PRN

## 2018-09-02 MED ORDER — HYDROCHLOROTHIAZIDE 12.5 MG PO CAPS
12.5000 mg | ORAL_CAPSULE | Freq: Every day | ORAL | Status: DC
  Administered 2018-09-03 – 2018-09-05 (×3): 12.5 mg via ORAL
  Filled 2018-09-02 (×6): qty 1

## 2018-09-02 MED ORDER — POTASSIUM CHLORIDE CRYS ER 20 MEQ PO TBCR
40.0000 meq | EXTENDED_RELEASE_TABLET | Freq: Once | ORAL | Status: AC
  Administered 2018-09-02: 40 meq via ORAL
  Filled 2018-09-02: qty 2

## 2018-09-02 MED ORDER — LISINOPRIL 10 MG PO TABS
10.0000 mg | ORAL_TABLET | Freq: Every day | ORAL | Status: DC
  Administered 2018-09-02: 10 mg via ORAL
  Filled 2018-09-02: qty 1

## 2018-09-02 MED ORDER — IBUPROFEN 200 MG PO TABS: 600.0000 mg | ORAL_TABLET | Freq: Three times a day (TID) | ORAL | Status: DC | PRN

## 2018-09-02 MED ORDER — PANTOPRAZOLE SODIUM 40 MG PO TBEC
40.0000 mg | DELAYED_RELEASE_TABLET | Freq: Every day | ORAL | Status: DC
  Administered 2018-09-03 – 2018-09-05 (×3): 40 mg via ORAL
  Filled 2018-09-02 (×6): qty 1

## 2018-09-02 MED ORDER — LORATADINE 10 MG PO TABS
10.0000 mg | ORAL_TABLET | Freq: Every day | ORAL | Status: DC
  Administered 2018-09-02: 10 mg via ORAL
  Filled 2018-09-02: qty 1

## 2018-09-02 MED ORDER — AZITHROMYCIN 250 MG PO TABS
2000.0000 mg | ORAL_TABLET | Freq: Once | ORAL | Status: AC
  Administered 2018-09-02: 2000 mg via ORAL
  Filled 2018-09-02: qty 8

## 2018-09-02 MED ORDER — PANTOPRAZOLE SODIUM 40 MG PO TBEC
40.0000 mg | DELAYED_RELEASE_TABLET | Freq: Every day | ORAL | Status: DC
  Administered 2018-09-02: 40 mg via ORAL
  Filled 2018-09-02: qty 1

## 2018-09-02 MED ORDER — NICOTINE 21 MG/24HR TD PT24
21.0000 mg | MEDICATED_PATCH | Freq: Every day | TRANSDERMAL | Status: DC
  Administered 2018-09-02: 21 mg via TRANSDERMAL
  Filled 2018-09-02: qty 1

## 2018-09-02 MED ORDER — POTASSIUM CHLORIDE CRYS ER 20 MEQ PO TBCR
40.0000 meq | EXTENDED_RELEASE_TABLET | Freq: Once | ORAL | Status: AC
  Administered 2018-09-03: 40 meq via ORAL
  Filled 2018-09-02 (×3): qty 2

## 2018-09-02 MED ORDER — LORATADINE 10 MG PO TABS
10.0000 mg | ORAL_TABLET | Freq: Every day | ORAL | Status: DC
  Administered 2018-09-03 – 2018-09-05 (×3): 10 mg via ORAL
  Filled 2018-09-02 (×6): qty 1

## 2018-09-02 NOTE — BH Assessment (Signed)
Assessment Note  Dana Mcintosh is an 68 y.o. female presenting voluntarily to [facility] ED via [employer] complaining of crack cocaine addiction and suicidal ideation. Patient reports she woke up this morning feeling like she either needed to get help or kill herself. She stated she went to [facility] and threatened to walk into traffic so the police were called. Patient reports she began using crack cocaine at age 61. She reports that her longest period of sobriety was 14 years while she was in prison. She reports occasional alcohol use but does not feel this is an addiction.  She states that she is currently homeless and has been for the past 2 months. Until then she was living in an apartment that was subsidized by her outpatient substance use program. However, she states the apartment was surrounded by drug dealers and she could not stay clean. She has 5 children who were supportive but do not enable her. One of her goals is to rekindle those relationships once she is sober. Patient has a history of depression and was taking Trazodone and Seroquel, which she reported was helpful. Patient is not currently on medications and does not have an outpatient provider. Patient denies any current criminal charges. Patient denies history of abuse. Patient denies HI. She endorses visual hallucinations of demons when she is under the influence of crack cocaine.  Patient was alert and oriented x 4. She was dressed in scrubs. Her speech was logical and coherent. She was pleasant and cooperative through out assessment process. Her mood was depressed and affect was congruent. Patient's thought process was coherent. She did not appear to be responding to internal stimuli or experiencing delusional thought content at time of assessment.  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 Depression   . ETOH abuse   . Hepatitis C   .  Hypertension   . Mental disorder   . Substance abuse [facility])     Past Surgical History:  Procedure Laterality Date  . CHOLECYSTECTOMY    . TUBAL LIGATION      Family History:  Family History  Problem Relation Age of Onset  . Diabetes Mother   . Hypertension Mother   . Cancer Father     Social History:  reports that she has been smoking cigarettes. She has a 20.00 pack-year smoking history. She has never used smokeless tobacco. She reports current alcohol use of about 44.0 standard drinks of alcohol per week. She reports current drug use. Frequency: 7.00 times per week. Drugs: Cocaine, Marijuana, and "Crack" cocaine.  Additional Social History:  Alcohol / Drug Use Pain Medications: see MAR Prescriptions: see MAR Over the Counter: see MAR History of alcohol / drug use?: Yes Longest period of sobriety (when/how long): 14 years Substance #1 Name of Substance 1: crack cocaine 1 - Age of First Use: 21 1 - Amount (size/oz): varies 1 - Frequency: varies 1 - Duration: 40 years 1 - Last Use / Amount: 09/01/2017  CIWA: CIWA-Ar BP: (!) 155/110 Pulse Rate: 67 COWS:    Allergies:  Allergies  Allergen Reactions  . Penicillins Hives    DID THE REACTION INVOLVE: Swelling of the face/tongue/throat, SOB, or low BP? Y Sudden or severe rash/hives, skin peeling, or the inside of the mouth or nose? n Did it require medical treatment? N When did it last happen?more than 10 yrs If all above answers are "NO",  may proceed with cephalosporin use.     Home Medications: (Not in a hospital admission)   OB/GYN Status:  Patient's last menstrual period was 07/27/1997.  General Assessment Data Location of Assessment: [facility] ED TTS Assessment: In system Is this a Tele or Face-to-Face Assessment?: Face-to-Face Is this an Initial Assessment or a Re-assessment for this encounter?: Initial Assessment Patient Accompanied by:: N/A Language Other than English: No Living Arrangements:  Homeless/Shelter What gender do you identify as?: Female Marital status: Single Maiden name: Abadie Pregnancy Status: No Living Arrangements: (homeless) Can pt return to current living arrangement?: Yes Admission Status: Voluntary Is patient capable of signing voluntary admission?: Yes Referral Source: Self/Family/Friend Insurance type: Medicare/ Medicaid     Crisis Care Plan Living Arrangements: (homeless)     Risk to self with the past 6 months Suicidal Ideation: Yes-Currently Present Has patient been a risk to self within the past 6 months prior to admission? : No Suicidal Intent: No-Not Currently/Within Last 6 Months Has patient had any suicidal intent within the past 6 months prior to admission? : Yes Is patient at risk for suicide?: Yes Suicidal Plan?: No-Not Currently/Within Last 6 Months(running into traffic) Has patient had any suicidal plan within the past 6 months prior to admission? : Yes Access to Means: Yes What has been your use of drugs/alcohol within the last 12 months?: crack cocaine and alcohol yesterday Previous Attempts/Gestures: No How many times?: 0 Other Self Harm Risks: drug use Triggers for Past Attempts: None known Intentional Self Injurious Behavior: None Family Suicide History: No Recent stressful life event(s): Financial Problems, Loss (Comment), Conflict (Comment), Other (Comment)(conflict with family, homeless) Persecutory voices/beliefs?: No Depression: Yes Depression Symptoms: Despondent, Insomnia, Tearfulness, Isolating, Fatigue, Guilt, Loss of interest in usual pleasures, Feeling worthless/self pity, Feeling angry/irritable Substance abuse history and/or treatment for substance abuse?: Yes Suicide prevention information given to non-admitted patients: Not applicable  Risk to Others within the past 6 months Homicidal Ideation: No Does patient have any lifetime risk of violence toward others beyond the six months prior to admission? :  No Thoughts of Harm to Others: No Current Homicidal Intent: No Current Homicidal Plan: No Access to Homicidal Means: No Identified Victim: none  History of harm to others?: No Assessment of Violence: None Noted Violent Behavior Description: none reported Does patient have access to weapons?: No Criminal Charges Pending?: No Does patient have a court date: No Is patient on probation?: No  Psychosis Hallucinations: Visual(substance induced) Delusions: None noted  Mental Status Report Appearance/Hygiene: Bizarre Eye Contact: Poor Motor Activity: Rigidity Speech: Logical/coherent Level of Consciousness: Alert Mood: Depressed, Guilty Affect: Sad Anxiety Level: None Thought Processes: Coherent, Relevant Judgement: Impaired Orientation: Person, Place, Time, Situation Obsessive Compulsive Thoughts/Behaviors: None  Cognitive Functioning Concentration: Normal Memory: Recent Intact, Remote Intact Is patient IDD: No Insight: Fair Impulse Control: Poor Appetite: Poor Have you had any weight changes? : Loss Amount of the weight change? (lbs): (UTA) Sleep: No Change Total Hours of Sleep: 5 Vegetative Symptoms: None  ADLScreening [facility] Assessment Services) Patient's cognitive ability adequate to safely complete daily activities?: Yes Patient able to express need for assistance with ADLs?: Yes Independently performs ADLs?: Yes (appropriate for developmental age)  Prior Inpatient Therapy Prior Inpatient Therapy: Yes Prior Therapy Dates: (UTA) Prior Therapy Facilty/Provider(s): (unable to recall- rehab on [facility]) Reason for Treatment: substance use and depression'  Prior Outpatient Therapy Prior Outpatient Therapy: Yes Prior Therapy Dates: ("years ago") Prior Therapy Facilty/Provider(s): (unable to recall name) Reason for Treatment: substance use and  depression Does patient have an ACCT team?: No Does patient have Intensive In-House Services?  : No Does patient have  [facility] services? : No Does patient have P4CC services?: No  ADL Screening (condition at time of admission) Patient's cognitive ability adequate to safely complete daily activities?: Yes Is the patient deaf or have difficulty hearing?: No Does the patient have difficulty seeing, even when wearing glasses/contacts?: No Does the patient have difficulty concentrating, remembering, or making decisions?: No Patient able to express need for assistance with ADLs?: Yes Does the patient have difficulty dressing or bathing?: No Independently performs ADLs?: Yes (appropriate for developmental age) Does the patient have difficulty walking or climbing stairs?: No Weakness of Legs: None Weakness of Arms/Hands: None  Home Assistive Devices/Equipment Home Assistive Devices/Equipment: None  Therapy Consults (therapy consults require a physician order) PT Evaluation Needed: No OT Evalulation Needed: No SLP Evaluation Needed: No Abuse/Neglect Assessment (Assessment to be complete while patient is alone) Abuse/Neglect Assessment Can Be Completed: Yes Physical Abuse: Denies Verbal Abuse: Denies Sexual Abuse: Denies Exploitation of patient/patient's resources: Denies Self-Neglect: Denies Values / Beliefs Cultural Requests During Hospitalization: None Spiritual Requests During Hospitalization: None Consults Spiritual Care Consult Needed: No Social Work Consult Needed: No Merchant navy officerAdvance Directives (For Healthcare) Does Patient Have a Medical Advance Directive?: No Would patient like information on creating a medical advance directive?: No - Patient declined          Disposition: Per Malachy Chamberakia Starkes, PMHNP patient meets in patient criteria. TTS to secure placement. Disposition Initial Assessment Completed for this Encounter: Yes  On Site Evaluation by:   Reviewed with Physician:    Celedonio MiyamotoMeredith  Demarrion Meiklejohn 09/02/2018 1:21 PM

## 2018-09-02 NOTE — ED Notes (Signed)
Pt sleeping at present, no distress noted, calm & cooperative.  Visual hallucinations noted.  Monitoring for safety, Q 15 min checks in effect.

## 2018-09-02 NOTE — ED Provider Notes (Signed)
[facility]-EMERGENCY DEPT Provider Note   CSN: [id number] Arrival date & time: 09/02/18  1041     History   Chief Complaint Chief Complaint  Patient presents with  . Suicidal    HPI Dana Mcintosh is a 68 y.o. female.  Patient is a 68 year old female with a history of depression, alcohol abuse, hypertension, cocaine abuse, hepatitis presenting today from the [facility] with involuntary commitment papers for suicidal ideation.  Patient states that she is just tired and she would just like to die.  There were knives found on her belongings but she has been cooperative and denies any homicidal ideation.  She is denying any chest pain, shortness of breath, vomiting but does have a chronic cough which is unchanged.  She does smoke cigarettes as well but denies history of emphysema or need for an inhaler.  She also states she is having abdominal pain for a long time but in the last 2 weeks is also had vaginal discharge.  She has been sexually active within the last month and did not use protection.  She denies any urinary symptoms.  The history is provided by the patient.    Past Medical History:  Diagnosis Date  . Depression   . ETOH abuse   . Hepatitis C   . Hypertension   . Mental disorder   . Substance abuse [facility])     Patient Active Problem List   Diagnosis Date Noted  . Insomnia 12/16/2016  . Daytime sleepiness 12/16/2016  . Abdominal pain 07/25/2016  . Tobacco use disorder 11/03/2015  . Plantar fasciitis, bilateral 10/05/2015  . Transaminitis 10/05/2015  . Polysubstance abuse (HCC) 08/30/2015  . Essential hypertension, benign 08/30/2015  . Solitary pulmonary nodule 08/30/2015  . GERD (gastroesophageal reflux disease) 08/30/2015  . Healthcare maintenance 08/30/2015  . Alcohol dependence (HCC) 06/17/2014  . Severe major depression with psychotic features (HCC) 06/17/2014  . Chronic hepatitis C (HCC) 04/02/2014  . Cirrhosis (HCC) 04/02/2014  . Cocaine  abuse (HCC) 07/30/2012    Class: Acute  . Cannabis abuse 07/30/2012    Class: Acute    Past Surgical History:  Procedure Laterality Date  . CHOLECYSTECTOMY    . TUBAL LIGATION       OB History   No obstetric history on file.      Home Medications    Prior to Admission medications   Medication Sig Start Date End Date Taking? Authorizing Provider  cetirizine (ZYRTEC) 10 MG tablet Take 1 tablet (10 mg total) by mouth daily. Patient not taking: Reported on 09/02/2018 09/26/17   Massie Maroon, FNP  gabapentin (NEURONTIN) 300 MG capsule TAKE 1 CAPSULE (300 MG TOTAL) BY MOUTH 3 (THREE) TIMES DAILY. FOR PAIN Patient not taking: Reported on 09/02/2018 02/13/18   Massie Maroon, FNP  lisinopril-hydrochlorothiazide (PRINZIDE,ZESTORETIC) 10-12.5 MG tablet TAKE 1 TABLET BY MOUTH DAILY. Patient not taking: Reported on 09/02/2018 02/13/18 02/13/19  Massie Maroon, FNP  nicotine (NICODERM CQ - DOSED IN MG/24 HOURS) 21 mg/24hr patch Place 1 patch (21 mg total) onto the skin daily. Patient not taking: Reported on 09/02/2018 10/29/17   Massie Maroon, FNP  omeprazole (PRILOSEC) 20 MG capsule TAKE 1 CAPSULE (20 MG TOTAL) BY MOUTH DAILY. Patient not taking: Reported on 09/02/2018 02/13/18 02/13/19  Massie Maroon, FNP  ribavirin (COPEGUS) 200 MG tablet Take 3 tablets (600 mg total) by mouth daily. Patient not taking: Reported on 09/02/2018 06/13/17   Gardiner Barefoot, MD  Sofosbuvir-Velpatasvir [facility]) 400-100  MG TABS Take 1 tablet by mouth daily with breakfast. Patient not taking: Reported on 09/02/2018 06/18/17   Gardiner Barefootomer, Robert W, MD  traMADol (ULTRAM) 50 MG tablet Take 1 tablet (50 mg total) by mouth every 8 (eight) hours as needed. Patient not taking: Reported on 06/06/2017 02/12/17   Massie MaroonHollis, Lachina M, FNP  traZODone (DESYREL) 50 MG tablet Take 0.5 tablets (25 mg total) by mouth at bedtime as needed for sleep. Patient not taking: Reported on 06/13/2017 06/06/17   Massie MaroonHollis, Lachina M, FNP    Family  History Family History  Problem Relation Age of Onset  . Diabetes Mother   . Hypertension Mother   . Cancer Father     Social History Social History   Tobacco Use  . Smoking status: Current Every Day Smoker    Packs/day: 0.50    Years: 40.00    Pack years: 20.00    Types: Cigarettes  . Smokeless tobacco: Never Used  . Tobacco comment: on nicotene patch  Substance Use Topics  . Alcohol use: Yes    Alcohol/week: 44.0 standard drinks    Types: 24 Cans of beer, 20 Shots of liquor per week  . Drug use: Yes    Frequency: 7.0 times per week    Types: Cocaine, Marijuana, "Crack" cocaine     Allergies   Penicillins   Review of Systems Review of Systems  All other systems reviewed and are negative.    Physical Exam Updated Vital Signs BP (!) 155/110 (BP Location: Left Arm)   Pulse 67   Temp 98.3 F (36.8 C) (Oral)   Resp 14   Ht 5' 6.5" (1.689 m)   Wt 59 kg   LMP 07/27/1997   SpO2 100%   BMI 20.67 kg/m   Physical Exam Vitals signs and nursing note reviewed.  Constitutional:      General: She is not in acute distress.    Appearance: She is well-developed.     Comments: Disheveled and unkempt  HENT:     Head: Normocephalic and atraumatic.  Eyes:     Conjunctiva/sclera: Conjunctivae normal.     Pupils: Pupils are equal, round, and reactive to light.  Neck:     Musculoskeletal: Normal range of motion and neck supple.  Cardiovascular:     Rate and Rhythm: Normal rate and regular rhythm.     Heart sounds: No murmur.  Pulmonary:     Effort: Pulmonary effort is normal. No respiratory distress.     Breath sounds: Normal breath sounds. No wheezing or rales.  Abdominal:     General: There is no distension.     Palpations: Abdomen is soft.     Tenderness: There is abdominal tenderness in the suprapubic area. There is no guarding or rebound.  Genitourinary:    Vagina: Vaginal discharge present.     Cervix: Discharge present. No cervical motion tenderness.      Uterus: Normal.      Adnexa: Right adnexa normal and left adnexa normal.  Musculoskeletal: Normal range of motion.        General: No tenderness.  Skin:    General: Skin is warm and dry.     Findings: No erythema or rash.  Neurological:     Mental Status: She is alert and oriented to person, place, and time.  Psychiatric:        Attention and Perception: She perceives visual hallucinations. She does not perceive auditory hallucinations.        Mood and Affect:  Mood is depressed. Affect is blunt.        Behavior: Behavior normal. Behavior is cooperative.        Thought Content: Thought content includes suicidal ideation. Thought content does not include homicidal ideation. Thought content does not include suicidal plan.      ED Treatments / Results  Labs (all labs ordered are listed, but only abnormal results are displayed) Labs Reviewed  COMPREHENSIVE METABOLIC PANEL - Abnormal; Notable for the following components:      Result Value   Potassium 2.8 (*)    Glucose, Bld 104 (*)    All other components within normal limits  ACETAMINOPHEN LEVEL - Abnormal; Notable for the following components:   Acetaminophen (Tylenol), Serum <10 (*)    All other components within normal limits  CBC - Abnormal; Notable for the following components:   Platelets 128 (*)    All other components within normal limits  RAPID URINE DRUG SCREEN, HOSP PERFORMED - Abnormal; Notable for the following components:   Cocaine POSITIVE (*)    Tetrahydrocannabinol POSITIVE (*)    All other components within normal limits  WET PREP, GENITAL  ETHANOL  SALICYLATE LEVEL  URINALYSIS, ROUTINE W REFLEX MICROSCOPIC  GC/CHLAMYDIA PROBE AMP ([place]    EKG None  Radiology No results found.  Procedures Procedures (including critical care time)  Medications Ordered in ED Medications - No data to display   Initial Impression / Assessment and Plan / ED Course  I have reviewed the triage vital  signs and the nursing notes.  Pertinent labs & imaging results that were available during my care of the patient were reviewed by me and considered in my medical decision making (see chart for details).     Patient presenting today for medical clearance and suicidal ideation.  She is currently IVC.  Patient does have a history of hypertension and alcohol use.  However she denies any history of withdrawal if she does not drink alcohol.  Patient's labs are consistent with mild hypokalemia of 2.8 but otherwise normal CBC, acetaminophen levels, alcohol levels.  UDS is positive for marijuana and cocaine.  Patient is complaining of vaginal discharge and pelvic discomfort.  We will do a pelvic exam and treat for GC and chlamydia.  Patient was started on her home meds and otherwise clear for psychiatric evaluation.  Potassium was replaced orally.  Patient also has discharge on exam without symptoms concerning for pelvic inflammatory disease.  Will treat with azithromycin 2g due to PCN allergy.  Final Clinical Impressions(s) / ED Diagnoses   Final diagnoses:  MDD (major depressive disorder), recurrent, severe, with psychosis (HCC)  Polysubstance abuse (HCC)  Suicidal ideation  Vaginal discharge    ED Discharge Orders    None       Gwyneth SproutPlunkett, Dravyn Severs, MD 09/02/18 1542

## 2018-09-02 NOTE — [facility] Counselor (Signed)
Dr. Rush Landmark rescinded IVC b/c patient signed voluntary paperwork. Notice of commitment change to be sent with patient.

## 2018-09-02 NOTE — ED Notes (Signed)
Report called to RN Mike, [facility] [address].  Pending [facility] transport. 

## 2018-09-02 NOTE — ED Notes (Signed)
Bed: WBH37 Expected date:  Expected time:  Means of arrival:  Comments: Hold for 27 

## 2018-09-02 NOTE — ED Triage Notes (Signed)
Patient was at [facility] today and was threatening to jump out in front of traffic. [employer] reported that the patient had 2 knives on her, but has been cooperative with them. Patient states she is seeing dead people. Patient denies any auditory hallucinations or HI. Patient states she used crack and a 40 ounce beer last night.

## 2018-09-02 NOTE — ED Notes (Signed)
Bed: WA27 Expected date:  Expected time:  Means of arrival:  Comments: Hold for triage 3

## 2018-09-02 NOTE — ED Notes (Signed)
Bed: [facility]3 Expected date:  Expected time:  Means of arrival:  Comments: 

## 2018-09-03 DIAGNOSIS — F1410 Cocaine abuse, uncomplicated: Secondary | ICD-10-CM

## 2018-09-03 DIAGNOSIS — F323 Major depressive disorder, single episode, severe with psychotic features: Secondary | ICD-10-CM

## 2018-09-03 DIAGNOSIS — R45851 Suicidal ideations: Principal | ICD-10-CM

## 2018-09-03 LAB — GC/CHLAMYDIA PROBE AMP (~~LOC~~) NOT AT ARMC
Chlamydia: POSITIVE — AB
Neisseria Gonorrhea: NEGATIVE

## 2018-09-03 LAB — HIV ANTIBODY (ROUTINE TESTING W REFLEX): HIV SCREEN 4TH GENERATION: NONREACTIVE

## 2018-09-03 LAB — RPR, QUANT+TP ABS (REFLEX)
Rapid Plasma Reagin, Quant: 1:1 {titer} — ABNORMAL HIGH
T Pallidum Abs: REACTIVE — AB

## 2018-09-03 LAB — RPR: RPR Ser Ql: REACTIVE — AB

## 2018-09-03 MED ORDER — ADULT MULTIVITAMIN W/MINERALS CH
1.0000 | ORAL_TABLET | Freq: Every day | ORAL | Status: DC
  Administered 2018-09-03 – 2018-09-05 (×3): 1 via ORAL
  Filled 2018-09-03 (×4): qty 1

## 2018-09-03 MED ORDER — ENSURE ENLIVE PO LIQD
237.0000 mL | Freq: Two times a day (BID) | ORAL | Status: DC
  Administered 2018-09-04 – 2018-09-05 (×2): 237 mL via ORAL

## 2018-09-03 MED ORDER — PROPRANOLOL HCL 10 MG PO TABS: 10.0000 mg | ORAL_TABLET | ORAL | Status: DC | PRN

## 2018-09-03 MED ORDER — FLUOXETINE HCL 20 MG PO CAPS
20.0000 mg | ORAL_CAPSULE | Freq: Every day | ORAL | Status: DC
  Administered 2018-09-03 – 2018-09-05 (×3): 20 mg via ORAL
  Filled 2018-09-03 (×5): qty 1

## 2018-09-03 MED ORDER — TRAZODONE HCL 100 MG PO TABS
100.0000 mg | ORAL_TABLET | Freq: Every evening | ORAL | Status: DC | PRN
  Administered 2018-09-03 – 2018-09-04 (×2): 100 mg via ORAL
  Filled 2018-09-03: qty 7

## 2018-09-03 NOTE — Progress Notes (Signed)
Nursing Progress Note: 7p-7a D: Pt currently presents with a anxious/pleasant affect and behavior. Pt states "I had a better day. I am trying to stay positive." Interacting appropriately with the milieu. Pt reports fair sleep during the previous night with current medication regimen. Pt did not attend wrap-up group.  A: Pt provided with medications per providers orders. Pt's labs and vitals were monitored throughout the night. Pt supported emotionally and encouraged to express concerns and questions. Pt educated on medications.  R: Pt's safety ensured with 15 minute and environmental checks. Pt currently denies SI, HI, and AH and endorses VH. Pt verbally contracts to seek staff if SI,HI, or AVH occurs and to consult with staff before acting on any harmful thoughts. Will continue to monitor.

## 2018-09-03 NOTE — Progress Notes (Signed)
NUTRITION ASSESSMENT  Pt identified as at risk on the Malnutrition Screen Tool  INTERVENTION: - Will order Ensure Enlive BID, each supplement provides 350 kcal and 20 grams of protein. - Will order daily multivitamin with minerals. - Continue to encourage PO intakes.    NUTRITION DIAGNOSIS: Unintentional weight loss related to sub-optimal intake as evidenced by pt report.   Goal: Pt to meet >/= 90% of their estimated nutrition needs.  Monitor:  PO intake  Assessment:  Patient admitted for SI, SA, and depression. Patient also requesting detox/help with getting off of crack. Per chart review, current weight is 126 lb and weight on 09/26/17 was 159 lb. This indicates 33 lb weight loss (21% body weight) in ~1 year; this is significant for time frame.   68 y.o. female  Height: Ht Readings from Last 1 Encounters:  09/02/18 5\' 6"  (1.676 m)    Weight: Wt Readings from Last 1 Encounters:  09/02/18 57.2 kg    Weight Hx: Wt Readings from Last 10 Encounters:  09/02/18 57.2 kg  09/02/18 59 kg  09/26/17 72.1 kg  06/13/17 65.8 kg  06/06/17 68 kg  03/22/17 65.3 kg  02/12/17 64 kg  01/07/17 63 kg  12/21/16 65.3 kg  12/14/16 64 kg    BMI:  Body mass index is 20.34 kg/m. Pt meets criteria for normal weight based on current BMI.  Estimated Nutritional Needs: Kcal: 25-30 kcal/kg Protein: > 1 gram protein/kg Fluid: 1 ml/kcal  Diet Order:  Diet Order    None     Pt is also offered choice of unit snacks mid-morning and mid-afternoon.  Pt is eating as desired.   Lab results and medications reviewed.     Trenton Gammon, MS, RD, LDN, [facility] Inpatient Clinical Dietitian Pager # [phone] After hours/weekend pager # [phone]

## 2018-09-03 NOTE — Progress Notes (Signed)
CSW following up with patient's requests for residential substance use treatment. Patient states she is no longer interested in [place], due to the 2 year commitment. CSW provided print outs with intake screening phone number, should patient change her mind.  [facility] referral faxed out. CSW will follow up.  Patient inquiring about free clothing resources; specifically for women's undergarments. Patient aware of [employer], but unable to access their services. CSW to look into additional clothing resources for patient.  Enid Cutter, LCSW-A Clinical Social Worker

## 2018-09-03 NOTE — BHH Suicide Risk Assessment (Signed)
[facility] Admission Suicide Risk Assessment   Nursing information obtained from:  Patient Demographic factors:  Living alone, Unemployed Current Mental Status:  Suicidal ideation indicated by patient Loss Factors:  Loss of significant relationship Historical Factors:  Prior suicide attempts Risk Reduction Factors:  Positive social support  Total Time spent with patient: 30 minutes Principal Problem: Complains of depression/cocaine abuse Diagnosis:  Active Problems:   Cocaine abuse (HCC)   Severe major depression with psychotic features (HCC)  Subjective Data:  Patient reported suicidal thoughts and some plans but cannot contract here main risk is from substance abuse and probable homelessness Continued Clinical Symptoms:  Alcohol Use Disorder Identification Test Final Score (AUDIT): 2 The "Alcohol Use Disorders Identification Test", Guidelines for Use in Primary Care, Second Edition.  World Science writer [facility]). Score between 0-7:  no or low risk or alcohol related problems. Score between 8-15:  moderate risk of alcohol related problems. Score between 16-19:  high risk of alcohol related problems. Score 20 or above:  warrants further diagnostic evaluation for alcohol dependence and treatment.   CLINICAL FACTORS:   Alcohol/Substance Abuse/Dependencies       COGNITIVE FEATURES THAT CONTRIBUTE TO RISK:  Polarized thinking    SUICIDE RISK:   Minimal: No identifiable suicidal ideation.  Patients presenting with no risk factors but with morbid ruminations; may be classified as minimal risk based on the severity of the depressive symptoms  PLAN OF CARE: Monitor for withdrawal begin antidepressant therapy and groups  I certify that inpatient services furnished can reasonably be expected to improve the patient's condition.   Malvin Johns, MD 09/03/2018, 10:57 AM

## 2018-09-03 NOTE — H&P (Signed)
Psychiatric Admission Assessment Adult  Patient Identification: Dana Mcintosh MRN:  [id number] Date of Evaluation:  09/03/2018 Chief Complaint:  cocaine use disorder  mdd recurrent severe  Principal Diagnosis: Depression, chronic cocaine abuse and dependencies, cannabis dependency Diagnosis:  Active Problems:   Cocaine abuse (HCC)   Severe major depression with psychotic features (HCC)  History of Present Illness:  This is a repeat admission for this 68 year old patient currently homeless but reporting in vague terms she may have a place to stay upon discharge but would prefer rehab, at any rate she is a chronic history of cocaine abuse, cannabis abuse, alcohol abuse and presents reporting suicidal thoughts and plans.  She is admitted voluntarily. Patient is a longstanding dependency to crack cocaine since age 27 longest period of sobriety though was 14 years when she was incarcerated, presenting this time with a drug screen positive for cocaine and cannabis but a negligible alcohol level. She did endorse suicidal thoughts on presentation with thoughts of walking into traffic, she acknowledges past treatment with trazodone and Seroquel she also acknowledged having hallucinations when under the influence of crack cocaine but not at baseline. At the present time can contract for safety here is generally cooperative in the interview process denies cravings. His current psychotic symptoms, no EPS or TD  Associated Signs/Symptoms: Depression Symptoms:  psychomotor retardation, (Hypo) Manic Symptoms:  Hallucinations, Anxiety Symptoms:  n/a Psychotic Symptoms:  Hallucinations: Visual PTSD Symptoms: NA Total Time spent with patient: 45 minutes  Past Psychiatric History: Prior admissions with similar presentations  Is the patient at risk to self? Yes.    Has the patient been a risk to self in the past 6 months? No.  Has the patient been a risk to self within the distant past? Yes.    Is the  patient a risk to others? no  Has the patient been a risk to others in the past 6 months? No.  Has the patient been a risk to others within the distant past? No.  Alcohol Screening: 1. How often do you have a drink containing alcohol?: Monthly or less 2. How many drinks containing alcohol do you have on a typical day when you are drinking?: 3 or 4 3. How often do you have six or more drinks on one occasion?: Never AUDIT-C Score: 2 4. How often during the last year have you found that you were not able to stop drinking once you had started?: Never 5. How often during the last year have you failed to do what was normally expected from you becasue of drinking?: Never 6. How often during the last year have you needed a first drink in the morning to get yourself going after a heavy drinking session?: Never 7. How often during the last year have you had a feeling of guilt of remorse after drinking?: Never 8. How often during the last year have you been unable to remember what happened the night before because you had been drinking?: Never 9. Have you or someone else been injured as a result of your drinking?: No 10. Has a relative or friend or a doctor or another health worker been concerned about your drinking or suggested you cut down?: No Alcohol Use Disorder Identification Test Final Score (AUDIT): 2 Substance Abuse History in the last 12 months:  Yes.   Consequences of Substance Abuse: NA Previous Psychotropic Medications: No  Psychological Evaluations: No  Past Medical History:  Past Medical History:  Diagnosis Date  . Depression   .  ETOH abuse   . Hepatitis C   . Hypertension   . Mental disorder   . Substance abuse [facility])     Past Surgical History:  Procedure Laterality Date  . CHOLECYSTECTOMY    . TUBAL LIGATION     Family History:  Family History  Problem Relation Age of Onset  . Diabetes Mother   . Hypertension Mother   . Cancer Father     Tobacco Screening:   Social  History:  Social History   Substance and Sexual Activity  Alcohol Use Yes  . Alcohol/week: 44.0 standard drinks  . Types: 24 Cans of beer, 20 Shots of liquor per week     Social History   Substance and Sexual Activity  Drug Use Yes  . Frequency: 7.0 times per week  . Types: Cocaine, Marijuana, "Crack" cocaine    Additional Social History:                           Allergies:   Allergies  Allergen Reactions  . Penicillins Hives    DID THE REACTION INVOLVE: Swelling of the face/tongue/throat, SOB, or low BP? Y Sudden or severe rash/hives, skin peeling, or the inside of the mouth or nose? n Did it require medical treatment? N When did it last happen?more than 10 yrs If all above answers are "NO", may proceed with cephalosporin use.    Lab Results:  Results for orders placed or performed during the hospital encounter of 09/02/18 (from the past 48 hour(s))  Ethanol     Status: None   Collection Time: 09/02/18 11:20 AM  Result Value Ref Range   Alcohol, Ethyl (B) <10 <10 mg/dL    Comment: (NOTE) Lowest detectable limit for serum alcohol is 10 mg/dL. For medical purposes only. Performed at [facility], [address], [place], [place] [address]   Salicylate level     Status: None   Collection Time: 09/02/18 11:20 AM  Result Value Ref Range   Salicylate Lvl <7.0 2.8 - 30.0 mg/dL    Comment: Performed at [facility], [address] [place], [place] [address]  Acetaminophen level     Status: Abnormal   Collection Time: 09/02/18 11:20 AM  Result Value Ref Range   Acetaminophen (Tylenol), Serum <10 (L) 10 - 30 ug/mL    Comment: (NOTE) Therapeutic concentrations vary significantly. A range of 10-30 ug/mL  may be an effective concentration for many patients. However, some  are best treated at concentrations outside of this range. Acetaminophen concentrations >150 ug/mL at 4 hours after ingestion  and >50 ug/mL at 12  hours after ingestion are often associated with  toxic reactions. Performed at [facility], [address] [place], [place] [address]   Rapid urine drug screen (hospital performed)     Status: Abnormal   Collection Time: 09/02/18 11:20 AM  Result Value Ref Range   Opiates NONE DETECTED NONE DETECTED   Cocaine POSITIVE (A) NONE DETECTED   Benzodiazepines NONE DETECTED NONE DETECTED   Amphetamines NONE DETECTED NONE DETECTED   Tetrahydrocannabinol POSITIVE (A) NONE DETECTED   Barbiturates NONE DETECTED NONE DETECTED    Comment: (NOTE) DRUG SCREEN FOR MEDICAL PURPOSES ONLY.  IF CONFIRMATION IS NEEDED FOR ANY PURPOSE, NOTIFY LAB WITHIN 5 DAYS. LOWEST DETECTABLE LIMITS FOR URINE DRUG SCREEN Drug Class  Cutoff (ng/mL) Amphetamine and metabolites    1000 Barbiturate and metabolites    200 Benzodiazepine                 200 Tricyclics and metabolites     300 Opiates and metabolites        300 Cocaine and metabolites        300 THC                            50 Performed at [facility], [address] [place], [place] [address]   Urinalysis, Routine w reflex microscopic     Status: Abnormal   Collection Time: 09/02/18 11:20 AM  Result Value Ref Range   Color, Urine YELLOW YELLOW   APPearance HAZY (A) CLEAR   Specific Gravity, Urine 1.018 1.005 - 1.030   pH 6.0 5.0 - 8.0   Glucose, UA NEGATIVE NEGATIVE mg/dL   Hgb urine dipstick NEGATIVE NEGATIVE   Bilirubin Urine NEGATIVE NEGATIVE   Ketones, ur NEGATIVE NEGATIVE mg/dL   Protein, ur 30 (A) NEGATIVE mg/dL   Nitrite NEGATIVE NEGATIVE   Leukocytes, UA LARGE (A) NEGATIVE   RBC / HPF 11-20 0 - 5 RBC/hpf   WBC, UA 11-20 0 - 5 WBC/hpf   Bacteria, UA NONE SEEN NONE SEEN   Squamous Epithelial / LPF 0-5 0 - 5   Mucus PRESENT    Ca Oxalate Crys, UA PRESENT     Comment: Performed at [facility], [address] [place], [place] [address]  Comprehensive  metabolic panel     Status: Abnormal   Collection Time: 09/02/18 11:30 AM  Result Value Ref Range   Sodium 140 135 - 145 mmol/L   Potassium 2.8 (L) 3.5 - 5.1 mmol/L   Chloride 104 98 - 111 mmol/L   CO2 26 22 - 32 mmol/L   Glucose, Bld 104 (H) 70 - 99 mg/dL   BUN 10 8 - 23 mg/dL   Creatinine, Ser 5.00 0.44 - 1.00 mg/dL   Calcium 9.1 8.9 - 93.8 mg/dL   Total Protein 7.7 6.5 - 8.1 g/dL   Albumin 3.8 3.5 - 5.0 g/dL   AST 25 15 - 41 U/L   ALT 11 0 - 44 U/L   Alkaline Phosphatase 63 38 - 126 U/L   Total Bilirubin 0.6 0.3 - 1.2 mg/dL   GFR calc non Af Amer >60 >60 mL/min   GFR calc Af Amer >60 >60 mL/min   Anion gap 10 5 - 15    Comment: Performed at [facility], [address], [place], [place] [address]  cbc     Status: Abnormal   Collection Time: 09/02/18 11:30 AM  Result Value Ref Range   WBC 5.8 4.0 - 10.5 K/uL   RBC 4.21 3.87 - 5.11 MIL/uL   Hemoglobin 14.1 12.0 - 15.0 g/dL   HCT 37.1 69.6 - 78.9 %   MCV 99.0 80.0 - 100.0 fL   MCH 33.5 26.0 - 34.0 pg   MCHC 33.8 30.0 - 36.0 g/dL   RDW 38.1 01.7 - 51.0 %   Platelets 128 (L) 150 - 400 K/uL   nRBC 0.0 0.0 - 0.2 %    Comment: Performed at [facility], [address], [place], [place] [address]  Wet prep, genital     Status: Abnormal   Collection Time: 09/02/18  3:37 PM  Result Value Ref Range  Yeast Wet Prep HPF POC NONE SEEN NONE SEEN   Trich, Wet Prep PRESENT (A) NONE SEEN   Clue Cells Wet Prep HPF POC PRESENT (A) NONE SEEN   WBC, Wet Prep HPF POC MANY (A) NONE SEEN   Sperm NONE SEEN     Comment: Performed at [facility], [address] [place], [place] [id number]  HIV antibody     Status: None   Collection Time: 09/02/18  6:28 PM  Result Value Ref Range   HIV Screen 4th Generation wRfx Non Reactive Non Reactive    Comment: (NOTE) Performed At: [facility] [address] [place], [place] [id number] Jolene SchimkeNagendra Sanjai MD AO:[phone]     Blood  Alcohol level:  Lab Results  Component Value Date   [facility]   ETH <5 05/26/2016    Metabolic Disorder Labs:  Lab Results  Component Value Date   HGBA1C 4.9 12/07/2016   MPG 94 12/07/2016   MPG 94 06/17/2014   No results found for: PROLACTIN Lab Results  Component Value Date   CHOL 105 06/17/2014   TRIG 73 06/17/2014   HDL 44 06/17/2014   CHOLHDL 2.4 06/17/2014   VLDL 15 06/17/2014   LDLCALC 46 06/17/2014   LDLCALC 65 12/13/2009    Current Medications: Current Facility-Administered Medications  Medication Dose Route Frequency Provider Last Rate Last Dose  . feeding supplement (ENSURE ENLIVE) (ENSURE ENLIVE) liquid 237 mL  237 mL Oral BID BM Malvin JohnsFarah, Cheyene Hamric, MD      . FLUoxetine (PROZAC) capsule 20 mg  20 mg Oral Daily Malvin JohnsFarah, Elihue Ebert, MD      . lisinopril (PRINIVIL,ZESTRIL) tablet 10 mg  10 mg Oral Daily Maryagnes AmosStarkes-Perry, Takia S, FNP   10 mg at 09/03/18 0756   And  . hydrochlorothiazide (MICROZIDE) capsule 12.5 mg  12.5 mg Oral Daily Maryagnes AmosStarkes-Perry, Takia S, FNP   12.5 mg at 09/03/18 0756  . ibuprofen (ADVIL,MOTRIN) tablet 600 mg  600 mg Oral Q8H PRN Starkes-Perry, Juel Burrowakia S, FNP      . loratadine (CLARITIN) tablet 10 mg  10 mg Oral Daily Maryagnes AmosStarkes-Perry, Takia S, FNP   10 mg at 09/03/18 0756  . multivitamin with minerals tablet 1 tablet  1 tablet Oral Daily Malvin JohnsFarah, Tatiyana Foucher, MD      . nicotine (NICODERM CQ - dosed in mg/24 hours) patch 21 mg  21 mg Transdermal Daily Maryagnes AmosStarkes-Perry, Takia S, FNP   21 mg at 09/03/18 0756  . pantoprazole (PROTONIX) EC tablet 40 mg  40 mg Oral Daily Maryagnes AmosStarkes-Perry, Takia S, FNP   40 mg at 09/03/18 0756  . potassium chloride SA (K-DUR,KLOR-CON) CR tablet 40 mEq  40 mEq Oral Once Maryagnes AmosStarkes-Perry, Takia S, FNP      . propranolol (INDERAL) tablet 10 mg  10 mg Oral Q4H PRN Malvin JohnsFarah, Rector Devonshire, MD      . traZODone (DESYREL) tablet 100 mg  100 mg Oral QHS PRN Malvin JohnsFarah, Destynee Stringfellow, MD       PTA Medications: Medications Prior to Admission  Medication Sig Dispense Refill Last  Dose  . nicotine (NICODERM CQ - DOSED IN MG/24 HOURS) 21 mg/24hr patch Place 1 patch (21 mg total) onto the skin daily. (Patient not taking: Reported on 09/02/2018) 28 patch 5 Not Taking at Unknown time  . omeprazole (PRILOSEC) 20 MG capsule TAKE 1 CAPSULE (20 MG TOTAL) BY MOUTH DAILY. (Patient not taking: Reported on 09/02/2018) 30 capsule 1 Not Taking at Unknown time  . ribavirin (COPEGUS) 200 MG tablet Take 3  tablets (600 mg total) by mouth daily. (Patient not taking: Reported on 09/02/2018) 90 tablet 2 Not Taking at Unknown time  . Sofosbuvir-Velpatasvir (EPCLUSA) 400-100 MG TABS Take 1 tablet by mouth daily with breakfast. (Patient not taking: Reported on 09/02/2018) 28 tablet 2 Not Taking at Unknown time  . traMADol (ULTRAM) 50 MG tablet Take 1 tablet (50 mg total) by mouth every 8 (eight) hours as needed. (Patient not taking: Reported on 06/06/2017) 15 tablet 0 Not Taking at Unknown time  . traZODone (DESYREL) 50 MG tablet Take 0.5 tablets (25 mg total) by mouth at bedtime as needed for sleep. (Patient not taking: Reported on 06/13/2017) 30 tablet 3 Not Taking at Unknown time    Musculoskeletal: Strength & Muscle Tone: within normal limits Gait & Station: normal Patient leans: N/A  Psychiatric Specialty Exam: Physical Exam  ROS  Blood pressure (!) 147/96, pulse (!) 54, temperature 97.7 F (36.5 C), temperature source Oral, resp. rate 16, height 5\' 6"  (1.676 m), weight 57.2 kg, last menstrual period 07/27/1997.Body mass index is 20.34 kg/m.  General Appearance: Disheveled  Eye Contact:  Minimal  Speech:  Slow  Volume:  Decreased  Mood:  Anxious and Depressed  Affect:  Blunt  Thought Process:  Goal Directed  Orientation:  Full (Time, Place, and Person)  Thought Content:  Logical  Suicidal Thoughts:  Yes.  without intent/plan  Homicidal Thoughts:  No  Memory:  Immediate;   Fair  Judgement:  Fair  Insight:  Fair  Psychomotor Activity:  Decreased  Concentration:  Concentration: Fair   Recall:  [employer]Fair  Fund of Knowledge:  Fair  Language:  Fair  Akathisia:  Negative  Handed:  Right  AIMS (if indicated):     Assets:  Leisure Time Resilience  ADL's:  Intact  Cognition:  WNL  Sleep:  Number of Hours: 5.5    Treatment Plan Summary: Daily contact with patient to assess and evaluate symptoms and progress in treatment, Medication management and Plan Offered medications for cravings but declined begin antidepressants and cognitive therapy continue current precautions  Observation Level/Precautions:  15 minute checks  Laboratory:  UDS  Psychotherapy: Rehab   Medications: Begin SRI  Consultations: Not needed  Discharge Concerns: Long-term sobriety  Estimated LOS: 3-5  Other:     Physician Treatment Plan for Primary Diagnosis: <principal problem not specified> Long Term Goal(s): Improvement in symptoms so as ready for discharge  Short Term Goals: Ability to disclose and discuss suicidal ideas, Ability to demonstrate self-control will improve and Ability to identify and develop effective coping behaviors will improve  Physician Treatment Plan for Secondary Diagnosis: Active Problems:   Cocaine abuse (HCC)   Severe major depression with psychotic features (HCC)  Long Term Goal(s): Improvement in symptoms so as ready for discharge  Short Term Goals: Ability to identify changes in lifestyle to reduce recurrence of condition will improve, Ability to verbalize feelings will improve and Ability to disclose and discuss suicidal ideas  I certify that inpatient services furnished can reasonably be expected to improve the patient's condition.    Malvin JohnsFARAH,Nyaira Hodgens, MD 1/8/202010:58 AM

## 2018-09-03 NOTE — Progress Notes (Signed)
Recreation Therapy Notes  Date: 1.8.20 Time: 1000 Location: 500 Hall Dayroom  Group Topic: Goal Setting  Goal Area(s) Addresses:  Patient will be able to identify at least 3 life goals.  Patient will be able to identify obstacles to reaching goals.  Patient will be able to identify benefit of setting life goals post d/c.   Intervention: Worksheet  Activity: Garment/textile technologist.  Patients were to set goals they want to accomplish within the next week, month, year and five years.  Patients were to also identify obstacles that would hinder reaching goals, what they need to reach goals and what they could start doing today towards their goals.  Education:  Discharge Planning, Coping Skills, Goal Planning   Education Outcome: Acknowledges Education/In Group Clarification Provided/Needs Additional Education  Clinical Observations:  Pt did not attend group.    Caroll Rancher, LRT/CTRS         Caroll Rancher A 09/03/2018 11:19 AM

## 2018-09-03 NOTE — Progress Notes (Signed)
Patient ID: Dana Mcintosh, female   DOB: [birth year], 68 y.o.   MRN: [id number]  Admission Note:  D:67 yr female who presents VC in no acute distress for the treatment of SI , SA and Depression. Pt appears flat and depressed. Pt was calm and cooperative with admission process. Pt presents with passive SI and contracts for safety upon admission. Pt denies AVH . Pt stated she  Was dealing with crack addiction . Pt stated she believes her children will be supportive , if they believe she is serious. Pt stated she would like a LT Tx facility to help her get things right. Pt very tearful on admission, pt appears to be ready to invest in Tx.   A:Skin was assessed( Emily-RN) and found to be clear of any abnormal marks apart from a abrasion R-knee . PT searched and no contraband found, POC and unit policies explained and understanding verbalized. Consents obtained. Food and fluids offered, and  accepted.   R:Pt had no additional questions or concerns.

## 2018-09-03 NOTE — BHH Group Notes (Signed)
LCSW Group Therapy Notes 09/03/2018 1:42 PM  Type of Therapy and Topic: Group Therapy: Overcoming Obstacles  Participation Level: Did Not Attend  Description of Group:  In this group patients will be encouraged to explore what they see as obstacles to their own wellness and recovery. They will be guided to discuss their thoughts, feelings, and behaviors related to these obstacles. The group will process together ways to cope with barriers, with attention given to specific choices patients can make. Each patient will be challenged to identify changes they are motivated to make in order to overcome their obstacles. This group will be process-oriented, with patients participating in exploration of their own experiences as well as giving and receiving support and challenge from other group members.  Therapeutic Goals: 1. Patient will identify personal and current obstacles as they relate to admission. 2. Patient will identify barriers that currently interfere with their wellness or overcoming obstacles.  3. Patient will identify feelings, thought process and behaviors related to these barriers. 4. Patient will identify two changes they are willing to make to overcome these obstacles:   Summary of Patient Progress Patient invited to attend, chose to rest.   Therapeutic Modalities:  Cognitive Behavioral Therapy Solution Focused Therapy Motivational Interviewing Relapse Prevention Therapy  Enid Cutter, MSW, [facility] 09/03/2018 1:42 PM

## 2018-09-03 NOTE — Tx Team (Signed)
Initial Treatment Plan 09/03/2018 12:05 AM Dana Grumblingarolyn R Orman ZOX:[id number]    PATIENT STRESSORS: Financial difficulties Marital or family conflict Substance abuse   PATIENT STRENGTHS: Ability for insight General fund of knowledge Motivation for treatment/growth Supportive family/friends   PATIENT IDENTIFIED PROBLEMS: Risk for suicide  SA (crack)  " want help with drug addiction and depression "                 DISCHARGE CRITERIA:  Adequate post-discharge living arrangements Improved stabilization in mood, thinking, and/or behavior Verbal commitment to aftercare and medication compliance  PRELIMINARY DISCHARGE PLAN: Attend aftercare/continuing care group Outpatient therapy Placement in alternative living arrangements  PATIENT/FAMILY INVOLVEMENT: This treatment plan has been presented to and reviewed with the patient, Dana Mcintosh.  The patient and family have been given the opportunity to ask questions and make suggestions.  Delos HaringPhillips, Lemoine Goyne A, RN 09/03/2018, 12:05 AM

## 2018-09-03 NOTE — Progress Notes (Signed)
Recreation Therapy Notes  INPATIENT RECREATION THERAPY ASSESSMENT  Patient Details Name: Dana Mcintosh MRN: [id number] DOB: 07/06/1951 Today's Date: 09/03/2018       Information Obtained From: Patient  Able to Participate in Assessment/Interview: Yes  Patient Presentation: Alert  Reason for Admission (Per Patient): Substance Abuse, Other (Comments)(Depression)  Patient Stressors: Family  Coping Skills:   Isolation, Self-Injury, TV, Arguments, Aggression, Substance Abuse  Leisure Interests (2+):  (Pt stated she doesn't do anything but use drugs everyday.)  Frequency of Recreation/Participation:    Awareness of Community Resources:  No  Expressed Interest in [employer] Information: No  [place] of Residence:  [place]  Patient Main Form of Transportation: Walk  Patient Strengths:  "I don't know"  Patient Identified Areas of Improvement:  "I can't think of nothing"  Patient Goal for Hospitalization:  "Get off drugs and find a better way to deal with life and a better way of living".  Current SI (including self-harm):  No  Current HI:  No  Current AVH: No  Staff Intervention Plan: Group Attendance, Collaborate with Interdisciplinary Treatment Team  Consent to Intern Participation: N/A    Caroll Rancher, LRT/CTRS  Lillia Abed, Lindie Roberson A 09/03/2018, 1:15 PM

## 2018-09-03 NOTE — Tx Team (Signed)
Interdisciplinary Treatment and Diagnostic Plan Update  09/03/2018 Time of Session: 12:00pm Dana Mcintosh MRN: [id number]  Principal Diagnosis: <principal problem not specified>  Secondary Diagnoses: Active Problems:   Cocaine abuse (HCC)   Severe major depression with psychotic features (HCC)   Current Medications:  Current Facility-Administered Medications  Medication Dose Route Frequency Provider Last Rate Last Dose  . feeding supplement (ENSURE ENLIVE) (ENSURE ENLIVE) liquid 237 mL  237 mL Oral BID BM Johnn Hai, MD      . FLUoxetine (PROZAC) capsule 20 mg  20 mg Oral Daily Johnn Hai, MD      . lisinopril (PRINIVIL,ZESTRIL) tablet 10 mg  10 mg Oral Daily Suella Broad, FNP   10 mg at 09/03/18 0756   And  . hydrochlorothiazide (MICROZIDE) capsule 12.5 mg  12.5 mg Oral Daily Suella Broad, FNP   12.5 mg at 09/03/18 0756  . ibuprofen (ADVIL,MOTRIN) tablet 600 mg  600 mg Oral Q8H PRN Starkes-Perry, Gayland Curry, FNP      . loratadine (CLARITIN) tablet 10 mg  10 mg Oral Daily Suella Broad, FNP   10 mg at 09/03/18 0756  . multivitamin with minerals tablet 1 tablet  1 tablet Oral Daily Johnn Hai, MD      . nicotine (NICODERM CQ - dosed in mg/24 hours) patch 21 mg  21 mg Transdermal Daily Suella Broad, FNP   21 mg at 09/03/18 0756  . pantoprazole (PROTONIX) EC tablet 40 mg  40 mg Oral Daily Suella Broad, FNP   40 mg at 09/03/18 0756  . potassium chloride SA (K-DUR,KLOR-CON) CR tablet 40 mEq  40 mEq Oral Once Suella Broad, FNP      . propranolol (INDERAL) tablet 10 mg  10 mg Oral Q4H PRN Johnn Hai, MD      . traZODone (DESYREL) tablet 100 mg  100 mg Oral QHS PRN Johnn Hai, MD       PTA Medications: Medications Prior to Admission  Medication Sig Dispense Refill Last Dose  . nicotine (NICODERM CQ - DOSED IN MG/24 HOURS) 21 mg/24hr patch Place 1 patch (21 mg total) onto the skin daily. (Patient not taking: Reported on  09/02/2018) 28 patch 5 Not Taking at Unknown time  . omeprazole (PRILOSEC) 20 MG capsule TAKE 1 CAPSULE (20 MG TOTAL) BY MOUTH DAILY. (Patient not taking: Reported on 09/02/2018) 30 capsule 1 Not Taking at Unknown time  . ribavirin (COPEGUS) 200 MG tablet Take 3 tablets (600 mg total) by mouth daily. (Patient not taking: Reported on 09/02/2018) 90 tablet 2 Not Taking at Unknown time  . Sofosbuvir-Velpatasvir (EPCLUSA) 400-100 MG TABS Take 1 tablet by mouth daily with breakfast. (Patient not taking: Reported on 09/02/2018) 28 tablet 2 Not Taking at Unknown time  . traMADol (ULTRAM) 50 MG tablet Take 1 tablet (50 mg total) by mouth every 8 (eight) hours as needed. (Patient not taking: Reported on 06/06/2017) 15 tablet 0 Not Taking at Unknown time  . traZODone (DESYREL) 50 MG tablet Take 0.5 tablets (25 mg total) by mouth at bedtime as needed for sleep. (Patient not taking: Reported on 06/13/2017) 30 tablet 3 Not Taking at Unknown time    Patient Stressors: Financial difficulties Marital or family conflict Substance abuse  Patient Strengths: Ability for insight General fund of knowledge Motivation for treatment/growth Supportive family/friends  Treatment Modalities: Medication Management, Group therapy, Case management,  1 to 1 session with clinician, Psychoeducation, Recreational therapy.   Physician Treatment Plan for Primary Diagnosis: <  principal problem not specified> Long Term Goal(s): Improvement in symptoms so as ready for discharge Improvement in symptoms so as ready for discharge   Short Term Goals: Ability to disclose and discuss suicidal ideas Ability to demonstrate self-control will improve Ability to identify and develop effective coping behaviors will improve Ability to identify changes in lifestyle to reduce recurrence of condition will improve Ability to verbalize feelings will improve Ability to disclose and discuss suicidal ideas  Medication Management: Evaluate patient's  response, side effects, and tolerance of medication regimen.  Therapeutic Interventions: 1 to 1 sessions, Unit Group sessions and Medication administration.  Evaluation of Outcomes: Not Met  Physician Treatment Plan for Secondary Diagnosis: Active Problems:   Cocaine abuse ([place])   Severe major depression with psychotic features ([place])  Long Term Goal(s): Improvement in symptoms so as ready for discharge Improvement in symptoms so as ready for discharge   Short Term Goals: Ability to disclose and discuss suicidal ideas Ability to demonstrate self-control will improve Ability to identify and develop effective coping behaviors will improve Ability to identify changes in lifestyle to reduce recurrence of condition will improve Ability to verbalize feelings will improve Ability to disclose and discuss suicidal ideas     Medication Management: Evaluate patient's response, side effects, and tolerance of medication regimen.  Therapeutic Interventions: 1 to 1 sessions, Unit Group sessions and Medication administration.  Evaluation of Outcomes: Not Met   RN Treatment Plan for Primary Diagnosis: <principal problem not specified> Long Term Goal(s): Knowledge of disease and therapeutic regimen to maintain health will improve  Short Term Goals: Ability to remain free from injury will improve, Ability to verbalize feelings will improve, Ability to disclose and discuss suicidal ideas, Ability to identify and develop effective coping behaviors will improve and Compliance with prescribed medications will improve  Medication Management: RN will administer medications as ordered by provider, will assess and evaluate patient's response and provide education to patient for prescribed medication. RN will report any adverse and/or side effects to prescribing provider.  Therapeutic Interventions: 1 on 1 counseling sessions, Psychoeducation, Medication administration, Evaluate responses to treatment, Monitor  vital signs and CBGs as ordered, Perform/monitor CIWA, COWS, AIMS and Fall Risk screenings as ordered, Perform wound care treatments as ordered.  Evaluation of Outcomes: Not Met   LCSW Treatment Plan for Primary Diagnosis: <principal problem not specified> Long Term Goal(s): Safe transition to appropriate next level of care at discharge, Engage patient in therapeutic group addressing interpersonal concerns.  Short Term Goals: Engage patient in aftercare planning with referrals and resources, Increase social support, Increase emotional regulation, Facilitate patient progression through stages of change regarding substance use diagnoses and concerns, Identify triggers associated with mental health/substance abuse issues and Increase skills for wellness and recovery  Therapeutic Interventions: Assess for all discharge needs, 1 to 1 time with Social worker, Explore available resources and support systems, Assess for adequacy in community support network, Educate family and significant other(s) on suicide prevention, Complete Psychosocial Assessment, Interpersonal group therapy.  Evaluation of Outcomes: Not Met   Progress in Treatment: Attending groups: No. Participating in groups: No. Taking medication as prescribed: Yes. Toleration medication: Yes. Family/Significant other contact made: No, will contact:  supports if consent is granted Patient understands diagnosis: Yes. Discussing patient identified problems/goals with staff: No. Medical problems stabilized or resolved: No. Denies suicidal/homicidal ideation: No. Issues/concerns per patient self-inventory: No.  New problem(s) identified: Yes, Describe:  patient may need shelter and housing resources upon discharge  New Short Term/Long Term Goal(s):  detox, medication management for mood stabilization; elimination of SI thoughts; development of comprehensive mental wellness/sobriety plan.  Patient Goals:  "I want help with drug addiction  and depression."  Discharge Plan or Barriers: CSW continuing to assess. [place] pamphlet, Mobile Crisis information, and AA/NA information provided to patient for additional community support and resources.   Reason for Continuation of Hospitalization: Anxiety Depression Medication stabilization Suicidal ideation Withdrawal symptoms  Estimated Length of Stay: 3-5 days  Attendees: Patient: 09/03/2018 12:18 PM  Physician:  09/03/2018 12:18 PM  Nursing:  09/03/2018 12:18 PM  RN Care Manager: 09/03/2018 12:18 PM  Social Worker: Stephanie Acre, [place] 09/03/2018 12:18 PM  Recreational Therapist:  09/03/2018 12:18 PM  Other:  09/03/2018 12:18 PM  Other:  09/03/2018 12:18 PM  Other: 09/03/2018 12:18 PM    Scribe for Treatment Team: Joellen Jersey, [place] 09/03/2018 12:18 PM

## 2018-09-03 NOTE — [facility] Suicide Risk Assessment (Signed)
[facility] INPATIENT:  Family/Significant Other Suicide Prevention Education  Suicide Prevention Education:  Patient Refusal for Family/Significant Other Suicide Prevention Education: The patient Dana Mcintosh has refused to provide written consent for family/significant other to be provided Family/Significant Other Suicide Prevention Education during admission and/or prior to discharge.  Physician notified.  Darreld McleanCharlotte C Lauriann Milillo 09/03/2018, 3:57 PM
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
## 2018-09-03 NOTE — Progress Notes (Signed)
Psychoeducational Group Note  Date:  09/03/2018 Time:  2053  Group Topic/Focus:  Wrap-Up Group:   The focus of this group is to help patients review their daily goal of treatment and discuss progress on daily workbooks.  Participation Level: Did Not Attend  Participation Quality:  Not Applicable  Affect:  Not Applicable  Cognitive:  Not Applicable  Insight:  Not Applicable  Engagement in Group: Not Applicable  Additional Comments:  The patient did not attend group since she was asleep in her bedroom.  Hazle Coca S 09/03/2018, 8:53 PM

## 2018-09-04 NOTE — Progress Notes (Signed)
[facility] MD Progress Note  09/04/2018 9:27 AM Dana Mcintosh  MRN:  [id number] Subjective:    Patient generally stable but has no housing she is going to be referred 8 active this is not forthcoming as far as a referral tomorrow she will go to [facility] Principal Problem: Depression substance abuse homelessness Diagnosis: Active Problems:   Cocaine abuse (HCC)   Severe major depression with psychotic features (HCC)  Total Time spent with patient: 30 minutes  Past Medical History:  Past Medical History:  Diagnosis Date  . Depression   . ETOH abuse   . Hepatitis C   . Hypertension   . Mental disorder   . Substance abuse [facility])     Past Surgical History:  Procedure Laterality Date  . CHOLECYSTECTOMY    . TUBAL LIGATION     Family History:  Family History  Problem Relation Age of Onset  . Diabetes Mother   . Hypertension Mother   . Cancer Father    Family Psychiatric  History: no new data Social History:  Social History   Substance and Sexual Activity  Alcohol Use Yes  . Alcohol/week: 44.0 standard drinks  . Types: 24 Cans of beer, 20 Shots of liquor per week     Social History   Substance and Sexual Activity  Drug Use Yes  . Frequency: 7.0 times per week  . Types: Cocaine, Marijuana, "Crack" cocaine    Social History   Socioeconomic History  . Marital status: Divorced    Spouse name: Not on file  . Number of children: Not on file  . Years of education: Not on file  . Highest education level: Not on file  Occupational History  . Not on file  Social Needs  . Financial resource strain: Not on file  . Food insecurity:    Worry: Not on file    Inability: Not on file  . Transportation needs:    Medical: Not on file    Non-medical: Not on file  Tobacco Use  . Smoking status: Current Every Day Smoker    Packs/day: 0.50    Years: 40.00    Pack years: 20.00    Types: Cigarettes  . Smokeless tobacco: Never Used  . Tobacco comment: on nicotene patch   Substance and Sexual Activity  . Alcohol use: Yes    Alcohol/week: 44.0 standard drinks    Types: 24 Cans of beer, 20 Shots of liquor per week  . Drug use: Yes    Frequency: 7.0 times per week    Types: Cocaine, Marijuana, "Crack" cocaine  . Sexual activity: Never  Lifestyle  . Physical activity:    Days per week: Not on file    Minutes per session: Not on file  . Stress: Not on file  Relationships  . Social connections:    Talks on phone: Not on file    Gets together: Not on file    Attends religious service: Not on file    Active member of club or organization: Not on file    Attends meetings of clubs or organizations: Not on file    Relationship status: Not on file  Other Topics Concern  . Not on file  Social History Narrative  . Not on file   Additional Social History:                         Sleep: Good  Appetite:  Good  Current Medications: Current Facility-Administered  Medications  Medication Dose Route Frequency Provider Last Rate Last Dose  . feeding supplement (ENSURE ENLIVE) (ENSURE ENLIVE) liquid 237 mL  237 mL Oral BID BM Malvin JohnsFarah, Sherrel Ploch, MD      . FLUoxetine (PROZAC) capsule 20 mg  20 mg Oral Daily Malvin JohnsFarah, Emir Nack, MD   20 mg at 09/04/18 0815  . lisinopril (PRINIVIL,ZESTRIL) tablet 10 mg  10 mg Oral Daily Maryagnes AmosStarkes-Perry, Takia S, FNP   10 mg at 09/04/18 0815   And  . hydrochlorothiazide (MICROZIDE) capsule 12.5 mg  12.5 mg Oral Daily Maryagnes AmosStarkes-Perry, Takia S, FNP   12.5 mg at 09/04/18 0815  . ibuprofen (ADVIL,MOTRIN) tablet 600 mg  600 mg Oral Q8H PRN Starkes-Perry, Juel Burrowakia S, FNP      . loratadine (CLARITIN) tablet 10 mg  10 mg Oral Daily Maryagnes AmosStarkes-Perry, Takia S, FNP   10 mg at 09/04/18 0815  . multivitamin with minerals tablet 1 tablet  1 tablet Oral Daily Malvin JohnsFarah, Brit Carbonell, MD   1 tablet at 09/04/18 0815  . nicotine (NICODERM CQ - dosed in mg/24 hours) patch 21 mg  21 mg Transdermal Daily Maryagnes AmosStarkes-Perry, Takia S, FNP   21 mg at 09/04/18 0813  . pantoprazole  (PROTONIX) EC tablet 40 mg  40 mg Oral Daily Maryagnes AmosStarkes-Perry, Takia S, FNP   40 mg at 09/04/18 0815  . propranolol (INDERAL) tablet 10 mg  10 mg Oral Q4H PRN Malvin JohnsFarah, Mandisa Persinger, MD      . traZODone (DESYREL) tablet 100 mg  100 mg Oral QHS PRN Malvin JohnsFarah, Mikeal Winstanley, MD   100 mg at 09/03/18 2107    Lab Results:  Results for orders placed or performed during the hospital encounter of 09/02/18 (from the past 48 hour(s))  Ethanol     Status: None   Collection Time: 09/02/18 11:20 AM  Result Value Ref Range   Alcohol, Ethyl (B) <10 <10 mg/dL    Comment: (NOTE) Lowest detectable limit for serum alcohol is 10 mg/dL. For medical purposes only. Performed at [facility], [address] [place], [place] [address]   Salicylate level     Status: None   Collection Time: 09/02/18 11:20 AM  Result Value Ref Range   Salicylate Lvl <7.0 2.8 - 30.0 mg/dL    Comment: Performed at [facility], [address] [place], [place] [address]  Acetaminophen level     Status: Abnormal   Collection Time: 09/02/18 11:20 AM  Result Value Ref Range   Acetaminophen (Tylenol), Serum <10 (L) 10 - 30 ug/mL    Comment: (NOTE) Therapeutic concentrations vary significantly. A range of 10-30 ug/mL  may be an effective concentration for many patients. However, some  are best treated at concentrations outside of this range. Acetaminophen concentrations >150 ug/mL at 4 hours after ingestion  and >50 ug/mL at 12 hours after ingestion are often associated with  toxic reactions. Performed at [facility], [address] [place], [place] [address]   Rapid urine drug screen (hospital performed)     Status: Abnormal   Collection Time: 09/02/18 11:20 AM  Result Value Ref Range   Opiates NONE DETECTED NONE DETECTED   Cocaine POSITIVE (A) NONE DETECTED   Benzodiazepines NONE DETECTED NONE DETECTED   Amphetamines NONE DETECTED NONE DETECTED   Tetrahydrocannabinol POSITIVE (A) NONE  DETECTED   Barbiturates NONE DETECTED NONE DETECTED    Comment: (NOTE) DRUG SCREEN FOR MEDICAL PURPOSES ONLY.  IF CONFIRMATION IS NEEDED FOR ANY PURPOSE, NOTIFY LAB WITHIN 5 DAYS. LOWEST DETECTABLE  LIMITS FOR URINE DRUG SCREEN Drug Class                     Cutoff (ng/mL) Amphetamine and metabolites    1000 Barbiturate and metabolites    200 Benzodiazepine                 200 Tricyclics and metabolites     300 Opiates and metabolites        300 Cocaine and metabolites        300 THC                            50 Performed at [facility], [address] [place], [place] [address]   Urinalysis, Routine w reflex microscopic     Status: Abnormal   Collection Time: 09/02/18 11:20 AM  Result Value Ref Range   Color, Urine YELLOW YELLOW   APPearance HAZY (A) CLEAR   Specific Gravity, Urine 1.018 1.005 - 1.030   pH 6.0 5.0 - 8.0   Glucose, UA NEGATIVE NEGATIVE mg/dL   Hgb urine dipstick NEGATIVE NEGATIVE   Bilirubin Urine NEGATIVE NEGATIVE   Ketones, ur NEGATIVE NEGATIVE mg/dL   Protein, ur 30 (A) NEGATIVE mg/dL   Nitrite NEGATIVE NEGATIVE   Leukocytes, UA LARGE (A) NEGATIVE   RBC / HPF 11-20 0 - 5 RBC/hpf   WBC, UA 11-20 0 - 5 WBC/hpf   Bacteria, UA NONE SEEN NONE SEEN   Squamous Epithelial / LPF 0-5 0 - 5   Mucus PRESENT    Ca Oxalate Crys, UA PRESENT     Comment: Performed at [facility], [address], [place], [place] [address]  Comprehensive metabolic panel     Status: Abnormal   Collection Time: 09/02/18 11:30 AM  Result Value Ref Range   Sodium 140 135 - 145 mmol/L   Potassium 2.8 (L) 3.5 - 5.1 mmol/L   Chloride 104 98 - 111 mmol/L   CO2 26 22 - 32 mmol/L   Glucose, Bld 104 (H) 70 - 99 mg/dL   BUN 10 8 - 23 mg/dL   Creatinine, Ser 0.98 0.44 - 1.00 mg/dL   Calcium 9.1 8.9 - 11.9 mg/dL   Total Protein 7.7 6.5 - 8.1 g/dL   Albumin 3.8 3.5 - 5.0 g/dL   AST 25 15 - 41 U/L   ALT 11 0 - 44 U/L   Alkaline Phosphatase 63 38 -  126 U/L   Total Bilirubin 0.6 0.3 - 1.2 mg/dL   GFR calc non Af Amer >60 >60 mL/min   GFR calc Af Amer >60 >60 mL/min   Anion gap 10 5 - 15    Comment: Performed at [facility], [address], [place], [place] [address]  cbc     Status: Abnormal   Collection Time: 09/02/18 11:30 AM  Result Value Ref Range   WBC 5.8 4.0 - 10.5 K/uL   RBC 4.21 3.87 - 5.11 MIL/uL   Hemoglobin 14.1 12.0 - 15.0 g/dL   HCT 95.6 21.3 - 08.6 %   MCV 99.0 80.0 - 100.0 fL   MCH 33.5 26.0 - 34.0 pg   MCHC 33.8 30.0 - 36.0 g/dL   RDW 57.8 46.9 - 62.9 %   Platelets 128 (L) 150 - 400 K/uL   nRBC 0.0 0.0 - 0.2 %    Comment: Performed at [facility], [address] [place], [place] [address]  Wet prep, genital     Status: Abnormal   Collection Time: 09/02/18  3:37 PM  Result Value Ref Range   Yeast Wet Prep HPF POC NONE SEEN NONE SEEN   Trich, Wet Prep PRESENT (A) NONE SEEN   Clue Cells Wet Prep HPF POC PRESENT (A) NONE SEEN   WBC, Wet Prep HPF POC MANY (A) NONE SEEN   Sperm NONE SEEN     Comment: Performed at [facility], [address], [place], [place] [address]  HIV antibody     Status: None   Collection Time: 09/02/18  6:28 PM  Result Value Ref Range   HIV Screen 4th Generation wRfx Non Reactive Non Reactive    Comment: (NOTE) Performed At: [facility] [address] [place], [place] [id number] Jolene Schimke MD [id number]   RPR     Status: Abnormal   Collection Time: 09/02/18  6:28 PM  Result Value Ref Range   RPR Ser Ql Reactive (A) Non Reactive    Comment: (NOTE) Performed At: [facility] [address] [place], [place] [address] Jolene Schimke MD [id number]   RPR, quant & T.pallidum antibodies     Status: Abnormal   Collection Time: 09/02/18  6:28 PM  Result Value Ref Range   Rapid Plasma Reagin, Quant 1:1 (H) NonRea<1:1   T Pallidum Abs Reactive (A) Non Reactive    Comment: (NOTE) Performed At: [facility] [address] [place], [place] [id number] Jolene Schimke MD ZL:[id number]     Blood Alcohol level:  Lab Results  Component Value Date   [facility] <10 09/02/2018   ETH <5 05/26/2016    Metabolic Disorder Labs: Lab Results  Component Value Date   HGBA1C 4.9 12/07/2016   MPG 94 12/07/2016   MPG 94 06/17/2014   No results found for: PROLACTIN Lab Results  Component Value Date   CHOL 105 06/17/2014   TRIG 73 06/17/2014   HDL 44 06/17/2014   CHOLHDL 2.4 06/17/2014   VLDL 15 06/17/2014   LDLCALC 46 06/17/2014   LDLCALC 65 12/13/2009    Physical Findings: AIMS: Facial and Oral Movements Muscles of Facial Expression: None, normal Lips and Perioral Area: None, normal Jaw: None, normal Tongue: None, normal,Extremity Movements Upper (arms, wrists, hands, fingers): None, normal Lower (legs, knees, ankles, toes): None, normal, Trunk Movements Neck, shoulders, hips: None, normal, Overall Severity Severity of abnormal movements (highest score from questions above): None, normal Incapacitation due to abnormal movements: None, normal Patient's awareness of abnormal movements (rate only patient's report): No Awareness, Dental Status Current problems with teeth and/or dentures?: Yes(missing) Does patient usually wear dentures?: No  CIWA:    COWS:     Musculoskeletal: Strength & Muscle Tone: within normal limits Gait & Station: normal Patient leans: N/A  Psychiatric Specialty Exam: Physical Exam  ROS  Blood pressure 101/80, pulse 73, temperature 98.2 F (36.8 C), temperature source Oral, resp. rate 17, height 5\' 6"  (1.676 m), weight 57.2 kg, last menstrual period 07/27/1997.Body mass index is 20.34 kg/m.  General Appearance: Casual  Eye Contact:  Good  Speech:  Clear and Coherent  Volume:  Decreased  Mood:  Dysphoric  Affect:  Appropriate  Thought Process:  Coherent  Orientation:  Full (Time, Place, and Person)  Thought Content:  Tangential  Suicidal  Thoughts:  No  Homicidal Thoughts:  No  Memory:  Immediate;   Fair  Judgement:  Fair  Insight:  Fair  Psychomotor Activity:  Normal  Concentration:  Concentration: Good  Recall:  Good  Fund of Knowledge:  Good  Language:  Good  Akathisia:  Negative  Handed:  Right  AIMS (if indicated):     Assets:  Communication Skills Desire for Improvement  ADL's:  Intact  Cognition:  WNL  Sleep:  Number of Hours: 6.75     Treatment Plan Summary: Daily contact with patient to assess and evaluate symptoms and progress in treatment, Medication management and Plan We will monitor 1 more day no change in meds or precautions  Yaiden Yang, MD 09/04/2018, 9:27 AM

## 2018-09-04 NOTE — Progress Notes (Signed)
Nursing Progress Note: 7p-7a D: Pt currently presents with a pleasant/anxious/euthymic affect and behavior. Pt states "I want to be better and stop doing drugs. I believe I can. I just have to stay away from negative influences." Interacting appropriately with the milieu. Pt reports good sleep during the previous night with current medication regimen. Pt did attend wrap-up group.  A: Pt provided with medications per providers orders. Pt's labs and vitals were monitored throughout the night. Pt supported emotionally and encouraged to express concerns and questions. Pt educated on medications.  R: Pt's safety ensured with 15 minute and environmental checks. Pt currently denies SI, HI, and AH and endorses VH. Pt verbally contracts to seek staff if SI,HI, or AVH occurs and to consult with staff before acting on any harmful thoughts. Will continue to monitor.

## 2018-09-04 NOTE — Progress Notes (Signed)
Adult Psychoeducational Group Note  Date:  09/04/2018 Time:  8:45 PM  Group Topic/Focus:  Wrap-Up Group:   The focus of this group is to help patients review their daily goal of treatment and discuss progress on daily workbooks.  Participation Level:  Active  Participation Quality:  Appropriate  Affect:  Appropriate  Cognitive:  Appropriate  Insight: Appropriate  Engagement in Group:  Engaged  Modes of Intervention:  Discussion  Additional Comments:  The patient expressed that she had a rough day.  Octavio Manns 09/04/2018, 8:45 PM

## 2018-09-04 NOTE — Progress Notes (Signed)
Recreation Therapy Notes  Date: 1.9.20 Time: 1000 Location:  [address] Dayroom  Group Topic: Communication, Team Building, Problem Solving  Goal Area(s) Addresses:  Patient will effectively work with peer towards shared goal.  Patient will identify skills used to make activity successful.  Patient will identify how skills used during activity can be used to reach post d/c goals.   Intervention: STEM Activity  Activity: Landing Pad. In teams patients were given 12 plastic drinking straws and a length of masking tape. Using the materials provided patients were asked to build a landing pad to catch a golf ball dropped from approximately 6 feet in the air.   Education: Social Skills, Discharge Planning   Education Outcome: Acknowledges education/In group clarification offered/Needs additional education.   Clinical Observations/Feedback: Pt did not attend group.     Carolena Fairbank,  LRT/CTRS         Nahomy Limburg A 09/04/2018 12:22 PM 

## 2018-09-04 NOTE — Progress Notes (Signed)
Patient ID: Dana Mcintosh, female   DOB: 23-Jan-1951, 68 y.o.   MRN: [id number] Per State regulations 482.30 this chart was reviewed for medical necessity with respect to the patient's admission/duration of stay.    Next review date: 09/06/2018  Thurman Coyer, BSN, RN-BC  Case Manager

## 2018-09-04 NOTE — Progress Notes (Signed)
CSW spoke with patient regarding discharge; patient's goal is to get into long term residential treatment. A referral to [facility] was faxed and received but has not been reviewed at this time. Patient will discharge tomorrow; patient voiced understanding that she will not be guaranteed admission to [facility] and is expected to discharge tomorrow.   CSW and patient agreed upon [facility] with plan [facility] back up. CSW to follow up with [facility] referral today.  Enid Cutterharlotte Evana Runnels, LCSW-A Clinical Social Worker

## 2018-09-04 NOTE — Progress Notes (Signed)
[facility] Group Therapy 09/04/2018 2:10 PM  Type of Therapy: Mental Health Association Presentation  Participation Level: Active  Participation Quality: Attentive  Affect: Appropriate  Cognitive: Oriented  Insight: Developing/Improving  Engagement in Therapy: Engaged  Modes of Intervention: Discussion, Education and Socialization   Summary of Progress/Problems: Mental Health Association (MHA) Speaker came to talk about his personal journey with mental health. The pt processed ways by which to relate to the speaker. MHA speaker provided handouts and educational information pertaining to groups and services offered by the [facility]. Pt was engaged in speaker's presentation and was receptive to resources provided.   Enid Cutterharlotte Sammie Schermerhorn, MSW, LCSWA 09/04/2018 2:10 PM

## 2018-09-04 NOTE — [facility] Suicide Risk Assessment (Signed)
[facility] Discharge Suicide Risk Assessment   Principal Problem: Substance abuse/issues of secondary gain/depressive symptoms reported Discharge Diagnoses: Active Problems:   Cocaine abuse (HCC)   Severe major depression with psychotic features (HCC)   Total Time spent with patient: 30 minutes  Alert and oriented and cooperative except that she will have to stay at the shelter denies wanting to harm self or others acknowledges issues of secondary gain prompting admission, no cravings tremors or withdrawal symptoms does not crave cocaine Mental Status Per Nursing Assessment::   On Admission:  Suicidal ideation indicated by patient  Demographic Factors:  Low socioeconomic status  Loss Factors: Decrease in vocational status  Historical Factors: NA  Risk Reduction Factors:   Religious beliefs about death  Continued Clinical Symptoms:  Alcohol/Substance Abuse/Dependencies  Cognitive Features That Contribute To Risk:  Closed-mindedness    Suicide Risk:  Minimal: No identifiable suicidal ideation.  Patients presenting with no risk factors but with morbid ruminations; may be classified as minimal risk based on the severity of the depressive symptoms  Follow-up Information    [facility] Follow up.   Contact information: [address] [place] [address] [phone]           Plan Of Care/Follow-up recommendations:  Activity:  full  Dana Swopes, MD 09/04/2018, 8:47 AM

## 2018-09-05 MED ORDER — RIBAVIRIN 200 MG PO TABS: 600.0000 mg | ORAL_TABLET | Freq: Every day | ORAL | 2 refills | Status: DC

## 2018-09-05 MED ORDER — NICOTINE 21 MG/24HR TD PT24: 21.0000 mg | MEDICATED_PATCH | Freq: Every day | TRANSDERMAL | 5 refills | Status: AC

## 2018-09-05 MED ORDER — TRAZODONE HCL 100 MG PO TABS: 100.0000 mg | ORAL_TABLET | Freq: Every evening | ORAL | 0 refills | Status: AC | PRN

## 2018-09-05 MED ORDER — LISINOPRIL 10 MG PO TABS: 10.0000 mg | ORAL_TABLET | Freq: Every day | ORAL | 0 refills | Status: AC

## 2018-09-05 MED ORDER — HYDROCHLOROTHIAZIDE 12.5 MG PO CAPS: 12.5000 mg | ORAL_CAPSULE | Freq: Every day | ORAL | 0 refills | Status: AC

## 2018-09-05 MED ORDER — FLUOXETINE HCL 20 MG PO CAPS: 20.0000 mg | ORAL_CAPSULE | Freq: Every day | ORAL | 0 refills | Status: AC

## 2018-09-05 MED ORDER — SOFOSBUVIR-VELPATASVIR 400-100 MG PO TABS: 1.0000 | ORAL_TABLET | Freq: Every day | ORAL | 0 refills | Status: DC

## 2018-09-05 MED ORDER — OMEPRAZOLE 20 MG PO CPDR: 20.0000 mg | DELAYED_RELEASE_CAPSULE | Freq: Every day | ORAL | 0 refills | Status: AC

## 2018-09-05 MED ORDER — CETIRIZINE HCL 10 MG PO TABS: 10.0000 mg | ORAL_TABLET | Freq: Every day | ORAL | 11 refills | Status: AC

## 2018-09-05 NOTE — Progress Notes (Signed)
  [facility] Adult Case Management Discharge Plan :  Will you be returning to the same living situation after discharge:  No. [facility]. At discharge, do you have transportation home?: Yes,  bus passes Do you have the ability to pay for your medications: Yes,  Medicare  Release of information consent forms completed and in the chart; bus passes, maps, and transportation instructions on chart.  Patient to Follow up at: Follow-up Information    [facility] Follow up.   Contact information: [address] [place] [address] [phone]        [facility] Follow up.   Why:  Walk in hours 8:30am-3:00pm, Monday through Friday Contact information: [address]  [place], [place] [address]  Phone: [phone]          Next level of care provider has access to [facility] Link: No  Safety Planning and Suicide Prevention discussed: Yes,  with patient  Has patient been referred to the Quitline?: Patient refused referral  Patient has been referred for addiction treatment: Yes  Darreld Mclean, LCSWA 09/05/2018, 10:10 AM

## 2018-09-05 NOTE — Discharge Summary (Signed)
Physician Discharge Summary Note  Patient:  Dana Mcintosh is an 68 y.o., female MRN:  [id number] DOB:  11/09/1950  Patient phone:  [phone] (home)   Patient address:   [address] [place] [address],   Total Time spent with patient: Greater than 30 minutes  Date of Admission:  09/02/2018  Date of Discharge: 09-05-18  Reason for Admission: Suicidal thoughts and plans.  Principal Problem: Severe major depression with psychotic features Dana Mcintosh(HCC)  Discharge Diagnoses: Principal Problem:   Severe major depression with psychotic features (HCC) Active Problems:   Cocaine abuse (HCC)  Past Psychiatric History: Severe Mdd with psychosis,   Past Medical History:  Past Medical History:  Diagnosis Date  . Depression   . ETOH abuse   . Hepatitis C   . Hypertension   . Mental disorder   . Substance abuse [facility](HCC)     Past Surgical History:  Procedure Laterality Date  . CHOLECYSTECTOMY    . TUBAL LIGATION     Family History:  Family History  Problem Relation Age of Onset  . Diabetes Mother   . Hypertension Mother   . Cancer Father    Family Psychiatric  History: See H&P  Social History:  Social History   Substance and Sexual Activity  Alcohol Use Yes  . Alcohol/week: 44.0 standard drinks  . Types: 24 Cans of beer, 20 Shots of liquor per week     Social History   Substance and Sexual Activity  Drug Use Yes  . Frequency: 7.0 times per week  . Types: Cocaine, Marijuana, "Crack" cocaine    Social History   Socioeconomic History  . Marital status: Divorced    Spouse name: Not on file  . Number of children: Not on file  . Years of education: Not on file  . Highest education level: Not on file  Occupational History  . Not on file  Social Needs  . Financial resource strain: Not on file  . Food insecurity:    Worry: Not on file    Inability: Not on file  . Transportation needs:    Medical: Not on file    Non-medical: Not on file  Tobacco Use  .  Smoking status: Current Every Day Smoker    Packs/day: 0.50    Years: 40.00    Pack years: 20.00    Types: Cigarettes  . Smokeless tobacco: Never Used  . Tobacco comment: on nicotene patch  Substance and Sexual Activity  . Alcohol use: Yes    Alcohol/week: 44.0 standard drinks    Types: 24 Cans of beer, 20 Shots of liquor per week  . Drug use: Yes    Frequency: 7.0 times per week    Types: Cocaine, Marijuana, "Crack" cocaine  . Sexual activity: Never  Lifestyle  . Physical activity:    Days per week: Not on file    Minutes per session: Not on file  . Stress: Not on file  Relationships  . Social connections:    Talks on phone: Not on file    Gets together: Not on file    Attends religious service: Not on file    Active member of club or organization: Not on file    Attends meetings of clubs or organizations: Not on file    Relationship status: Not on file  Other Topics Concern  . Not on file  Social History Narrative  . Not on file   Mcintosh Course: (Per Md's admission evaluation): This is a  repeat admission for this 68 year old patient currently homeless but reporting in vague terms she may have a place to stay upon discharge but would prefer rehab, at any rate she is a chronic history of cocaine abuse, cannabis abuse, alcohol abuse and presents reporting suicidal thoughts and plans.  She is admitted voluntarily.Patient is a longstanding dependency to crack cocaine since age 29 longest period of sobriety though was 14 years when she was incarcerated, presenting this time with a drug screen positive for cocaine and cannabis but a negligible alcohol level. She did endorse suicidal thoughts on presentation with thoughts of walking into traffic, she acknowledges past treatment with trazodone and Seroquel she also acknowledged having hallucinations when under the influence of crack cocaine but not at baseline. At the present time can contract for safety here is generally cooperative in  the interview process denies cravings. His current psychotic symptoms, no EPS or TD.  Dana Mcintosh is well known on this unit from previous hospitalizations. She has hx of polysubstance use disorders & substance induced mood disorders. She was re-admitted to [facility] this time with complaints of suicidal ideations with plans. Her UDS upon admission was positive for cocaine & THC. She was seeking mood stabilization treatments.  During the course of this hospitalization, Dana Mcintosh was medicated & discharged on, Fluoxetine 10 mg for depression, Nicotine patch 21 mg for nicotine withdrawal & Trazodone 100 mg prn for insomnia. She was enrolled & participated in the group counseling sessions being offered & held on this unit. She was counseled & learned coping skills that should help her cope better & maintain mood stability after discharge. She was resumed on all her other pertinent home medications for the other previously existing medical issues presented. She tolerated her treatment regimen without any adverse effects reported.   While her treatment was on going, Dana Mcintosh's improvement was monitored by observation & her daily reports of symptom reduction noted.  Her emotional & mental status were monitored by daily self-inventory reports completed by her & the clinical staff. She was evaluated daily by the treatment team for mood stability & the need for continued recovery after discharge. She was offered further treatment option upon discharge & will follow up with the outpatient psychiatric services as listed below.     Upon discharge, Dana Mcintosh was both mentally & medically stable. She currently denies any suicidal, homicidal ideations, auditory, visual/tactile hallucinations, delusional thoughts & or paranoia. She was provided with a 7 days worth, supply samples of her [facility] discharge medications. She was able to engage in safety planning including plan to return to [facility] or contact emergency services if she feels unable to  maintain her own safety or the safety of others. Pt had no further questions, comments or concerns. She left [place] Mcintosh with all personal belongings in no apparent distress.   Physical Findings: AIMS: Facial and Oral Movements Muscles of Facial Expression: None, normal Lips and Perioral Area: None, normal Jaw: None, normal Tongue: None, normal,Extremity Movements Upper (arms, wrists, hands, fingers): None, normal Lower (legs, knees, ankles, toes): None, normal, Trunk Movements Neck, shoulders, hips: None, normal, Overall Severity Severity of abnormal movements (highest score from questions above): None, normal Incapacitation due to abnormal movements: None, normal Patient's awareness of abnormal movements (rate only patient's report): No Awareness, Dental Status Current problems with teeth and/or dentures?: Yes Does patient usually wear dentures?: No  CIWA:    COWS:     Musculoskeletal: Strength & Muscle Tone: within normal limits Gait & Station: normal Patient leans: N/A  Psychiatric Specialty Exam: Physical Exam  Nursing note and vitals reviewed. Constitutional: She appears well-developed.  HENT:  Head: Normocephalic.  Eyes: Pupils are equal, round, and reactive to light.  Neck: Normal range of motion.  Cardiovascular: Normal rate.  Respiratory: Effort normal.  GI: Soft.  Genitourinary:    Genitourinary Comments: Deferred   Musculoskeletal: Normal range of motion.  Neurological: She is alert.  Skin: Skin is warm.    Review of Systems  Constitutional: Negative.   HENT: Negative.   Eyes: Negative.   Respiratory: Negative.  Negative for cough and shortness of breath.   Cardiovascular: Negative.  Negative for chest pain and palpitations.  Gastrointestinal: Negative.  Negative for abdominal pain, heartburn, nausea and vomiting.  Genitourinary: Negative.   Musculoskeletal: Negative.   Skin: Negative.   Neurological: Negative.  Negative for dizziness and headaches.   Endo/Heme/Allergies: Negative.   Psychiatric/Behavioral: Positive for depression (Stable), hallucinations (Hx. psychosis (stable)) and substance abuse (Hx. Cocaine & THC use disorders). Negative for memory loss and suicidal ideas. The patient has insomnia (Stable). The patient is not nervous/anxious (Stable).     Blood pressure 108/64, pulse 81, temperature 98.2 F (36.8 C), temperature source Oral, resp. rate 17, height 5\' 6"  (1.676 m), weight 57.2 kg, last menstrual period 07/27/1997.Body mass index is 20.34 kg/m.  See Md's discharge SRA   Has this patient used any form of tobacco in the last 30 days? (Cigarettes, Smokeless Tobacco, Cigars, and/or Pipes): Yes, an FDA-approved tobacco cessation medication was offered at discharge.  Blood Alcohol level:  Lab Results  Component Value Date   ETH <10 09/02/2018   ETH <5 05/26/2016    Metabolic Disorder Labs:  Lab Results  Component Value Date   HGBA1C 4.9 12/07/2016   MPG 94 12/07/2016   MPG 94 06/17/2014   No results found for: PROLACTIN Lab Results  Component Value Date   CHOL 105 06/17/2014   TRIG 73 06/17/2014   HDL 44 06/17/2014   CHOLHDL 2.4 06/17/2014   VLDL 15 06/17/2014   LDLCALC 46 06/17/2014   LDLCALC 65 12/13/2009   See Psychiatric Specialty Exam and Suicide Risk Assessment completed by Attending Physician prior to discharge.  Discharge destination:  Home  Is patient on multiple antipsychotic therapies at discharge:  No   Has Patient had three or more failed trials of antipsychotic monotherapy by history:  No  Recommended Plan for Multiple Antipsychotic Therapies: NA  Allergies as of 09/05/2018      Reactions   Penicillins Hives   DID THE REACTION INVOLVE: Swelling of the face/tongue/throat, SOB, or low BP? Y Sudden or severe rash/hives, skin peeling, or the inside of the mouth or nose? n Did it require medical treatment? N When did it last happen?more than 10 yrs If all above answers are "NO", may  proceed with cephalosporin use.      Medication List    STOP taking these medications   ribavirin 200 MG tablet Commonly known as:  COPEGUS   Sofosbuvir-Velpatasvir 400-100 MG Tabs Commonly known as:  EPCLUSA   traMADol 50 MG tablet Commonly known as:  ULTRAM     TAKE these medications     Indication  cetirizine 10 MG tablet Commonly known as:  ZYRTEC Take 1 tablet (10 mg total) by mouth daily. (May buy from over the counter): For allergies.  Indication:  Perennial Allergic Rhinitis, Hayfever   FLUoxetine 20 MG capsule Commonly known as:  PROZAC Take 1 capsule (20 mg total) by mouth daily. For  depression Start taking on:  September 06, 2018  Indication:  Major Depressive Disorder   hydrochlorothiazide 12.5 MG capsule Commonly known as:  MICROZIDE Take 1 capsule (12.5 mg total) by mouth daily. For high blood pressure Start taking on:  September 06, 2018  Indication:  High Blood Pressure Disorder   lisinopril 10 MG tablet Commonly known as:  PRINIVIL,ZESTRIL Take 1 tablet (10 mg total) by mouth daily. For high blood pressure Start taking on:  September 06, 2018  Indication:  High Blood Pressure Disorder   nicotine 21 mg/24hr patch Commonly known as:  NICODERM CQ - dosed in mg/24 hours Place 1 patch (21 mg total) onto the skin daily. (May buy from over the counter): For smoking cessation What changed:  additional instructions  Indication:  Nicotine Addiction   omeprazole 20 MG capsule Commonly known as:  PRILOSEC Take 1 capsule (20 mg total) by mouth daily. For acid reflux What changed:  additional instructions  Indication:  Excess Stomach Secretions   traZODone 100 MG tablet Commonly known as:  DESYREL Take 1 tablet (100 mg total) by mouth at bedtime as needed for sleep. What changed:    medication strength  how much to take  Indication:  Trouble Sleeping      Follow-up Information    [facility] Follow up.   Contact  information: [address] [place] [address] [phone]        [facility] Follow up.   Why:  Walk in hours 8:30am-3:00pm, Monday through Friday Contact information: [address]  [place], [place] [address]  Phone: [phone]         Follow-up recommendations: Activity:  As tolerated Diet: As recommended by your primary care doctor. Keep all scheduled follow-up appointments as recommended.   Comments: Patient is instructed prior to discharge to: Take all medications as prescribed by his/her mental healthcare provider. Report any adverse effects and or reactions from the medicines to his/her outpatient provider promptly. Patient has been instructed & cautioned: To not engage in alcohol and or illegal drug use while on prescription medicines. In the event of worsening symptoms, patient is instructed to call the crisis hotline, 911 and or go to the nearest ED for appropriate evaluation and treatment of symptoms. To follow-up with his/her primary care provider for your other medical issues, concerns and or health care needs.   Signed: Armandina StammerAgnes Tanvi Gatling, NP, PMHNP, FNP-BC 09/05/2018, 2:26 PM

## 2018-09-05 NOTE — [facility] Suicide Risk Assessment (Signed)
[facility] Discharge Suicide Risk Assessment   Principal Problem: Substance abuse/issues of secondary gain/depressive symptoms reported Discharge Diagnoses: Active Problems:   Cocaine abuse (HCC)   Severe major depression with psychotic features (HCC)   Total Time spent with patient: 30 minutes  Alert and oriented and cooperative except that she will have to stay at the shelter denies wanting to harm self or others acknowledges issues of secondary gain prompting admission, no cravings tremors or withdrawal symptoms does not crave cocaine Mental Status Per Nursing Assessment::   On Admission:  Suicidal ideation indicated by patient  Demographic Factors:  Low socioeconomic status  Loss Factors: Decrease in vocational status  Historical Factors: NA  Risk Reduction Factors:   Religious beliefs about death  Continued Clinical Symptoms:  Alcohol/Substance Abuse/Dependencies  Cognitive Features That Contribute To Risk:  Closed-mindedness    Suicide Risk:  Minimal: No identifiable suicidal ideation.  Patients presenting with no risk factors but with morbid ruminations; may be classified as minimal risk based on the severity of the depressive symptoms     Follow-up Information    [facility] Follow up.   Contact information: [address] [place] [address] [phone]           Plan Of Care/Follow-up recommendations:  Activity:  full    Dana Lombardozzi, MD 09/05/2018, 8:59 AM

## 2018-09-05 NOTE — Progress Notes (Signed)
Pt discharged to lobby. Pt was stable and appreciative at that time. All papers, samples and prescriptions were given and valuables returned. Verbal understanding expressed. Denies SI/HI and A/VH. Pt given opportunity to express concerns and ask questions.  

## 2018-09-05 NOTE — Progress Notes (Signed)
Recreation Therapy Notes  Date: 1.10.19 Time: 1000 Location: 500 Hall Dayroom  Group Topic: Triggers  Goal Area(s) Addresses:  Patient will identify triggers.  Patient will verbalize benefit of avoiding triggers. Patient will verbalize ways to face triggers head on.  Behavioral Response: Engaged  Intervention: Worksheet  Activity: Triggers.  Patients were to identify their biggest triggers.  Patients were to also identify how they could avoid or reduce exposure to triggers and how they could face triggers head on when they couldn't be avoided.  Education: Triggers, Discharge Planning  Education Outcome: Acknowledges understanding/In group clarification offered/Needs additional education.   Clinical Observations/Feedback: Pt identified her triggers as money, people and environment.  Pt avoids her triggers by " staying away from the people and places that trigger my addiction" and "get someone to manage my money".  Pt expressed she faces her triggers head on by "running".      Caroll Rancher, LRT/CTRS    Lillia Abed, Arabelle Bollig A 09/05/2018 11:07 AM

## 2018-09-05 NOTE — Plan of Care (Signed)
Pt was able to identify triggers for substance use at completion of recreation therapy group sessions.    Caroll Rancher, LRT/CTRS

## 2020-08-08 ENCOUNTER — Ambulatory Visit (INDEPENDENT_AMBULATORY_CARE_PROVIDER_SITE_OTHER): Payer: Medicare Other

## 2020-08-08 ENCOUNTER — Encounter: Payer: Self-pay | Admitting: Podiatry

## 2020-08-08 ENCOUNTER — Other Ambulatory Visit: Payer: Self-pay

## 2020-08-08 ENCOUNTER — Ambulatory Visit (INDEPENDENT_AMBULATORY_CARE_PROVIDER_SITE_OTHER): Payer: Medicare Other | Admitting: Podiatry

## 2020-08-08 DIAGNOSIS — M79671 Pain in right foot: Secondary | ICD-10-CM

## 2020-08-08 DIAGNOSIS — M79672 Pain in left foot: Secondary | ICD-10-CM

## 2020-08-08 DIAGNOSIS — M722 Plantar fascial fibromatosis: Secondary | ICD-10-CM

## 2020-08-08 MED ORDER — TRIAMCINOLONE ACETONIDE 10 MG/ML IJ SUSP
10.0000 mg | Freq: Once | INTRAMUSCULAR | Status: AC
  Administered 2020-08-08: 10 mg

## 2020-08-08 NOTE — Patient Instructions (Signed)

## 2020-08-10 ENCOUNTER — Other Ambulatory Visit: Payer: Self-pay | Admitting: Podiatry

## 2020-08-10 DIAGNOSIS — M722 Plantar fascial fibromatosis: Secondary | ICD-10-CM

## 2020-08-10 NOTE — Progress Notes (Signed)
Subjective:   Patient ID: Dana Mcintosh, female   DOB: 69 y.o.   MRN: [id number]   HPI Patient presents stating that she has had problems with the heels of both feet and states that they get sore and she feels like she is walking backwards when she first gets on them in the morning and after sitting.  Patient has had long-term history of abuse and cigarette smoking along with other issues and is now doing well   Review of Systems  All other systems reviewed and are negative.       Objective:  Physical Exam Vitals and nursing note reviewed.  Constitutional:      Appearance: She is well-developed and well-nourished.  Cardiovascular:     Pulses: Intact distal pulses.  Pulmonary:     Effort: Pulmonary effort is normal.  Musculoskeletal:        General: Normal range of motion.  Skin:    General: Skin is warm.  Neurological:     Mental Status: She is alert.     Neurovascular status intact with muscle strength found to be adequate and moderate diminishment sharp dull vibratory.  I did note relative exquisite discomfort in the plantar heel bilateral with inflammation fluid of the medial band at the insertion of the tendon into the calcaneus     Assessment:  Acute plantar fasciitis with overall malnutrition and history of dependence     Plan:  H&P reviewed all conditions and x-rays.  Sterile prep done injected the plantar fascial bilateral 3 mg Kenalog 5 mg Xylocaine applied fascial brace discussed supportive shoes stretching exercises reappoint to recheck  X-rays indicate there is moderate depression of the arch no indications of stress fracture or advanced arthritic condition

## 2020-08-15 ENCOUNTER — Other Ambulatory Visit: Payer: Self-pay | Admitting: Internal Medicine

## 2020-08-15 DIAGNOSIS — E2839 Other primary ovarian failure: Secondary | ICD-10-CM

## 2020-08-25 ENCOUNTER — Ambulatory Visit
Admission: RE | Admit: 2020-08-25 | Discharge: 2020-08-25 | Disposition: A | Discharge status: Home / Self Care | Payer: Medicare Other | Source: Ambulatory Visit | Attending: Internal Medicine | Admitting: Internal Medicine

## 2020-08-25 ENCOUNTER — Other Ambulatory Visit: Payer: Self-pay

## 2020-08-25 DIAGNOSIS — E2839 Other primary ovarian failure: Secondary | ICD-10-CM

## 2020-09-05 ENCOUNTER — Ambulatory Visit: Payer: Medicare Other | Admitting: Podiatry

## 2020-09-07 ENCOUNTER — Ambulatory Visit (INDEPENDENT_AMBULATORY_CARE_PROVIDER_SITE_OTHER): Payer: Medicare Other | Admitting: Podiatry

## 2020-09-07 ENCOUNTER — Encounter: Payer: Self-pay | Admitting: Podiatry

## 2020-09-07 ENCOUNTER — Other Ambulatory Visit: Payer: Self-pay

## 2020-09-07 DIAGNOSIS — M722 Plantar fascial fibromatosis: Secondary | ICD-10-CM

## 2020-09-07 MED ORDER — TRIAMCINOLONE ACETONIDE 10 MG/ML IJ SUSP
10.0000 mg | Freq: Once | INTRAMUSCULAR | Status: AC
  Administered 2020-09-07: 10 mg

## 2020-09-07 NOTE — Progress Notes (Signed)
Subjective:   Patient ID: Dana Mcintosh, female   DOB: 70 y.o.   MRN: [id number]   HPI Patient presents stating the right heel is doing great the left heel is doing well but she is getting pain into her left arch   ROS      Objective:  Physical Exam  Neurovascular status intact with patient's right heel improved quite a bit and discomfort in the left medial band plantar fascia mid arch area     Assessment:  Improved fasciitis right mid arch fasciitis left     Plan:  Reviewed condition sterile prep left injected the mid arch 3 mg Dexasone Kenalog 5 mg Xylocaine advised on support reappoint to recheck

## 2021-09-05 ENCOUNTER — Other Ambulatory Visit: Payer: Self-pay | Admitting: Internal Medicine

## 2021-09-06 LAB — COMPLETE METABOLIC PANEL WITH GFR
AG Ratio: 1.1 (calc) (ref 1.0–2.5)
ALT: 8 U/L (ref 6–29)
AST: 19 U/L (ref 10–35)
Albumin: 3.9 g/dL (ref 3.6–5.1)
Alkaline phosphatase (APISO): 55 U/L (ref 37–153)
BUN: 11 mg/dL (ref 7–25)
CO2: 27 mmol/L (ref 20–32)
Calcium: 9.7 mg/dL (ref 8.6–10.4)
Chloride: 107 mmol/L (ref 98–110)
Creat: 0.97 mg/dL (ref 0.50–1.05)
Globulin: 3.5 g/dL (calc) (ref 1.9–3.7)
Glucose, Bld: 88 mg/dL (ref 65–99)
Potassium: 4.1 mmol/L (ref 3.5–5.3)
Sodium: 143 mmol/L (ref 135–146)
Total Bilirubin: 0.5 mg/dL (ref 0.2–1.2)
Total Protein: 7.4 g/dL (ref 6.1–8.1)
eGFR: 64 mL/min/{1.73_m2} (ref >=60)

## 2021-09-06 LAB — LIPID PANEL
Cholesterol: 147 mg/dL (ref <200)
HDL: 50 mg/dL (ref >=50)
LDL Cholesterol (Calc): 81 mg/dL (calc)
Non-HDL Cholesterol (Calc): 97 mg/dL (calc) (ref <130)
Total CHOL/HDL Ratio: 2.9 (calc) (ref <5.0)
Triglycerides: 78 mg/dL (ref <150)

## 2021-09-06 LAB — CBC
HCT: 39 % (ref 35.0–45.0)
Hemoglobin: 13.7 g/dL (ref 11.7–15.5)
MCH: 34 pg — ABNORMAL HIGH (ref 27.0–33.0)
MCHC: 35.1 g/dL (ref 32.0–36.0)
MCV: 96.8 fL (ref 80.0–100.0)
MPV: 11.5 fL (ref 7.5–12.5)
Platelets: 188 10*3/uL (ref 140–400)
RBC: 4.03 10*6/uL (ref 3.80–5.10)
RDW: 13.6 % (ref 11.0–15.0)
WBC: 6.5 10*3/uL (ref 3.8–10.8)

## 2021-09-06 LAB — VITAMIN D 25 HYDROXY (VIT D DEFICIENCY, FRACTURES): Vit D, 25-Hydroxy: 19 ng/mL — ABNORMAL LOW (ref 30–100)

## 2021-09-06 LAB — TSH: TSH: 5.07 mIU/L — ABNORMAL HIGH (ref 0.40–4.50)

## 2021-09-15 ENCOUNTER — Other Ambulatory Visit: Payer: Self-pay | Admitting: Internal Medicine

## 2021-09-15 DIAGNOSIS — E2839 Other primary ovarian failure: Secondary | ICD-10-CM

## 2022-01-18 ENCOUNTER — Other Ambulatory Visit: Payer: Self-pay | Admitting: Internal Medicine

## 2022-01-19 LAB — URINE CULTURE
MICRO NUMBER:: 13445367
SPECIMEN QUALITY:: ADEQUATE

## 2024-11-06 ENCOUNTER — Ambulatory Visit: Payer: Self-pay | Admitting: Nurse Practitioner
